# Patient Record
Sex: Female | Born: 1965
Health system: Southern US, Community
[De-identification: ages and names within clinical notes are randomized; demographics above are authoritative.]

## PROBLEM LIST (undated history)

## (undated) DIAGNOSIS — Z8719 Personal history of other diseases of the digestive system: Secondary | ICD-10-CM

## (undated) DIAGNOSIS — Z8489 Family history of other specified conditions: Secondary | ICD-10-CM

## (undated) DIAGNOSIS — Z9889 Other specified postprocedural states: Secondary | ICD-10-CM

## (undated) DIAGNOSIS — E782 Mixed hyperlipidemia: Secondary | ICD-10-CM

## (undated) DIAGNOSIS — J45909 Unspecified asthma, uncomplicated: Secondary | ICD-10-CM

## (undated) DIAGNOSIS — J189 Pneumonia, unspecified organism: Secondary | ICD-10-CM

## (undated) DIAGNOSIS — I10 Essential (primary) hypertension: Secondary | ICD-10-CM

## (undated) DIAGNOSIS — T8859XA Other complications of anesthesia, initial encounter: Secondary | ICD-10-CM

## (undated) DIAGNOSIS — J449 Chronic obstructive pulmonary disease, unspecified: Secondary | ICD-10-CM

## (undated) DIAGNOSIS — I517 Cardiomegaly: Secondary | ICD-10-CM

## (undated) DIAGNOSIS — Z85820 Personal history of malignant melanoma of skin: Secondary | ICD-10-CM

## (undated) DIAGNOSIS — G971 Other reaction to spinal and lumbar puncture: Secondary | ICD-10-CM

## (undated) DIAGNOSIS — R51 Headache: Secondary | ICD-10-CM

## (undated) DIAGNOSIS — G709 Myoneural disorder, unspecified: Secondary | ICD-10-CM

## (undated) DIAGNOSIS — M1990 Unspecified osteoarthritis, unspecified site: Secondary | ICD-10-CM

## (undated) DIAGNOSIS — T4145XA Adverse effect of unspecified anesthetic, initial encounter: Secondary | ICD-10-CM

## (undated) DIAGNOSIS — T883XXA Malignant hyperthermia due to anesthesia, initial encounter: Secondary | ICD-10-CM

## (undated) DIAGNOSIS — K219 Gastro-esophageal reflux disease without esophagitis: Secondary | ICD-10-CM

## (undated) DIAGNOSIS — R112 Nausea with vomiting, unspecified: Secondary | ICD-10-CM

## (undated) DIAGNOSIS — D649 Anemia, unspecified: Secondary | ICD-10-CM

## (undated) DIAGNOSIS — N390 Urinary tract infection, site not specified: Secondary | ICD-10-CM

## (undated) DIAGNOSIS — F419 Anxiety disorder, unspecified: Secondary | ICD-10-CM

## (undated) DIAGNOSIS — F411 Generalized anxiety disorder: Secondary | ICD-10-CM

## (undated) HISTORY — PX: POSTERIOR FUSION LUMBAR SPINE: SUR632

## (undated) HISTORY — PX: BREAST SURGERY: SHX581

## (undated) HISTORY — PX: TONSILLECTOMY: SUR1361

## (undated) HISTORY — PX: OTHER SURGICAL HISTORY: SHX169

## (undated) HISTORY — DX: Mixed hyperlipidemia: E78.2

## (undated) HISTORY — PX: BLADDER SUSPENSION: SHX72

## (undated) HISTORY — PX: TUBAL LIGATION: SHX77

## (undated) HISTORY — DX: Essential (primary) hypertension: I10

## (undated) HISTORY — DX: Generalized anxiety disorder: F41.1

## (undated) HISTORY — DX: Malignant hyperthermia due to anesthesia, initial encounter: T88.3XXA

## (undated) HISTORY — DX: Personal history of malignant melanoma of skin: Z85.820

---

## 2010-04-11 ENCOUNTER — Ambulatory Visit
Admission: RE | Admit: 2010-04-11 | Discharge: 2010-04-11 | Payer: Self-pay | Source: Home / Self Care | Attending: Thoracic Surgery | Admitting: Thoracic Surgery

## 2010-08-22 NOTE — Letter (Signed)
April 11, 2010   Kaitlyn Kuba, MD  [address]  [place], [place]  [address]   Re:  ODESTER, NILSON                 DOB:  01-08-1966   Dear Dr. Marina Goodell:   I saw the patient, this 45 year old patient has a long history of  smoking and since February of 2011, has had multiple episodes of  bronchitis and pneumonia.  She was admitted recently to [facility] with a left lingular pneumonia.  In September 2011, a CT scan  was done at [facility] which showed some nodularity in her thymus gland  and a 3-4 months thymus followup was recommended.  On the CT scan done  at [facility], there is really no mention made of the thymus  gland compared with the other CT scan that appeared to be the same there  maybe some slight nodularity, but it was really unchanged.  She quit  smoking recently and is starting to feel better and was recently been  put on prednisone which has made her some nervous and caused to increase  her appetite.  She saw Dr. Eulis Foster and was told that she had a early  stage chronic obstructive pulmonary disease.   Her medications include:  1.  Norvasc 10 mg a day.  2.  Xanax 0.5 b.i.d.  3.  Omeprazole 40 mg a day.  4.  Coreg 12.5 mg twice a day.  5.  Advair 250-50 one puff twice a day.  6.  Ventolin HFA 2 puffs every 4h..  7.  Prednisone 20 mg a day.  8.  Ferrous sulfate.  9.  Hydrochlorothiazide 12.5 mg a day.   ALLERGIES:  Codeine and amoxicillin.   FAMILY HISTORY:  Noncontributory.   SOCIAL HISTORY:  She is married, has 2 children, quit smoking 35 days  ago.  Does not drink alcohol on a regular basis.   REVIEW OF SYSTEMS:  VITAL SIGNS:  She is 181 pounds.  She is 5 feet, 2  inches.  GENERAL:  She had weight gain.  CARDIAC:  She has shortness of breath with exertion.  No angina or  atrial fibrillation.  PULMONARY:  See history of present illness.  No hemoptysis.  GI:  Reflux.  GU:  Frequent urination.  No kidney disease.  VASCULAR:   No claudication, DVT, or TIAs.  NEUROLOGICAL:  Headaches.  MUSCULOSKELETAL:  Arthritis.  PSYCHIATRIC:  Nervousness.  EYES/ENT:  No change in eyesight or hearing.  HEMATOLOGICAL:  Problems with bleeding, clotting disorders or anemia.   PHYSICAL EXAMINATION:  Vital Signs:  Her blood pressure is 125/78, pulse  78, respirations 18, sats were 98%.  Head, Eyes, Ears, Nose and Throat:  Unremarkable.  She has no supraclavicular or axillary adenopathy.  No  thyromegaly.  Chest:  Clear to auscultation and percussion.  Heart:  Regular sinus rhythm.  No murmurs.  Abdomen:  Soft.  No  hepatosplenomegaly.  Extremities:  Pulses are 2+.  There is no clubbing  or edema.  Neurologic:  She is oriented x3.  Sensory and motor intact.  Cranial nerves intact.   Ines Bloomer, M.D.  Electronically Signed   DPB/MEDQ  D:  04/11/2010  T:  04/12/2010  Job:  [id number]

## 2010-09-27 ENCOUNTER — Other Ambulatory Visit: Payer: Self-pay | Admitting: Thoracic Surgery

## 2010-09-27 DIAGNOSIS — R222 Localized swelling, mass and lump, trunk: Secondary | ICD-10-CM

## 2010-09-27 DIAGNOSIS — J9859 Other diseases of mediastinum, not elsewhere classified: Secondary | ICD-10-CM

## 2010-10-25 ENCOUNTER — Inpatient Hospital Stay: Admission: RE | Admit: 2010-10-25 | Payer: Self-pay | Source: Ambulatory Visit

## 2010-10-25 ENCOUNTER — Ambulatory Visit: Payer: Self-pay | Admitting: Thoracic Surgery

## 2010-12-27 ENCOUNTER — Other Ambulatory Visit: Payer: Self-pay | Admitting: Thoracic Surgery

## 2011-01-15 ENCOUNTER — Encounter: Payer: Self-pay | Admitting: Thoracic Surgery

## 2011-01-16 ENCOUNTER — Ambulatory Visit: Payer: BC Managed Care – PPO | Admitting: Thoracic Surgery

## 2011-01-16 ENCOUNTER — Ambulatory Visit: Admission: RE | Admit: 2011-01-16 | Payer: BC Managed Care – PPO | Source: Ambulatory Visit

## 2011-07-25 ENCOUNTER — Encounter (HOSPITAL_COMMUNITY)
Admission: RE | Admit: 2011-07-25 | Discharge: 2011-07-25 | Disposition: A | Discharge status: Home / Self Care | Payer: BC Managed Care – PPO | Source: Ambulatory Visit | Attending: Neurosurgery | Admitting: Neurosurgery

## 2011-07-25 ENCOUNTER — Encounter (HOSPITAL_COMMUNITY): Payer: Self-pay

## 2011-07-25 ENCOUNTER — Encounter (HOSPITAL_COMMUNITY)
Admission: RE | Admit: 2011-07-25 | Discharge: 2011-07-25 | Disposition: A | Discharge status: Home / Self Care | Payer: BC Managed Care – PPO | Source: Ambulatory Visit | Attending: Anesthesiology | Admitting: Anesthesiology

## 2011-07-25 ENCOUNTER — Encounter (HOSPITAL_COMMUNITY): Payer: Self-pay | Admitting: Vascular Surgery

## 2011-07-25 HISTORY — DX: Headache: R51

## 2011-07-25 HISTORY — DX: Myoneural disorder, unspecified: G70.9

## 2011-07-25 HISTORY — DX: Adverse effect of unspecified anesthetic, initial encounter: T41.45XA

## 2011-07-25 HISTORY — DX: Unspecified osteoarthritis, unspecified site: M19.90

## 2011-07-25 HISTORY — DX: Other complications of anesthesia, initial encounter: T88.59XA

## 2011-07-25 HISTORY — DX: Essential (primary) hypertension: I10

## 2011-07-25 HISTORY — DX: Other specified postprocedural states: R11.2

## 2011-07-25 HISTORY — DX: Personal history of other diseases of the digestive system: Z87.19

## 2011-07-25 HISTORY — DX: Gastro-esophageal reflux disease without esophagitis: K21.9

## 2011-07-25 HISTORY — DX: Nausea with vomiting, unspecified: Z98.890

## 2011-07-25 HISTORY — DX: Urinary tract infection, site not specified: N39.0

## 2011-07-25 HISTORY — DX: Anxiety disorder, unspecified: F41.9

## 2011-07-25 HISTORY — DX: Pneumonia, unspecified organism: J18.9

## 2011-07-25 HISTORY — DX: Cardiomegaly: I51.7

## 2011-07-25 HISTORY — DX: Anemia, unspecified: D64.9

## 2011-07-25 LAB — HCG, SERUM, QUALITATIVE: Preg, Serum: NEGATIVE

## 2011-07-25 LAB — CBC
HCT: 39.1 % (ref 36.0–46.0)
Hemoglobin: 13.1 g/dL (ref 12.0–15.0)
MCH: 28.7 pg (ref 26.0–34.0)
MCHC: 33.5 g/dL (ref 30.0–36.0)
MCV: 85.6 fL (ref 78.0–100.0)
Platelets: 342 10*3/uL (ref 150–400)
RBC: 4.57 MIL/uL (ref 3.87–5.11)
RDW: 17.3 % — ABNORMAL HIGH (ref 11.5–15.5)
WBC: 7 10*3/uL (ref 4.0–10.5)

## 2011-07-25 LAB — BASIC METABOLIC PANEL
BUN: 10 mg/dL (ref 6–23)
CO2: 22 mEq/L (ref 19–32)
Calcium: 9.9 mg/dL (ref 8.4–10.5)
Chloride: 101 mEq/L (ref 96–112)
Creatinine, Ser: 0.56 mg/dL (ref 0.50–1.10)
GFR calc Af Amer: >90 mL/min (ref >90)
GFR calc non Af Amer: >90 mL/min (ref >90)
Glucose, Bld: 89 mg/dL (ref 70–99)
Potassium: 4.5 mEq/L (ref 3.5–5.1)
Sodium: 136 mEq/L (ref 135–145)

## 2011-07-25 LAB — ABO/RH: ABO/RH(D): B NEG

## 2011-07-25 LAB — TYPE AND SCREEN
ABO/RH(D): B NEG
Antibody Screen: NEGATIVE

## 2011-07-25 LAB — SURGICAL PCR SCREEN
MRSA, PCR: NEGATIVE
Staphylococcus aureus: NEGATIVE

## 2011-07-25 NOTE — Pre-Procedure Instructions (Signed)
20 Kaitlyn Diaz  07/25/2011   Your procedure is scheduled on:  Wednesday August 01, 2011  Report to [facility] at 0630 AM.  Call this number if you have problems the morning of surgery: [phone]   Remember:   Do not eat food:After Midnight.  May have clear liquids: up to 4 Hours before arrival. (up to 2:30AM)  Clear liquids include soda, tea, black coffee, apple or grape juice, broth.  Take these medicines the morning of surgery with A SIP OF WATER: Albuterol, xanax, amlodipine, coreg, gabapentin, advair, hydrocodone, prilosec   Do not wear jewelry, make-up or nail polish.  Do not wear lotions, powders, or perfumes. You may wear deodorant.  Do not shave 48 hours prior to surgery.  Do not bring valuables to the hospital.  Contacts, dentures or bridgework may not be worn into surgery.  Leave suitcase in the car. After surgery it may be brought to your room.  For patients admitted to the hospital, checkout time is 11:00 AM the day of discharge.   Patients discharged the day of surgery will not be allowed to drive home.  Name and phone number of your driver: Anahita Cua [phone]  Special Instructions: CHG Shower Use Special Wash: 1/2 bottle night before surgery and 1/2 bottle morning of surgery.   Please read over the following fact sheets that you were given: Pain Booklet, Coughing and Deep Breathing, MRSA Information and Surgical Site Infection Prevention

## 2011-07-25 NOTE — Consult Note (Addendum)
Anesthesia:  Patient is a 46 year old female scheduled for a L4-5 PLIF on 08/01/11 @ 0830 (first case).  History includes HTN, hypertensive cardiomyopathy associated with pregnancy induced HTN/pre-eclampsia 20 years ago, anemia, GERD, headaches, arthritis, HH, anxiety, smoking, PNA (last 2011), neuropathy involving her LLE. She has had prior T&A, breast surgery, and BTL.  Her PCP is Syble Creek, ANP at [facility] in [place], who saw her for a follow-up and preoperative evaluation on 06/28/11.  Anesthesia complications include personal and family history of MALIGNANT HYPERTHERMIA and severe  post-operative N/V.  She has had a maternal uncle die from complications of MH, and both her mother and sister have a MH history.  None have been formally tested, but she said over twenty years ago she had a hard surgery with a failed block that required GA and her temperature went up to 106.  She also says that she woke up during her tonsillectomy at age 89 and during a "sling" procedure (Urologist Dr. Lindley Magnus) in [facility] in 2010.    Physical exam findings show a raspy voice, average build, heart with a RRR, no murmur noted.  Lungs clear. No carotid bruits or significant LE edema noted.  She denies CP, SOB.  She reports that about 1 year after her cardiomyopathy was diagnosed she had a follow-up echo that was "normal".  She denies CHF history, and has tolerated several surgical procedures since.  Her activity level is very limited for the past 6-12 months due to her back and leg pain.   EKG from PCP office on 06/28/11 showed NSR.  Labs acceptable.  CXR on 07/24/16 showed no acute cardiopulmonary process. She was evaluated by Pulmonologist Dr. Bethanie Dicker in December 2011.  PFTs were done and did not sure clearcut evidence of obstructive or restrictive defect.  (FEV1 2.45 with a predicted value of 93%.)  I updated Anesthesiologist Dr. Gypsy Balsam about her Anesthesia and CM history.  Plan to proceed.  I  will attempt to get her last Anesthesia records and will notify OR scheduling of her MH history.  Addendum: 07/26/11 1030  I notified Mary in [place] scheduling and Isidore Moos, CRNA of patient's history of MH.  Our Diplomatic Services operational officer has requested her last Anesthesia records from [facility].

## 2011-07-25 NOTE — Progress Notes (Signed)
Contacted Dr. Orion Crook office, requested copy of last office visit notes and EKG. [phone]. Called Dr. Gerome Apley with [facility], requested last office visit note and CXR, spoke with [facility] in medical records.

## 2011-07-26 NOTE — Anesthesia Preprocedure Evaluation (Addendum)
Anesthesia Evaluation  Patient identified by MRN, date of birth, ID band Patient awake  General Assessment Comment:Personal and family history of MH  Reviewed: Allergy & Precautions, H&P , NPO status , Patient's Chart, lab work & pertinent test results, reviewed documented beta blocker date and time   History of Anesthesia Complications (+) PONV, MALIGNANT HYPERTHERMIA and Family history of anesthesia reaction  Airway Mallampati: II TM Distance: >3 FB Neck ROM: Full    Dental  (+) Upper Dentures and Dental Advisory Given   Pulmonary pneumonia , Current Smoker,    Pulmonary exam normal       Cardiovascular hypertension, Pt. on medications and Pt. on home beta blockers     Neuro/Psych Anxiety    GI/Hepatic Neg liver ROS, hiatal hernia, GERD-  Medicated and Controlled,  Endo/Other  negative endocrine ROS  Renal/GU negative Renal ROS     Musculoskeletal negative musculoskeletal ROS (+)   Abdominal Normal abdominal exam  (+)   Peds  Hematology negative hematology ROS (+)   Anesthesia Other Findings   Reproductive/Obstetrics                         Anesthesia Physical Anesthesia Plan  ASA: III  Anesthesia Plan: General   Post-op Pain Management:    Induction: Intravenous  Airway Management Planned: Oral ETT  Additional Equipment:   Intra-op Plan:   Post-operative Plan: Extubation in OR  Informed Consent: I have reviewed the patients History and Physical, chart, labs and discussed the procedure including the risks, benefits and alternatives for the proposed anesthesia with the patient or authorized representative who has indicated his/her understanding and acceptance.   Dental advisory given  Plan Discussed with: Anesthesiologist and Surgeon  Anesthesia Plan Comments: (Pt has a grandmother and her great uncle with  A H/O MH episode. Nobody in family tested. She had and anesthetic last  year in [place] where she received Succinyl Choline and Sevoflurane without triggering MH. Plan: Avoid Succinyl Choline, IV propofol infusion. Background Sevoflurane, if needed.)       Anesthesia Quick Evaluation

## 2011-07-31 MED ORDER — VANCOMYCIN HCL 500 MG IV SOLR
500.0000 mg | Freq: Once | INTRAVENOUS | Status: AC
  Administered 2011-08-01: 500 mg via INTRAVENOUS
  Filled 2011-07-31: qty 500

## 2011-08-01 ENCOUNTER — Encounter (HOSPITAL_COMMUNITY): Payer: Self-pay | Admitting: Vascular Surgery

## 2011-08-01 ENCOUNTER — Ambulatory Visit (HOSPITAL_COMMUNITY): Payer: BC Managed Care – PPO | Admitting: Vascular Surgery

## 2011-08-01 ENCOUNTER — Inpatient Hospital Stay (HOSPITAL_COMMUNITY)
Admission: RE | Admit: 2011-08-01 | Discharge: 2011-08-05 | DRG: 755 | Disposition: A | Discharge status: Home / Self Care | Payer: BC Managed Care – PPO | Source: Ambulatory Visit | Attending: Neurosurgery | Admitting: Neurosurgery

## 2011-08-01 ENCOUNTER — Ambulatory Visit (HOSPITAL_COMMUNITY): Payer: BC Managed Care – PPO

## 2011-08-01 ENCOUNTER — Encounter (HOSPITAL_COMMUNITY): Payer: Self-pay | Admitting: *Deleted

## 2011-08-01 ENCOUNTER — Encounter (HOSPITAL_COMMUNITY)
Admission: RE | Disposition: A | Discharge status: Home / Self Care | Payer: Self-pay | Source: Ambulatory Visit | Attending: Neurosurgery

## 2011-08-01 DIAGNOSIS — M4316 Spondylolisthesis, lumbar region: Secondary | ICD-10-CM

## 2011-08-01 DIAGNOSIS — Z79899 Other long term (current) drug therapy: Secondary | ICD-10-CM

## 2011-08-01 DIAGNOSIS — F411 Generalized anxiety disorder: Secondary | ICD-10-CM | POA: Diagnosis present

## 2011-08-01 DIAGNOSIS — D649 Anemia, unspecified: Secondary | ICD-10-CM | POA: Diagnosis present

## 2011-08-01 DIAGNOSIS — F17200 Nicotine dependence, unspecified, uncomplicated: Secondary | ICD-10-CM | POA: Diagnosis present

## 2011-08-01 DIAGNOSIS — I10 Essential (primary) hypertension: Secondary | ICD-10-CM | POA: Diagnosis present

## 2011-08-01 DIAGNOSIS — Z01818 Encounter for other preprocedural examination: Secondary | ICD-10-CM

## 2011-08-01 DIAGNOSIS — M129 Arthropathy, unspecified: Secondary | ICD-10-CM | POA: Diagnosis present

## 2011-08-01 DIAGNOSIS — Q762 Congenital spondylolisthesis: Principal | ICD-10-CM

## 2011-08-01 DIAGNOSIS — E876 Hypokalemia: Secondary | ICD-10-CM | POA: Diagnosis not present

## 2011-08-01 DIAGNOSIS — K219 Gastro-esophageal reflux disease without esophagitis: Secondary | ICD-10-CM | POA: Diagnosis present

## 2011-08-01 DIAGNOSIS — K449 Diaphragmatic hernia without obstruction or gangrene: Secondary | ICD-10-CM | POA: Diagnosis present

## 2011-08-01 DIAGNOSIS — Z01812 Encounter for preprocedural laboratory examination: Secondary | ICD-10-CM

## 2011-08-01 SURGERY — POSTERIOR LUMBAR FUSION 1 LEVEL
Anesthesia: General | Site: Back | Laterality: Bilateral | Wound class: Clean

## 2011-08-01 MED ORDER — GLYCOPYRROLATE 0.2 MG/ML IJ SOLN
INTRAMUSCULAR | Status: DC | PRN
  Administered 2011-08-01: .7 mg via INTRAVENOUS

## 2011-08-01 MED ORDER — SODIUM CHLORIDE 0.9 % IR SOLN
Status: DC | PRN
  Administered 2011-08-01: 09:00:00

## 2011-08-01 MED ORDER — CEFAZOLIN SODIUM 1-5 GM-% IV SOLN: 1.0000 g | Freq: Three times a day (TID) | INTRAVENOUS | Status: DC

## 2011-08-01 MED ORDER — PHENOL 1.4 % MT LIQD: 1.0000 | OROMUCOSAL | Status: DC | PRN

## 2011-08-01 MED ORDER — ONDANSETRON HCL 4 MG/2ML IJ SOLN: 4.0000 mg | Freq: Once | INTRAMUSCULAR | Status: DC | PRN

## 2011-08-01 MED ORDER — SODIUM CHLORIDE 0.9 % IJ SOLN
3.0000 mL | Freq: Two times a day (BID) | INTRAMUSCULAR | Status: DC
  Administered 2011-08-01 – 2011-08-05 (×7): 3 mL via INTRAVENOUS

## 2011-08-01 MED ORDER — NICOTINE 14 MG/24HR TD PT24
14.0000 mg | MEDICATED_PATCH | Freq: Every day | TRANSDERMAL | Status: DC
  Administered 2011-08-01 – 2011-08-05 (×5): 14 mg via TRANSDERMAL
  Filled 2011-08-01 (×5): qty 1

## 2011-08-01 MED ORDER — LIDOCAINE HCL (CARDIAC) 20 MG/ML IV SOLN
INTRAVENOUS | Status: DC | PRN
  Administered 2011-08-01: 100 mg via INTRAVENOUS

## 2011-08-01 MED ORDER — SODIUM CHLORIDE 0.9 % IV SOLN
INTRAVENOUS | Status: AC
  Filled 2011-08-01: qty 500

## 2011-08-01 MED ORDER — PROPOFOL 10 MG/ML IV EMUL
INTRAVENOUS | Status: DC | PRN
  Administered 2011-08-01 (×5): via INTRAVENOUS
  Administered 2011-08-01: 120 ug/kg/min via INTRAVENOUS

## 2011-08-01 MED ORDER — 0.9 % SODIUM CHLORIDE (POUR BTL) OPTIME
TOPICAL | Status: DC | PRN
  Administered 2011-08-01: 1000 mL

## 2011-08-01 MED ORDER — ACETAMINOPHEN 650 MG RE SUPP: 650.0000 mg | RECTAL | Status: DC | PRN

## 2011-08-01 MED ORDER — FLUTICASONE-SALMETEROL 250-50 MCG/DOSE IN AEPB
1.0000 | INHALATION_SPRAY | Freq: Every day | RESPIRATORY_TRACT | Status: DC
  Administered 2011-08-01 – 2011-08-04 (×4): 1 via RESPIRATORY_TRACT
  Filled 2011-08-01: qty 14

## 2011-08-01 MED ORDER — MIDAZOLAM HCL 5 MG/5ML IJ SOLN
INTRAMUSCULAR | Status: DC | PRN
  Administered 2011-08-01 (×3): 2 mg via INTRAVENOUS

## 2011-08-01 MED ORDER — ACETAMINOPHEN 325 MG PO TABS: 650.0000 mg | ORAL_TABLET | ORAL | Status: DC | PRN

## 2011-08-01 MED ORDER — GABAPENTIN 300 MG PO CAPS: 600.0000 mg | ORAL_CAPSULE | Freq: Every day | ORAL | Status: DC

## 2011-08-01 MED ORDER — FENTANYL CITRATE 0.05 MG/ML IJ SOLN
INTRAMUSCULAR | Status: DC | PRN
  Administered 2011-08-01 (×2): 50 ug via INTRAVENOUS
  Administered 2011-08-01 (×2): 250 ug via INTRAVENOUS
  Administered 2011-08-01 (×2): 50 ug via INTRAVENOUS

## 2011-08-01 MED ORDER — LACTATED RINGERS IV SOLN
INTRAVENOUS | Status: DC | PRN
  Administered 2011-08-01 (×3): via INTRAVENOUS

## 2011-08-01 MED ORDER — AMLODIPINE BESYLATE 10 MG PO TABS
10.0000 mg | ORAL_TABLET | Freq: Every day | ORAL | Status: DC
  Administered 2011-08-04 – 2011-08-05 (×2): 10 mg via ORAL
  Filled 2011-08-01 (×4): qty 1

## 2011-08-01 MED ORDER — VECURONIUM BROMIDE 10 MG IV SOLR
INTRAVENOUS | Status: DC | PRN
  Administered 2011-08-01: 5 mg via INTRAVENOUS
  Administered 2011-08-01: 1 mg via INTRAVENOUS
  Administered 2011-08-01: 2 mg via INTRAVENOUS

## 2011-08-01 MED ORDER — ROCURONIUM BROMIDE 100 MG/10ML IV SOLN
INTRAVENOUS | Status: DC | PRN
  Administered 2011-08-01: 50 mg via INTRAVENOUS

## 2011-08-01 MED ORDER — HYDROMORPHONE HCL PF 1 MG/ML IJ SOLN
0.2500 mg | INTRAMUSCULAR | Status: DC | PRN
  Administered 2011-08-01 (×2): 0.5 mg via INTRAVENOUS

## 2011-08-01 MED ORDER — ALUM & MAG HYDROXIDE-SIMETH 200-200-20 MG/5ML PO SUSP: 30.0000 mL | Freq: Four times a day (QID) | ORAL | Status: DC | PRN

## 2011-08-01 MED ORDER — NEOSTIGMINE METHYLSULFATE 1 MG/ML IJ SOLN
INTRAMUSCULAR | Status: DC | PRN
  Administered 2011-08-01: 4 mg via INTRAVENOUS

## 2011-08-01 MED ORDER — ALBUTEROL SULFATE (5 MG/ML) 0.5% IN NEBU
INHALATION_SOLUTION | RESPIRATORY_TRACT | Status: AC
  Filled 2011-08-01: qty 0.5

## 2011-08-01 MED ORDER — HYDROMORPHONE HCL PF 1 MG/ML IJ SOLN
0.5000 mg | INTRAMUSCULAR | Status: DC | PRN
  Administered 2011-08-01 – 2011-08-04 (×6): 1 mg via INTRAVENOUS
  Filled 2011-08-01 (×7): qty 1

## 2011-08-01 MED ORDER — SODIUM CHLORIDE 0.9 % IJ SOLN: 3.0000 mL | INTRAMUSCULAR | Status: DC | PRN

## 2011-08-01 MED ORDER — SODIUM CHLORIDE 0.9 % IV SOLN: 250.0000 mL | INTRAVENOUS | Status: DC

## 2011-08-01 MED ORDER — ALBUTEROL SULFATE HFA 108 (90 BASE) MCG/ACT IN AERS
INHALATION_SPRAY | RESPIRATORY_TRACT | Status: DC | PRN
  Administered 2011-08-01: 2 via RESPIRATORY_TRACT

## 2011-08-01 MED ORDER — HYDROCODONE-ACETAMINOPHEN 5-325 MG PO TABS
2.0000 | ORAL_TABLET | ORAL | Status: DC | PRN
  Administered 2011-08-01 – 2011-08-05 (×14): 2 via ORAL
  Filled 2011-08-01 (×14): qty 2

## 2011-08-01 MED ORDER — CYCLOBENZAPRINE HCL 10 MG PO TABS
10.0000 mg | ORAL_TABLET | Freq: Three times a day (TID) | ORAL | Status: DC | PRN
  Administered 2011-08-01 – 2011-08-04 (×4): 10 mg via ORAL
  Filled 2011-08-01 (×7): qty 1

## 2011-08-01 MED ORDER — FERROUS SULFATE 325 (65 FE) MG PO TABS
325.0000 mg | ORAL_TABLET | Freq: Two times a day (BID) | ORAL | Status: DC
  Administered 2011-08-01 – 2011-08-05 (×8): 325 mg via ORAL
  Filled 2011-08-01 (×10): qty 1

## 2011-08-01 MED ORDER — ONDANSETRON HCL 4 MG/2ML IJ SOLN: 4.0000 mg | INTRAMUSCULAR | Status: DC | PRN

## 2011-08-01 MED ORDER — DEXAMETHASONE SODIUM PHOSPHATE 4 MG/ML IJ SOLN
INTRAMUSCULAR | Status: DC | PRN
  Administered 2011-08-01: 4 mg via INTRAVENOUS

## 2011-08-01 MED ORDER — MENTHOL 3 MG MT LOZG
1.0000 | LOZENGE | OROMUCOSAL | Status: DC | PRN
  Filled 2011-08-01: qty 9

## 2011-08-01 MED ORDER — BUPIVACAINE HCL (PF) 0.25 % IJ SOLN
INTRAMUSCULAR | Status: DC | PRN
  Administered 2011-08-01: 10 mL

## 2011-08-01 MED ORDER — LIDOCAINE-EPINEPHRINE 1 %-1:100000 IJ SOLN
INTRAMUSCULAR | Status: DC | PRN
  Administered 2011-08-01: 10 mL

## 2011-08-01 MED ORDER — HYDROCHLOROTHIAZIDE 12.5 MG PO CAPS
12.5000 mg | ORAL_CAPSULE | Freq: Every day | ORAL | Status: DC
  Administered 2011-08-04 – 2011-08-05 (×2): 12.5 mg via ORAL
  Filled 2011-08-01 (×5): qty 1

## 2011-08-01 MED ORDER — PROPOFOL 10 MG/ML IV EMUL
INTRAVENOUS | Status: DC | PRN
  Administered 2011-08-01: 150 mg via INTRAVENOUS

## 2011-08-01 MED ORDER — BACITRACIN 50000 UNITS IM SOLR
INTRAMUSCULAR | Status: AC
  Filled 2011-08-01: qty 1

## 2011-08-01 MED ORDER — MORPHINE SULFATE 2 MG/ML IJ SOLN: 0.0500 mg/kg | INTRAMUSCULAR | Status: DC | PRN

## 2011-08-01 MED ORDER — GABAPENTIN 600 MG PO TABS
600.0000 mg | ORAL_TABLET | Freq: Every day | ORAL | Status: DC
  Administered 2011-08-01 – 2011-08-04 (×4): 600 mg via ORAL
  Filled 2011-08-01 (×5): qty 1

## 2011-08-01 MED ORDER — POTASSIUM CHLORIDE IN NACL 20-0.9 MEQ/L-% IV SOLN
INTRAVENOUS | Status: DC
  Administered 2011-08-01 – 2011-08-02 (×2): via INTRAVENOUS
  Filled 2011-08-01 (×9): qty 1000

## 2011-08-01 MED ORDER — ALPRAZOLAM 0.5 MG PO TABS
1.0000 mg | ORAL_TABLET | Freq: Three times a day (TID) | ORAL | Status: DC
  Administered 2011-08-01 – 2011-08-05 (×12): 1 mg via ORAL
  Filled 2011-08-01: qty 2
  Filled 2011-08-01 (×2): qty 1
  Filled 2011-08-01: qty 2
  Filled 2011-08-01: qty 1
  Filled 2011-08-01: qty 2
  Filled 2011-08-01: qty 1
  Filled 2011-08-01 (×2): qty 2
  Filled 2011-08-01 (×2): qty 1
  Filled 2011-08-01: qty 2
  Filled 2011-08-01: qty 1
  Filled 2011-08-01: qty 2
  Filled 2011-08-01 (×3): qty 1

## 2011-08-01 MED ORDER — HYDROMORPHONE HCL PF 1 MG/ML IJ SOLN
INTRAMUSCULAR | Status: AC
  Filled 2011-08-01: qty 1

## 2011-08-01 MED ORDER — CARVEDILOL 12.5 MG PO TABS
12.5000 mg | ORAL_TABLET | Freq: Two times a day (BID) | ORAL | Status: DC
  Administered 2011-08-03 – 2011-08-05 (×4): 12.5 mg via ORAL
  Filled 2011-08-01 (×10): qty 1

## 2011-08-01 MED ORDER — DOCUSATE SODIUM 100 MG PO CAPS
100.0000 mg | ORAL_CAPSULE | Freq: Two times a day (BID) | ORAL | Status: DC
  Administered 2011-08-01 – 2011-08-05 (×8): 100 mg via ORAL
  Filled 2011-08-01 (×7): qty 1

## 2011-08-01 MED ORDER — ONDANSETRON HCL 4 MG/2ML IJ SOLN
INTRAMUSCULAR | Status: DC | PRN
  Administered 2011-08-01 (×2): 4 mg via INTRAVENOUS

## 2011-08-01 MED ORDER — ALBUTEROL SULFATE HFA 108 (90 BASE) MCG/ACT IN AERS
1.0000 | INHALATION_SPRAY | Freq: Three times a day (TID) | RESPIRATORY_TRACT | Status: DC | PRN
  Filled 2011-08-01: qty 6.7

## 2011-08-01 MED ORDER — SURGIFOAM 100 EX MISC
CUTANEOUS | Status: DC | PRN
  Administered 2011-08-01: 09:00:00 via TOPICAL

## 2011-08-01 MED ORDER — VANCOMYCIN HCL IN DEXTROSE 1-5 GM/200ML-% IV SOLN
1000.0000 mg | Freq: Once | INTRAVENOUS | Status: AC
  Administered 2011-08-01: 1000 mg via INTRAVENOUS
  Filled 2011-08-01: qty 200

## 2011-08-01 MED ORDER — PANTOPRAZOLE SODIUM 40 MG PO TBEC
40.0000 mg | DELAYED_RELEASE_TABLET | Freq: Every day | ORAL | Status: DC
Start: 2011-08-01 — End: 2011-08-05
  Administered 2011-08-01 – 2011-08-04 (×4): 40 mg via ORAL
  Filled 2011-08-01 (×3): qty 1

## 2011-08-01 SURGICAL SUPPLY — 70 items
BAG DECANTER FOR FLEXI CONT (MISCELLANEOUS) ×2 IMPLANT
BENZOIN TINCTURE PRP APPL 2/3 (GAUZE/BANDAGES/DRESSINGS) ×2 IMPLANT
BLADE SURG 11 STRL SS (BLADE) ×2 IMPLANT
BLADE SURG ROTATE 9660 (MISCELLANEOUS) IMPLANT
BRUSH SCRUB EZ PLAIN DRY (MISCELLANEOUS) ×2 IMPLANT
BUR MATCHSTICK NEURO 3.0 LAGG (BURR) ×2 IMPLANT
BUR PRECISION FLUTE 6.0 (BURR) ×2 IMPLANT
CANISTER SUCTION 2500CC (MISCELLANEOUS) ×2 IMPLANT
CAP LOCKING REVERE (Cap) ×8 IMPLANT
CLOTH BEACON ORANGE TIMEOUT ST (SAFETY) ×2 IMPLANT
CONT SPEC 4OZ CLIKSEAL STRL BL (MISCELLANEOUS) ×4 IMPLANT
COVER BACK TABLE 24X17X13 BIG (DRAPES) IMPLANT
COVER TABLE BACK 60X90 (DRAPES) ×2 IMPLANT
Crosslink  48 - 60 mm ×2 IMPLANT
DECANTER SPIKE VIAL GLASS SM (MISCELLANEOUS) ×2 IMPLANT
DERMABOND ADVANCED (GAUZE/BANDAGES/DRESSINGS) ×1
DERMABOND ADVANCED .7 DNX12 (GAUZE/BANDAGES/DRESSINGS) ×1 IMPLANT
DRAPE C-ARM 42X72 X-RAY (DRAPES) ×6 IMPLANT
DRAPE LAPAROTOMY 100X72X124 (DRAPES) ×2 IMPLANT
DRAPE POUCH INSTRU U-SHP 10X18 (DRAPES) ×2 IMPLANT
DRAPE PROXIMA HALF (DRAPES) IMPLANT
DRAPE SURG 17X23 STRL (DRAPES) ×2 IMPLANT
DRSG OPSITE 4X5.5 SM (GAUZE/BANDAGES/DRESSINGS) ×4 IMPLANT
ELECT REM PT RETURN 9FT ADLT (ELECTROSURGICAL) ×2
ELECTRODE REM PT RTRN 9FT ADLT (ELECTROSURGICAL) ×1 IMPLANT
EVACUATOR 3/16  PVC DRAIN (DRAIN) ×1
EVACUATOR 3/16 PVC DRAIN (DRAIN) ×1 IMPLANT
GAUZE SPONGE 4X4 16PLY XRAY LF (GAUZE/BANDAGES/DRESSINGS) ×2 IMPLANT
GLOVE BIO SURGEON STRL SZ8 (GLOVE) ×4 IMPLANT
GLOVE BIOGEL PI IND STRL 7.0 (GLOVE) ×1 IMPLANT
GLOVE BIOGEL PI IND STRL 8 (GLOVE) ×1 IMPLANT
GLOVE BIOGEL PI INDICATOR 7.0 (GLOVE) ×1
GLOVE BIOGEL PI INDICATOR 8 (GLOVE) ×1
GLOVE ECLIPSE 7.5 STRL STRAW (GLOVE) ×2 IMPLANT
GLOVE EXAM NITRILE LRG STRL (GLOVE) IMPLANT
GLOVE EXAM NITRILE MD LF STRL (GLOVE) ×4 IMPLANT
GLOVE EXAM NITRILE XL STR (GLOVE) IMPLANT
GLOVE EXAM NITRILE XS STR PU (GLOVE) IMPLANT
GLOVE INDICATOR 8.5 STRL (GLOVE) ×4 IMPLANT
GLOVE SS BIOGEL STRL SZ 6.5 (GLOVE) ×2 IMPLANT
GLOVE SUPERSENSE BIOGEL SZ 6.5 (GLOVE) ×2
GLOVE SURG SS PI 6.5 STRL IVOR (GLOVE) ×4 IMPLANT
GOWN BRE IMP SLV AUR LG STRL (GOWN DISPOSABLE) ×2 IMPLANT
GOWN BRE IMP SLV AUR XL STRL (GOWN DISPOSABLE) ×6 IMPLANT
GOWN STRL REIN 2XL LVL4 (GOWN DISPOSABLE) IMPLANT
KIT BASIN OR (CUSTOM PROCEDURE TRAY) ×2 IMPLANT
KIT ROOM TURNOVER OR (KITS) ×2 IMPLANT
MILL MEDIUM DISP (BLADE) ×2 IMPLANT
NEEDLE HYPO 25X1 1.5 SAFETY (NEEDLE) ×2 IMPLANT
NS IRRIG 1000ML POUR BTL (IV SOLUTION) ×2 IMPLANT
PACK LAMINECTOMY NEURO (CUSTOM PROCEDURE TRAY) ×2 IMPLANT
PAD ARMBOARD 7.5X6 YLW CONV (MISCELLANEOUS) ×6 IMPLANT
PUTTY BONE DBX 5CC MIX (Putty) ×2 IMPLANT
ROD CURVED 5.5X40MM (Rod) ×4 IMPLANT
SCREW PEDICLE 6.5X40MM (Screw) ×8 IMPLANT
SPACER CALIBER 10X22MM 11-15MM (Spacer) ×4 IMPLANT
SPONGE GAUZE 4X4 12PLY (GAUZE/BANDAGES/DRESSINGS) ×2 IMPLANT
SPONGE LAP 4X18 X RAY DECT (DISPOSABLE) IMPLANT
SPONGE SURGIFOAM ABS GEL 100 (HEMOSTASIS) ×2 IMPLANT
STRIP CLOSURE SKIN 1/2X4 (GAUZE/BANDAGES/DRESSINGS) ×2 IMPLANT
SUT VIC AB 0 CT1 18XCR BRD8 (SUTURE) ×1 IMPLANT
SUT VIC AB 0 CT1 8-18 (SUTURE) ×1
SUT VIC AB 2-0 CT1 18 (SUTURE) ×2 IMPLANT
SUT VIC AB 3-0 SH 8-18 (SUTURE) ×2 IMPLANT
SUT VICRYL 4-0 PS2 18IN ABS (SUTURE) ×2 IMPLANT
SYR 20ML ECCENTRIC (SYRINGE) ×2 IMPLANT
TOWEL OR 17X24 6PK STRL BLUE (TOWEL DISPOSABLE) ×2 IMPLANT
TOWEL OR 17X26 10 PK STRL BLUE (TOWEL DISPOSABLE) ×2 IMPLANT
TRAY FOLEY CATH 14FRSI W/METER (CATHETERS) ×2 IMPLANT
WATER STERILE IRR 1000ML POUR (IV SOLUTION) ×2 IMPLANT

## 2011-08-01 NOTE — Preoperative (Signed)
Beta Blockers   Reason not to administer Beta Blockers:Not Applicable, took coreg this am 

## 2011-08-01 NOTE — Op Note (Signed)
Preoperative diagnosis: Grade 1 spondylolisthesis and lumbar spinal stenosis L4-5 with bilateral L4 radiculopathy and L4-5 instability  Postoperative diagnosis: Same  Procedure: Gill decompressive laminectomy at L4-5 posterior lumbar interbody fusion L4-5 using a caliber expandable peek cages pedicle screw fixation L4-5 using the globus Revere 5.5 pedicle screw system posterior lateral arthrodesis L4-5 using local graft mixed with DBX, and open reduction spinal deformity L4-5 and placement of a large Hemovac drain.  Surgeon: Jillyn Hidden Zymeir Salminen  Assistant: Shirlean Kelly  Anesthesia: Gen.  EBL: Minimal  History of present illness patient is a very pleasant 46 year old female is a progress worsening back and left greater right leg pain rating at L4 and L5 nerve root pattern. She had unrelenting pain that was refractory to all forms of conservative treatment with anti-inflammatories physical therapy epidural steroid injection narcotic pain management. Imaging findings showed transitional anatomy where a call the additional segment L5-S1 with a rheumatoid disc the disc space or above at L4-5 had marked facet arthropathy diastased so the facets and severe lumbar spinal stenosis that level as well as a dynamic spondylolisthesis. Into the patient's failure conservative treatment imaging findings and progression of the operation were signed the patient as well as peri-operative Course, expectations of outcome, alternatives to surgery and she understood and agreed to proceed forward.  Operative procedure: Patient was brought to the or was induced under general anesthesia and positioned prone on the Wilson frame her back was prepped and draped in routine sterile fashion. Preoperative localize the appropriate level so after infiltration 10 cc lidocaine with epi a midline incision was made and Bovie light car was used to take down the subcutaneous tissues and subperiosteal dissections care lamina of L4 and L5  bilaterally. Interoperative X. identify the TPN pedicle of L4 and the L5 correction the L4 spinal and a complex complex was noted be markedly hypermobile so the facets were drilled down noted be markedly diastased. Pass process was removed central decompression was begun complete facetectomies were performed and the L4 and L5 nerve root were identified and skeletonized flush with the pedicles at both levels. The L4 nerve root on the left was markedly stenotic predominantly from the didn't spondylolisthesis but also a large osteophyte coming off the superior tickling facet at L5. Both he and dressed 6 of the the facet complex and interest of the L4 pedicle as needed B. drilled down to gain adequate decompression of the foramen L4. After adequate foraminotomies been performed the attention was taken to pedicle screw placement using a high-speed drill and fluoroscopy pilot holes were drilled cannulated with the awl probed A. Probe again and 6 5 x 40 screws inserted L4 and L5 bilaterally. All screws excellent purchase all pedicles were probed and using external interbody limb axis confirm no mediolateral breech. Then the disc space was coagulated and incised a size 11 distractor was inserted the patient's left side this had good apposition the endplates and filled be appropriate sizing for the graft material and has the hyperlordosis that she had I elected use the 11 mm caliber cages 10 mm wide 20 mm in length with a exaggerated lordosis the these grafts were packed disc spaces cleanout radically from the right using Epstein curettes suture rongeurs and a size 10 rotating cutter on the endplates were prepped and helddisc was removed the then the cage was inserted and expanded 4 turns which would take it to approximately 14 mm. Then the distractor was removed and fluoroscopy confirmed good position of the implant the then the  other side was prepared in a similar fashion local autograft was packed centrally and a cage was  inserted and also expanded a full 4 turns. The fluoroscopy confirmed adequate decompression expansion of the disc space was also reduced the deformity. Then AP fluoroscopy also confirmed good position of screws then aggressive decortication was care out laterally along the TPS and the remainder the local orders packed posterolaterally 40 mm rods in place and top tightness tightened down L5 the L4 screw was compressed against L5. Then 9 all the foraminal reinspected confirm no migration of graft material and confirmed Y. decompression Gelfoam was overlaid top of the dura cross-link was placed and the drain was placed the end of the wounds closed in layers with interrupted Vicryl the skin was closed with interrupted 30 vertical cracks and 3-0 Vicryl. At the end of the case all needle counts and that counts sponge counts were correct per the nurses.

## 2011-08-01 NOTE — Anesthesia Postprocedure Evaluation (Signed)
Anesthesia Post Note  Patient: Kaitlyn Diaz  Procedure(s) Performed: Procedure(s) (LRB): POSTERIOR LUMBAR FUSION 1 LEVEL (Bilateral)  Anesthesia type: general  Patient location: PACU  Post pain: Pain level controlled  Post assessment: Patient's Cardiovascular Status Stable  Last Vitals:  Filed Vitals:   08/01/11 1300  BP:   Pulse:   Temp: 36.5 C  Resp:     Post vital signs: Reviewed and stable  Level of consciousness: sedated  Complications: No apparent anesthesia complications

## 2011-08-01 NOTE — Transfer of Care (Signed)
Immediate Anesthesia Transfer of Care Note  Patient: Kaitlyn Diaz  Procedure(s) Performed: Procedure(s) (LRB): POSTERIOR LUMBAR FUSION 1 LEVEL (Bilateral)  Patient Location: PACU  Anesthesia Type: General  Level of Consciousness: patient cooperative and lethargic  Airway & Oxygen Therapy: Patient Spontanous Breathing and Patient connected to face mask oxygen  Post-op Assessment: Report given to PACU RN and Post -op Vital signs reviewed and stable  Post vital signs: Reviewed and stable  Complications: No apparent anesthesia complications

## 2011-08-01 NOTE — Anesthesia Procedure Notes (Signed)
Procedure Name: Intubation Date/Time: 08/01/2011 8:45 AM Performed by: Margaree Mackintosh Pre-anesthesia Checklist: Patient identified, Timeout performed, Emergency Drugs available, Suction available and Patient being monitored Patient Re-evaluated:Patient Re-evaluated prior to inductionOxygen Delivery Method: Circle system utilized Preoxygenation: Pre-oxygenation with 100% oxygen Intubation Type: IV induction Ventilation: Mask ventilation without difficulty and Oral airway inserted - appropriate to patient size Laryngoscope Size: Mac and 3 Grade View: Grade I Tube type: Oral Tube size: 7.0 mm Number of attempts: 1 Airway Equipment and Method: Stylet and LTA kit utilized Placement Confirmation: ETT inserted through vocal cords under direct vision,  positive ETCO2 and breath sounds checked- equal and bilateral Secured at: 20 cm Tube secured with: Tape Dental Injury: Teeth and Oropharynx as per pre-operative assessment

## 2011-08-01 NOTE — H&P (Signed)
Kaitlyn Diaz is an 46 y.o. female.   Chief Complaint: back and left leg pain HPI: A 46 year old female has had progressive worsening back and on her left leg pain is been refractory anti-inflammatories physical therapy epidural steroid injection facet blocks. 1 spondylolisthesis at L4-5 with diastased of the facets and fluid in the facet joints. A conservative treatment progression of clinical syndrome and imaging findings patient was recommended decompression stabilization procedure at L4-5 of the operation perioperative course and expectations of outcome and alternatives surgery the patient she understands and agrees to proceed forward.  Past Medical History  Diagnosis Date  . Complication of anesthesia     Difficult to put to sleep, Has run fever, "runs in famly"  . PONV (postoperative nausea and vomiting)   . Enlarged heart     hx of  . Pneumonia     hx of pneumonia x 2  . Anemia   . Urinary tract infection     and kidney infection Hx of  . GERD (gastroesophageal reflux disease)   . H/O hiatal hernia     hx of  . Headache     "due to Blood pressure"  . Neuromuscular disorder     "85 % nerve damage in left leg"  . Arthritis   . Anxiety     "panic attacks, claustophobic on xanax"  . Malignant hyperthermia   . Hypertension     Dr. Syble Creek, medical physician [phone]    Past Surgical History  Procedure Date  . Tonsillectomy     and adnoids  . Hand mass     1994, multiple masses removed - "all benign"  . Leg mass     left leg  surgically removed  . Breast surgery     left breast mass removed  . Tubal ligation   . Bladder suspension     No family history on file. Social History:  reports that she has been smoking Cigarettes.  She has a 34 pack-year smoking history. She does not have any smokeless tobacco history on file. She reports that she drinks alcohol. She reports that she does not use illicit drugs.  Allergies:  Allergies  Allergen Reactions  .  Amoxicillin Anaphylaxis, Itching and Swelling  . Codeine Other (See Comments)    nausea  . Oxycontin Other (See Comments)    hallucinations    Medications Prior to Admission  Medication Sig Dispense Refill  . albuterol (PROVENTIL HFA;VENTOLIN HFA) 108 (90 BASE) MCG/ACT inhaler Inhale 1 puff into the lungs 3 (three) times daily as needed. For shortness of breath      . ALPRAZolam (XANAX) 1 MG tablet Take 1 mg by mouth 3 (three) times daily.      Marland Kitchen amLODipine (NORVASC) 10 MG tablet Take 10 mg by mouth daily.        . carvedilol (COREG) 12.5 MG tablet Take 12.5 mg by mouth 2 (two) times daily with a meal.        . ferrous sulfate 325 (65 FE) MG tablet Take 325 mg by mouth 2 (two) times daily.       . Fluticasone-Salmeterol (ADVAIR) 250-50 MCG/DOSE AEPB Inhale 1 puff into the lungs at bedtime.       . gabapentin (NEURONTIN) 300 MG capsule Take 600 mg by mouth at bedtime.      . hydrochlorothiazide (MICROZIDE) 12.5 MG capsule Take 12.5 mg by mouth daily as needed. For swelling in hands/legs      . HYDROcodone-acetaminophen (NORCO) 5-325 MG  per tablet Take 2 tablets by mouth every 4 (four) hours as needed. For pain      . omeprazole (PRILOSEC) 40 MG capsule Take 40 mg by mouth daily.          No results found for this or any previous visit (from the past 48 hour(s)). No results found.  Review of Systems  Constitutional: Negative.   HENT: Negative.   Eyes: Negative.   Respiratory: Positive for shortness of breath.   Cardiovascular: Negative.   Gastrointestinal: Negative.   Genitourinary: Negative.   Musculoskeletal: Positive for myalgias, back pain and joint pain.  Skin: Negative.   Neurological: Positive for sensory change.    Blood pressure 128/89, pulse 73, temperature 98.1 F (36.7 C), temperature source Oral, resp. rate 18, last menstrual period 07/29/2011, SpO2 98.00%. Physical Exam  Constitutional: She is oriented to person, place, and time. She appears well-developed and  well-nourished.  HENT:  Head: Normocephalic.  Eyes: Pupils are equal, round, and reactive to light.  Neck: Neck supple.  GI: Soft.  Neurological: She is alert and oriented to person, place, and time. She has normal strength. GCS eye subscore is 4. GCS verbal subscore is 5. GCS motor subscore is 6.  Reflex Scores:      Patellar reflexes are 0 on the right side and 0 on the left side.      Achilles reflexes are 0 on the right side and 0 on the left side.       Right lower extremity is 5 out of 5 in her iliopsoas quads hamstrings gastrocs anterior tibialis and EHL foot and her left lower extremity she has quadriceps hamstrings gastrocs however she is weak in her EHL at 4 and 5 slightly weak on dorsiflexion 4+ out of 5     Assessment/Plan Present for an L4-5 posterior lumbar in by fusion risks and benefits of explained the patient she understood and agreed to proceed forward.  Kaitlyn Diaz P 08/01/2011, 8:16 AM

## 2011-08-02 ENCOUNTER — Inpatient Hospital Stay (HOSPITAL_COMMUNITY): Payer: BC Managed Care – PPO

## 2011-08-02 ENCOUNTER — Inpatient Hospital Stay (HOSPITAL_COMMUNITY): Admission: RE | Admit: 2011-08-02 | Payer: BC Managed Care – PPO | Source: Ambulatory Visit

## 2011-08-02 MED ORDER — SODIUM CHLORIDE 0.9 % IV BOLUS (SEPSIS)
500.0000 mL | Freq: Once | INTRAVENOUS | Status: AC
  Administered 2011-08-02: 500 mL via INTRAVENOUS

## 2011-08-02 NOTE — Evaluation (Signed)
Physical Therapy Evaluation Patient Details Name: Kaitlyn Diaz MRN: [id number] DOB: Apr 12, 1965 Today's Date: 08/02/2011 Time: 5621-3086 PT Time Calculation (min): 22 min  PT Assessment / Plan / Recommendation Clinical Impression  Pt presents with a medical diagnosis of PLIF along with the following impairments/deficits and therapy diagnosis listed below. Pt will benefit from skilled PT in the acute care setting in order to maximize functional mobility for a safe d/c home    PT Assessment  Patient needs continued PT services    Follow Up Recommendations  No PT follow up;Supervision - Intermittent    Equipment Recommendations  None recommended by PT    Frequency Min 5X/week    Precautions / Restrictions Precautions Precautions: Back Precaution Booklet Issued: Yes (comment) Precaution Comments: pt educated on 3/3 back precautions Required Braces or Orthoses: Spinal Brace Spinal Brace: Lumbar corset;Applied in sitting position Restrictions Weight Bearing Restrictions: No   Pertinent Vitals/Pain BP monitored throughout session. Pt with no signs/symptoms. RN aware of BP throughout treatment.      Mobility  Bed Mobility Bed Mobility: Rolling Left;Left Sidelying to Sit;Sitting - Scoot to Edge of Bed Rolling Left: 4: Min assist Left Sidelying to Sit: 4: Min assist Sitting - Scoot to Edge of Bed: 6: Modified independent (Device/Increase time) Details for Bed Mobility Assistance: VC for sequencing. Min assist through trunk. Cueing for sequencing to maintain back precautions Transfers Transfers: Sit to Stand;Stand to Sit;Stand Pivot Transfers Sit to Stand: 4: Min guard Stand to Sit: 4: Min guard Stand Pivot Transfers: 4: Min assist Details for Transfer Assistance: VC for hand placement and sequencing. Assist for stability during transfer    Exercises     PT Goals Acute Rehab PT Goals PT Goal Formulation: With patient Time For Goal Achievement: 08/09/11 Potential to Achieve  Goals: Good Pt will Roll Supine to Right Side: with modified independence PT Goal: Rolling Supine to Right Side - Progress: Goal set today Pt will Roll Supine to Left Side: with modified independence PT Goal: Rolling Supine to Left Side - Progress: Goal set today Pt will go Supine/Side to Sit: with modified independence PT Goal: Supine/Side to Sit - Progress: Goal set today Pt will go Sit to Supine/Side: with modified independence PT Goal: Sit to Supine/Side - Progress: Goal set today Pt will go Sit to Stand: with modified independence PT Goal: Sit to Stand - Progress: Goal set today Pt will go Stand to Sit: with modified independence PT Goal: Stand to Sit - Progress: Goal set today Pt will Transfer Bed to Chair/Chair to Bed: with modified independence PT Transfer Goal: Bed to Chair/Chair to Bed - Progress: Goal set today Pt will Ambulate: >150 feet;with modified independence;with least restrictive assistive device PT Goal: Ambulate - Progress: Goal set today Pt will Go Up / Down Stairs: 1-2 stairs;with min assist PT Goal: Up/Down Stairs - Progress: Goal set today  Visit Information  Assistance Needed: +1    Subjective Data      Prior Functioning  Home Living Lives With: Spouse;Daughter (19yo, 23 yo) Available Help at Discharge: Available 24 hours/day;Family;Friend(s) Type of Home: House Home Access: Stairs to enter [employer] of Steps: 2 Entrance Stairs-Rails: None Home Layout: One level Bathroom Shower/Tub: Engineer, manufacturing systems: Standard Bathroom Accessibility: Yes How Accessible: Accessible via walker Home Adaptive Equipment: Bedside commode/3-in-1;Shower chair with back;Walker - rolling Prior Function Level of Independence: Independent Able to Take Stairs?: Yes Driving: Yes Communication Communication: No difficulties    Cognition  Overall Cognitive Status: Appears within functional  limits for tasks assessed/performed Arousal/Alertness:  Awake/alert Orientation Level: Oriented X4 / Intact Behavior During Session: [facility] for tasks performed    Extremity/Trunk Assessment Right Lower Extremity Assessment RLE ROM/Strength/Tone: Within functional levels RLE Sensation: WFL - Light Touch Left Lower Extremity Assessment LLE ROM/Strength/Tone: Within functional levels LLE Sensation: WFL - Light Touch   Balance    End of Session PT - End of Session Equipment Utilized During Treatment: Gait belt;Back brace Activity Tolerance: Patient tolerated treatment well Patient left: in chair;with call bell/phone within reach Nurse Communication: Mobility status;Precautions   Milana Kidney 08/02/2011, 1:23 PM  08/02/2011 Milana Kidney DPT PAGER: [phone] OFFICE: [phone]

## 2011-08-02 NOTE — Plan of Care (Signed)
Problem: Consults Goal: Diagnosis - Spinal Surgery Outcome: Progressing Microdiscectomy     

## 2011-08-02 NOTE — Progress Notes (Signed)
Utilization review completed. Xitlally Mooneyham, RN, BSN.  08/02/11  

## 2011-08-02 NOTE — Progress Notes (Signed)
Occupational Therapy Evaluation Patient Details Name: Kaitlyn Diaz MRN: [id number] DOB: 1965-08-31 Today's Date: 08/02/2011 Time: 7829-5621 OT Time Calculation (min): 22 min  OT Assessment / Plan / Recommendation Clinical Impression  Pt s/p PLIF thus affecting PLOF. Will benefit from acute OT to address below problem list in prep for d/c home with family.    OT Assessment  Patient needs continued OT Services    Follow Up Recommendations  Supervision/Assistance - 24 hour    Equipment Recommendations  None recommended by OT    Frequency Min 2X/week    Precautions / Restrictions Precautions Precautions: Back Precaution Booklet Issued: Yes (comment) Precaution Comments: pt educated on 3/3 back precautions Required Braces or Orthoses: Spinal Brace Spinal Brace: Lumbar corset;Applied in sitting position Restrictions Weight Bearing Restrictions: No   Pertinent Vitals/Pain NA    ADL  Lower Body Bathing: Simulated;Minimal assistance Where Assessed - Lower Body Bathing: Sit to stand from bed Lower Body Dressing: Simulated;Minimal assistance Where Assessed - Lower Body Dressing: Sit to stand from bed Toilet Transfer: Simulated;Minimal assistance Toilet Transfer Method: Stand pivot Toilet Transfer Equipment: Other (comment) (chair) Equipment Used: Back brace;Gait belt Ambulation Related to ADLs: Pt declined ambulation due to breakfast arriving and wishing to sit in chair for to eat.    OT Goals Acute Rehab OT Goals OT Goal Formulation: With patient Time For Goal Achievement: 08/09/11 Potential to Achieve Goals: Good ADL Goals Pt Will Perform Grooming: with set-up;Standing at sink ADL Goal: Grooming - Progress: Goal set today Pt Will Perform Lower Body Bathing: with set-up;Sit to stand from chair;Sit to stand from bed;with adaptive equipment ADL Goal: Lower Body Bathing - Progress: Goal set today Pt Will Perform Lower Body Dressing: with set-up;Sit to stand from chair;Sit to  stand from bed;with adaptive equipment ADL Goal: Lower Body Dressing - Progress: Goal set today Pt Will Transfer to Toilet: Ambulation;with supervision;with DME;3-in-1;Maintaining back safety precautions ADL Goal: Toilet Transfer - Progress: Goal set today Pt Will Perform Toileting - Clothing Manipulation: with modified independence;Sitting on 3-in-1 or toilet;Standing ADL Goal: Toileting - Clothing Manipulation - Progress: Goal set today Pt Will Perform Toileting - Hygiene: with modified independence;Standing at 3-in-1/toilet;Sit to stand from 3-in-1/toilet ADL Goal: Toileting - Hygiene - Progress: Goal set today Pt Will Perform Tub/Shower Transfer: Tub transfer;with supervision;Ambulation;Shower seat with back;Maintaining back safety precautions ADL Goal: Web designer - Progress: Goal set today Miscellaneous OT Goals Miscellaneous OT Goal #1: Pt will perform bed mobility with mod I in prep for EOB ADLs OT Goal: Miscellaneous Goal #1 - Progress: Goal set today  Visit Information  Assistance Needed: +1 PT/OT Co-Evaluation/Treatment: Yes    Subjective Data      Prior Functioning  Home Living Lives With: Spouse;Daughter (19yo, 65 yo) Available Help at Discharge: Available 24 hours/day;Family;Friend(s) Type of Home: House Home Access: Stairs to enter [employer] of Steps: 2 Entrance Stairs-Rails: None Home Layout: One level Bathroom Shower/Tub: Engineer, manufacturing systems: Standard Bathroom Accessibility: Yes How Accessible: Accessible via walker Home Adaptive Equipment: Bedside commode/3-in-1;Shower chair with back;Walker - rolling Prior Function Level of Independence: Independent Able to Take Stairs?: Yes Driving: Yes Communication Communication: No difficulties    Cognition  Overall Cognitive Status: Appears within functional limits for tasks assessed/performed Arousal/Alertness: Awake/alert Orientation Level: Oriented X4 / Intact Behavior During  Session: [facility] for tasks performed    Extremity/Trunk Assessment Right Upper Extremity Assessment RUE ROM/Strength/Tone: Within functional levels RUE Sensation: WFL - Light Touch;WFL - Proprioception RUE Coordination: WFL - gross/fine motor Left Upper  Extremity Assessment LUE ROM/Strength/Tone: Within functional levels LUE Sensation: WFL - Light Touch;WFL - Proprioception LUE Coordination: WFL - gross/fine motor Right Lower Extremity Assessment RLE ROM/Strength/Tone: Within functional levels RLE Sensation: WFL - Light Touch Left Lower Extremity Assessment LLE ROM/Strength/Tone: Within functional levels LLE Sensation: WFL - Light Touch   Mobility Bed Mobility Bed Mobility: Rolling Left;Left Sidelying to Sit;Sitting - Scoot to Edge of Bed Rolling Left: 4: Min assist Left Sidelying to Sit: 4: Min assist Sitting - Scoot to Edge of Bed: 6: Modified independent (Device/Increase time) Details for Bed Mobility Assistance: VC for sequencing. Min assist through trunk. Cueing for sequencing to maintain back precautions Transfers Sit to Stand: 4: Min guard Stand to Sit: 4: Min guard Details for Transfer Assistance: VC for hand placement and sequencing. Assist for stability during transfer   Exercise    Balance    End of Session OT - End of Session Equipment Utilized During Treatment: Gait belt;Back brace Activity Tolerance: Patient tolerated treatment well Patient left: in chair;with call bell/phone within reach Nurse Communication: Mobility status  08/02/2011 Cipriano Mile OTR/L Pager [phone] Office [phone]  Cipriano Mile 08/02/2011, 3:43 PM

## 2011-08-02 NOTE — Progress Notes (Signed)
Subjective:  Patient reports That she's feeling okay this morning no leg pain pain is all localized to her incision around her low back had some difficulty getting her pain medication because her blood pressure is been low or no new numbness or tingling. He is not passing gas yet.  Objective: Vital signs in last 24 hours: Temp:  [97 F (36.1 C)-99.3 F (37.4 C)] 98 F (36.7 C) (04/25 0700) Pulse Rate:  [67-91] 80  (04/25 0600) Resp:  [10-25] 13  (04/25 0600) BP: (97-132)/(54-118) 104/61 mmHg (04/25 0600) SpO2:  [89 %-99 %] 99 % (04/25 0600) Weight:  [81 kg (178 lb 9.2 oz)] 81 kg (178 lb 9.2 oz) (04/24 1500)  Intake/Output from previous day: 04/24 0701 - 04/25 0700 In: 4958.8 [P.O.:1680; I.V.:3078.8; IV Piggyback:200] Out: 5160 [Urine:4750; Drains:210; Blood:200] Intake/Output this shift:    Strength is 5 out of right looks very left lower extremity has weakness in dorsiflexion of the left foot at 4/5 that is her baseline wound is clean and dry  Lab Results: No results found for this basename: WBC:2,HGB:2,HCT:2,PLT:2 in the last 72 hours BMET No results found for this basename: NA:2,K:2,CL:2,CO2:2,GLUCOSE:2,BUN:2,CREATININE:2,CALCIUM:2 in the last 72 hours  Studies/Results: Dg Lumbar Spine 2-3 Views  08/01/2011  *RADIOLOGY REPORT*  Clinical Data: L4-5 fusion  LUMBAR SPINE - 2-3 VIEW,C-ARM 61-120 MIN  Comparison: None.  Findings: AP and lateral intraoperative fluoroscopic spot images are submitted.  The patient is status post L4-5 PLIF.  Metallic spacers and bone graft are in place following discectomy. Bilateral pedicle screws are in place with rod fixation.  A cross bar is evident.  IMPRESSION: Status post L4-5 PLIF without radiographic evidence for complication.  Original Report Authenticated By: Jamesetta Orleans. MATTERN, M.D.   Dg C-arm 61-120 Min  08/01/2011  *RADIOLOGY REPORT*  Clinical Data: L4-5 fusion  LUMBAR SPINE - 2-3 VIEW,C-ARM 61-120 MIN  Comparison: None.  Findings: AP  and lateral intraoperative fluoroscopic spot images are submitted.  The patient is status post L4-5 PLIF.  Metallic spacers and bone graft are in place following discectomy. Bilateral pedicle screws are in place with rod fixation.  A cross bar is evident.  IMPRESSION: Status post L4-5 PLIF without radiographic evidence for complication.  Original Report Authenticated By: Jamesetta Orleans. MATTERN, M.D.    Assessment/Plan: Postop day 1 from a pleasant overall doing very well however mildly hypotensive with systolic pressures in the high 90s low 100s will give her a fluid bolus and watch her here in the ICU until her blood pressure stabilizes we'll progressively mobilize with physical therapy.  LOS: 1 day     Marillyn Goren P 08/02/2011, 8:02 AM

## 2011-08-03 LAB — BASIC METABOLIC PANEL
BUN: <3 mg/dL — ABNORMAL LOW (ref 6–23)
CO2: 25 mEq/L (ref 19–32)
Calcium: 8.7 mg/dL (ref 8.4–10.5)
Chloride: 99 mEq/L (ref 96–112)
Creatinine, Ser: 0.4 mg/dL — ABNORMAL LOW (ref 0.50–1.10)
GFR calc Af Amer: >90 mL/min (ref >90)
GFR calc non Af Amer: >90 mL/min (ref >90)
Glucose, Bld: 149 mg/dL — ABNORMAL HIGH (ref 70–99)
Potassium: 2.9 mEq/L — ABNORMAL LOW (ref 3.5–5.1)
Sodium: 136 mEq/L (ref 135–145)

## 2011-08-03 MED ORDER — POLYETHYLENE GLYCOL 3350 17 G PO PACK
17.0000 g | PACK | Freq: Every day | ORAL | Status: DC
  Administered 2011-08-03 – 2011-08-05 (×3): 17 g via ORAL
  Filled 2011-08-03 (×3): qty 1

## 2011-08-03 MED FILL — Heparin Sodium (Porcine) Inj 1000 Unit/ML: INTRAMUSCULAR | Qty: 30 | Status: AC

## 2011-08-03 MED FILL — Sodium Chloride IV Soln 0.9%: INTRAVENOUS | Qty: 1000 | Status: AC

## 2011-08-03 NOTE — Progress Notes (Signed)
Physical Therapy Treatment Patient Details Name: Kaitlyn Diaz MRN: [id number] DOB: 06/24/1965 Today's Date: 08/03/2011 Time: 4098-1191; 4782-9562 PT Time Calculation (min): 13 min  PT Assessment / Plan / Recommendation Comments on Treatment Session  Assisted pt on and off toilet with minguard assist only. Following toileting, pt had visitors arrive and requested to come back to complete ambulation. On 2nd attempt, pts family was in the room and pt did not want to ambulate as she stated that she just completed a long ambulation with the nurse. Educated pt and family on safety at home and ways to maintain back precautions when d/c. Plan to attempt stairs next treatment    Follow Up Recommendations  No PT follow up;Supervision - Intermittent    Equipment Recommendations  None recommended by PT;None recommended by OT    Frequency Min 5X/week   Plan Discharge plan remains appropriate;Frequency remains appropriate    Precautions / Restrictions Precautions Precautions: Back Precaution Booklet Issued: Yes (comment) Precaution Comments: pt educated on 3/3 back precautions Required Braces or Orthoses: Spinal Brace Spinal Brace: Lumbar corset;Applied in sitting position Restrictions Weight Bearing Restrictions: No       Mobility  Transfers Transfers: Sit to Stand;Stand to Sit;Stand Pivot Transfers Sit to Stand: 4: Min guard;With upper extremity assist;From toilet Stand to Sit: 4: Min guard;With upper extremity assist;To toilet Details for Transfer Assistance: VC for proper hand placement and anterior translation to maintain back precautions upon standing Ambulation/Gait Ambulation/Gait Assistance: 4: Min guard Ambulation Distance (Feet): 20 Feet Assistive device: None Ambulation/Gait Assistance Details: VC for proper sequencing and posture throughout treatment. Pt declined longer ambulation after using the bathroom.  Gait Pattern: Step-to pattern;Decreased hip/knee flexion -  left;Decreased stride length;Decreased hip/knee flexion - right;Trunk flexed Gait velocity: decreased gait speed        PT Goals Acute Rehab PT Goals PT Goal Formulation: With patient PT Goal: Sit to Stand - Progress: Progressing toward goal PT Goal: Stand to Sit - Progress: Progressing toward goal PT Transfer Goal: Bed to Chair/Chair to Bed - Progress: Progressing toward goal PT Goal: Ambulate - Progress: Progressing toward goal  Visit Information  Last PT Received On: 08/03/11 Assistance Needed: +1    Subjective Data      Cognition  Overall Cognitive Status: Appears within functional limits for tasks assessed/performed Arousal/Alertness: Awake/alert Orientation Level: Oriented X4 / Intact Behavior During Session: [facility] for tasks performed       End of Session PT - End of Session Equipment Utilized During Treatment: Gait belt;Back brace Activity Tolerance: Patient tolerated treatment well Patient left: in chair;with call bell/phone within reach;with family/visitor present Nurse Communication: Mobility status;Precautions    Milana Kidney 08/03/2011, 5:28 PM  08/03/2011 Milana Kidney DPT PAGER: [phone] OFFICE: [phone]

## 2011-08-03 NOTE — Progress Notes (Signed)
OT Cancellation Note  Treatment cancelled today due to pt's refusal to participate.  Pt declining OT at this time as she had just returned to bed and c/o stomach discomfort.  Will re-attempt as time allows.  08/03/2011 Cipriano Mile OTR/L Pager [phone] Office [phone]

## 2011-08-03 NOTE — Progress Notes (Signed)
CSW received consult for SNF. PT is not recommending follow up. RNCM is aware. CSW is signing off as no further discharge social work needs identified. Please reconsult if a need arises prior to discharge.   Dede Query, MSW, Theresia Majors [phone]

## 2011-08-03 NOTE — Progress Notes (Signed)
Chart reviewed and noted that HHPT and HHOT not recommended by PT/OT evals.  No DME recommended.  Johny Shock RN MPH

## 2011-08-03 NOTE — Progress Notes (Signed)
Subjective: Patient reports Still better she started past we'll gas she's having no leg pain the movement strength in her left foot is improved  Objective: Vital signs in last 24 hours: Temp:  [97.6 F (36.4 C)-98.7 F (37.1 C)] 98.7 F (37.1 C) (04/26 0700) Pulse Rate:  [78-94] 83  (04/26 1000) Resp:  [15-24] 19  (04/26 0900) BP: (101-139)/(57-106) 109/79 mmHg (04/26 1000) SpO2:  [93 %-98 %] 96 % (04/26 1000)  Intake/Output from previous day: 04/25 0701 - 04/26 0700 In: 3755 [P.O.:2280; I.V.:975; IV Piggyback:500] Out: 5745 [Urine:5550; Drains:195] Intake/Output this shift: Total I/O In: 363 [P.O.:360; I.V.:3] Out: 500 [Urine:500]  Strength is now 4+ out of 5 in his gait EHL and dorsiflexion the left foot otherwise out of 5 the wound is clean and dry  Lab Results: No results found for this basename: WBC:2,HGB:2,HCT:2,PLT:2 in the last 72 hours BMET No results found for this basename: NA:2,K:2,CL:2,CO2:2,GLUCOSE:2,BUN:2,CREATININE:2,CALCIUM:2 in the last 72 hours  Studies/Results: Dg Lumbar Spine 2-3 Views  08/01/2011  *RADIOLOGY REPORT*  Clinical Data: L4-5 fusion  LUMBAR SPINE - 2-3 VIEW,C-ARM 61-120 MIN  Comparison: None.  Findings: AP and lateral intraoperative fluoroscopic spot images are submitted.  The patient is status post L4-5 PLIF.  Metallic spacers and bone graft are in place following discectomy. Bilateral pedicle screws are in place with rod fixation.  A cross bar is evident.  IMPRESSION: Status post L4-5 PLIF without radiographic evidence for complication.  Original Report Authenticated By: Jamesetta Orleans. MATTERN, M.D.   Dg Abd Portable 1v  08/02/2011  *RADIOLOGY REPORT*  Clinical Data: Abdominal pain and swelling  PORTABLE ABDOMEN - 1 VIEW  Comparison: 08/01/2011 intraoperative imaging  Findings: Lumbar fusion hardware noted.  No significant change allowing for technique.  Catheter tubing overlies the left mid abdomen.  No dilated gas filled loop of bowel.  Mild  gaseous prominence of small and large bowel and stomach. Presence or absence of air fluid levels or free air cannot be assessed on this single supine view.  IMPRESSION: Mild gaseous prominence of the stomach, colon, and small bowel, which may suggest mild / early ileus but is nonspecific.  Original Report Authenticated By: Harrel Lemon, M.D.   Dg C-arm 61-120 Min  08/01/2011  *RADIOLOGY REPORT*  Clinical Data: L4-5 fusion  LUMBAR SPINE - 2-3 VIEW,C-ARM 61-120 MIN  Comparison: None.  Findings: AP and lateral intraoperative fluoroscopic spot images are submitted.  The patient is status post L4-5 PLIF.  Metallic spacers and bone graft are in place following discectomy. Bilateral pedicle screws are in place with rod fixation.  A cross bar is evident.  IMPRESSION: Status post L4-5 PLIF without radiographic evidence for complication.  Original Report Authenticated By: Jamesetta Orleans. MATTERN, M.D.    Assessment/Plan: Aggressive mobilization physical therapy will at her transfer into the floor will start her about her to now she is passing gas  LOS: 2 days     Cha Gomillion P 08/03/2011, 10:36 AM

## 2011-08-04 MED ORDER — NYSTATIN 100000 UNIT/ML MT SUSP
5.0000 mL | Freq: Four times a day (QID) | OROMUCOSAL | Status: DC
  Administered 2011-08-05 (×2): 500000 [IU] via ORAL
  Filled 2011-08-04 (×5): qty 5

## 2011-08-04 MED ORDER — HYDROMORPHONE HCL 2 MG PO TABS
2.0000 mg | ORAL_TABLET | ORAL | Status: DC | PRN
  Administered 2011-08-04 – 2011-08-05 (×4): 2 mg via ORAL
  Filled 2011-08-04 (×4): qty 1

## 2011-08-04 MED ORDER — HYDROCODONE-ACETAMINOPHEN 10-325 MG PO TABS
1.0000 | ORAL_TABLET | ORAL | Status: DC | PRN
  Administered 2011-08-04: 2 via ORAL
  Administered 2011-08-05: 1 via ORAL
  Administered 2011-08-05 (×2): 2 via ORAL
  Filled 2011-08-04 (×4): qty 2

## 2011-08-04 NOTE — Progress Notes (Signed)
Subjective: Patient reports "I lay in bed and peed on myself.  Nobody came when I called. I've been in terrible pain."  Objective: Vital signs in last 24 hours: Temp:  [98 F (36.7 C)-99.1 F (37.3 C)] 98.5 F (36.9 C) (04/27 1020) Pulse Rate:  [85-98] 97  (04/27 1020) Resp:  [18-20] 18  (04/27 1020) BP: (114-139)/(75-89) 130/89 mmHg (04/27 1020) SpO2:  [92 %-100 %] 94 % (04/27 1020)  Intake/Output from previous day: 04/26 0701 - 04/27 0700 In: 1203 [P.O.:1200; I.V.:3] Out: 540 [Urine:500; Drains:40] Intake/Output this shift:    Physical Exam: Up in chair in brace.  No leg pain. Hemovac still in place.  Lab Results: No results found for this basename: WBC:2,HGB:2,HCT:2,PLT:2 in the last 72 hours BMET  [facility] 08/03/11 1721  NA 136  K 2.9*  CL 99  CO2 25  GLUCOSE 149*  BUN <3*  CREATININE 0.40*  CALCIUM 8.7    Studies/Results: Dg Abd Portable 1v  08/02/2011  *RADIOLOGY REPORT*  Clinical Data: Abdominal pain and swelling  PORTABLE ABDOMEN - 1 VIEW  Comparison: 08/01/2011 intraoperative imaging  Findings: Lumbar fusion hardware noted.  No significant change allowing for technique.  Catheter tubing overlies the left mid abdomen.  No dilated gas filled loop of bowel.  Mild gaseous prominence of small and large bowel and stomach. Presence or absence of air fluid levels or free air cannot be assessed on this single supine view.  IMPRESSION: Mild gaseous prominence of the stomach, colon, and small bowel, which may suggest mild / early ileus but is nonspecific.  Original Report Authenticated By: Harrel Lemon, M.D.    Assessment/Plan: D/C Hemovac.  Will help pt move bowels. Will determine whether dilaudid or increased strength hydrocodone will work better for pain control at home.    LOS: 3 days    Dorian Heckle, MD 08/04/2011, 12:44 PM

## 2011-08-04 NOTE — Progress Notes (Signed)
Physical Therapy Treatment Patient Details Name: Kaitlyn Diaz MRN: [id number] DOB: 10-Apr-1965 Today's Date: 08/04/2011 Time: 1324-4010 PT Time Calculation (min): 14 min  PT Assessment / Plan / Recommendation Comments on Treatment Session  Pt still limited by pain although progressing well functionally. Pt is near baseline. Supervision still required for safety due to antalgic gait. Recommended RW at home when pt feels necessary, pt is safe without AD.    Follow Up Recommendations  No PT follow up;Supervision - Intermittent    Equipment Recommendations  None recommended by PT;None recommended by OT    Frequency Min 5X/week   Plan Discharge plan remains appropriate;Frequency remains appropriate    Precautions / Restrictions Precautions Precautions: Back Precaution Booklet Issued: Yes (comment) Precaution Comments: pt able to verbalize 2/3 back precautions. Reeducated on 3 with cueing throughout treatment Required Braces or Orthoses: Spinal Brace Spinal Brace: Lumbar corset;Applied in sitting position Restrictions Weight Bearing Restrictions: No       Mobility  Transfers Transfers: Sit to Stand;Stand to Sit;Stand Pivot Transfers Sit to Stand: With upper extremity assist;5: Supervision;From chair/3-in-1 Stand to Sit: With upper extremity assist;5: Supervision;To chair/3-in-1 Details for Transfer Assistance: VC for hand placement for a safe stand Ambulation/Gait Ambulation/Gait Assistance: 5: Supervision Ambulation Distance (Feet): 200 Feet Assistive device: None Ambulation/Gait Assistance Details: Supervision for safety throughout ambulation. Pt with antalgic gait although required no AD or assistance. Gait Pattern: Step-to pattern;Decreased hip/knee flexion - left;Decreased stride length;Decreased hip/knee flexion - right;Trunk flexed Gait velocity: decreased gait speed Stairs: Yes Stairs Assistance: 5: Supervision Stairs Assistance Details (indicate cue type and reason): Pt  performed 2 stairs with and without railings. No difficulty. Stair Management Technique: No rails;One rail Right;Forwards;Step to pattern Number of Stairs: 2     Exercises     PT Goals Acute Rehab PT Goals PT Goal Formulation: With patient PT Goal: Sit to Stand - Progress: Progressing toward goal PT Goal: Stand to Sit - Progress: Progressing toward goal PT Transfer Goal: Bed to Chair/Chair to Bed - Progress: Progressing toward goal PT Goal: Ambulate - Progress: Progressing toward goal PT Goal: Up/Down Stairs - Progress: Met  Visit Information  Last PT Received On: 08/04/11    Subjective Data  Subjective: "my hip hurts more than the back"   Cognition  Overall Cognitive Status: Appears within functional limits for tasks assessed/performed Arousal/Alertness: Awake/alert Orientation Level: Oriented X4 / Intact Behavior During Session: [facility] for tasks performed    Balance     End of Session PT - End of Session Equipment Utilized During Treatment: Gait belt;Back brace Activity Tolerance: Patient tolerated treatment well Patient left: in chair;with call bell/phone within reach;with family/visitor present Nurse Communication: Mobility status;Precautions    Milana Kidney 08/04/2011, 4:17 PM  08/04/2011 Milana Kidney DPT PAGER: [phone] OFFICE: [phone]

## 2011-08-04 NOTE — Progress Notes (Signed)
OT Cancellation Note  Treatment cancelled today due to pt. just getting visitors will Re-attempt as time allows.Marland Kitchen  Tudor Chandley, OTR/L Pager [phone] 08/04/2011, 1:00 PM

## 2011-08-05 MED ORDER — NYSTATIN 100000 UNIT/ML MT SUSP: 5.0000 mL | Freq: Four times a day (QID) | OROMUCOSAL | Status: AC

## 2011-08-05 MED ORDER — CYCLOBENZAPRINE HCL 10 MG PO TABS: 10.0000 mg | ORAL_TABLET | Freq: Three times a day (TID) | ORAL | Status: AC | PRN

## 2011-08-05 MED ORDER — HYDROMORPHONE HCL 2 MG PO TABS: 2.0000 mg | ORAL_TABLET | ORAL | Status: AC | PRN

## 2011-08-05 MED ORDER — POTASSIUM CHLORIDE ER 10 MEQ PO TBCR: 20.0000 meq | EXTENDED_RELEASE_TABLET | Freq: Two times a day (BID) | ORAL | Status: DC

## 2011-08-05 NOTE — Discharge Summary (Addendum)
Physician Discharge Summary  Patient ID: Kaitlyn Diaz MRN: [id number] DOB/AGE: 1965-09-08 46 y.o.  Admit date: 08/01/2011 Discharge date: 08/05/2011  Admission Diagnoses: Spondylolisthesis L4-L5 with stenosis and radiculopathy  Discharge Diagnoses: Spondylolisthesis L4-L5 with stenosis and radiculopathy hypokalemia Active Problems:  * No active hospital problems. *    Discharged Condition: good  Hospital Course: Patient was admitted to undergo surgical decompression of a spondylolisthesis with severe stenosis at L4-L5. She tolerated the procedure well. She is very slow moving and first complaining of significant back pain. Gradually this improved. She is discharged home. At time of discharge it was noted that her potassium had gone down to 2.9. She is given a prescription for replacement.  Consults: None  Significant Diagnostic Studies: None  Treatments: surgery: Bilateral decompression L4-L5 with posterior lumbar interbody arthrodesis nonsegmental fixation L4-L5 with posterior lateral arthrodesis L4-L5  Discharge Exam: Blood pressure 112/77, pulse 91, temperature 98.3 F (36.8 C), temperature source Oral, resp. rate 18, height 5\' 2"  (1.575 m), weight 81 kg (178 lb 9.2 oz), last menstrual period 07/29/2011, SpO2 96.00%. Incision is clean and dry motor function in lower extremities is intact  Disposition: Discharge home  Discharge Orders    Future Orders Please Complete By Expires   Diet - low sodium heart healthy      Increase activity slowly      Discharge instructions      Comments:   Sit straight walk straight stand straight mind your posture. Okay to shower. Walk as much as tolerated.   Call MD for:  redness, tenderness, or signs of infection (pain, swelling, redness, odor or green/yellow discharge around incision site)      Call MD for:  severe uncontrolled pain      Call MD for:  temperature >100.4        Medication List  As of 08/05/2011 12:25 PM   TAKE these  medications         albuterol 108 (90 BASE) MCG/ACT inhaler   Commonly known as: PROVENTIL HFA;VENTOLIN HFA   Inhale 1 puff into the lungs 3 (three) times daily as needed. For shortness of breath      ALPRAZolam 1 MG tablet   Commonly known as: XANAX   Take 1 mg by mouth 3 (three) times daily.      amLODipine 10 MG tablet   Commonly known as: NORVASC   Take 10 mg by mouth daily.      carvedilol 12.5 MG tablet   Commonly known as: COREG   Take 12.5 mg by mouth 2 (two) times daily with a meal.      ferrous sulfate 325 (65 FE) MG tablet   Take 325 mg by mouth 2 (two) times daily.      Fluticasone-Salmeterol 250-50 MCG/DOSE Aepb   Commonly known as: ADVAIR   Inhale 1 puff into the lungs at bedtime.      gabapentin 300 MG capsule   Commonly known as: NEURONTIN   Take 600 mg by mouth at bedtime.      hydrochlorothiazide 12.5 MG capsule   Commonly known as: MICROZIDE   Take 12.5 mg by mouth daily as needed. For swelling in hands/legs      HYDROcodone-acetaminophen 5-325 MG per tablet   Commonly known as: NORCO   Take 2 tablets by mouth every 4 (four) hours as needed. For pain      HYDROmorphone 2 MG tablet   Commonly known as: DILAUDID   Take 1 tablet (2 mg total) by mouth  every 3 (three) hours as needed for pain.      omeprazole 40 MG capsule   Commonly known as: PRILOSEC   Take 40 mg by mouth daily.             SignedStefani Dama 08/05/2011, 12:25 PM

## 2011-08-05 NOTE — Plan of Care (Signed)
Problem: Consults Goal: Diagnosis - Spinal Surgery Outcome: Completed/Met Date Met:  08/05/11 PLIF L3-L4

## 2011-08-05 NOTE — Progress Notes (Signed)
Patient appears to have "thrush" in her mouth.  Called Dr. Venetia Maxon who ordered course of nystatin.

## 2011-08-30 ENCOUNTER — Ambulatory Visit (HOSPITAL_COMMUNITY): Payer: BC Managed Care – PPO

## 2011-08-31 ENCOUNTER — Ambulatory Visit (HOSPITAL_COMMUNITY)
Admission: RE | Admit: 2011-08-31 | Discharge: 2011-08-31 | Disposition: A | Discharge status: Home / Self Care | Payer: BC Managed Care – PPO | Source: Ambulatory Visit | Attending: Neurosurgery | Admitting: Neurosurgery

## 2011-08-31 ENCOUNTER — Other Ambulatory Visit: Payer: Self-pay | Admitting: Neurosurgery

## 2011-08-31 DIAGNOSIS — M545 Low back pain, unspecified: Secondary | ICD-10-CM

## 2011-08-31 DIAGNOSIS — M79609 Pain in unspecified limb: Secondary | ICD-10-CM | POA: Insufficient documentation

## 2011-08-31 DIAGNOSIS — M7989 Other specified soft tissue disorders: Secondary | ICD-10-CM

## 2011-08-31 DIAGNOSIS — M79606 Pain in leg, unspecified: Secondary | ICD-10-CM

## 2011-08-31 LAB — DIFFERENTIAL
Basophils Absolute: 0 10*3/uL (ref 0.0–0.1)
Basophils Relative: 0 % (ref 0–1)
Eosinophils Absolute: 0.1 10*3/uL (ref 0.0–0.7)
Eosinophils Relative: 1 % (ref 0–5)
Lymphocytes Relative: 5 % — ABNORMAL LOW (ref 12–46)
Lymphs Abs: 0.5 10*3/uL — ABNORMAL LOW (ref 0.7–4.0)
Monocytes Absolute: 0.6 10*3/uL (ref 0.1–1.0)
Monocytes Relative: 7 % (ref 3–12)
Neutro Abs: 8.1 10*3/uL — ABNORMAL HIGH (ref 1.7–7.7)
Neutrophils Relative %: 88 % — ABNORMAL HIGH (ref 43–77)

## 2011-08-31 LAB — CBC
HCT: 37.3 % (ref 36.0–46.0)
Hemoglobin: 12.6 g/dL (ref 12.0–15.0)
MCH: 29.3 pg (ref 26.0–34.0)
MCHC: 33.8 g/dL (ref 30.0–36.0)
MCV: 86.7 fL (ref 78.0–100.0)
Platelets: 288 10*3/uL (ref 150–400)
RBC: 4.3 MIL/uL (ref 3.87–5.11)
RDW: 16.4 % — ABNORMAL HIGH (ref 11.5–15.5)
WBC: 9.2 10*3/uL (ref 4.0–10.5)

## 2011-08-31 LAB — PROCALCITONIN: Procalcitonin: 0.77 ng/mL

## 2011-08-31 LAB — C-REACTIVE PROTEIN: CRP: 0.61 mg/dL — ABNORMAL HIGH (ref <0.60)

## 2011-08-31 LAB — SEDIMENTATION RATE: Sed Rate: 7 mm/hr (ref 0–22)

## 2011-08-31 NOTE — Progress Notes (Signed)
Bilateral lower extremity venous duplex completed.  Preliminary report is negative for DVT, SVT, or a Baker's cyst. 

## 2011-09-06 ENCOUNTER — Ambulatory Visit
Admission: RE | Admit: 2011-09-06 | Discharge: 2011-09-06 | Disposition: A | Discharge status: Home / Self Care | Payer: BC Managed Care – PPO | Source: Ambulatory Visit | Attending: Neurosurgery | Admitting: Neurosurgery

## 2011-09-06 DIAGNOSIS — M545 Low back pain, unspecified: Secondary | ICD-10-CM

## 2011-12-11 ENCOUNTER — Other Ambulatory Visit: Payer: Self-pay | Admitting: Neurosurgery

## 2011-12-12 ENCOUNTER — Other Ambulatory Visit: Payer: Self-pay | Admitting: Neurosurgery

## 2011-12-18 ENCOUNTER — Encounter (HOSPITAL_COMMUNITY): Payer: Self-pay | Admitting: Respiratory Therapy

## 2011-12-21 ENCOUNTER — Encounter (HOSPITAL_COMMUNITY): Payer: Self-pay

## 2011-12-21 ENCOUNTER — Encounter (HOSPITAL_COMMUNITY)
Admission: RE | Admit: 2011-12-21 | Discharge: 2011-12-21 | Disposition: A | Discharge status: Home / Self Care | Payer: BC Managed Care – PPO | Source: Ambulatory Visit | Attending: Neurosurgery | Admitting: Neurosurgery

## 2011-12-21 HISTORY — DX: Other reaction to spinal and lumbar puncture: G97.1

## 2011-12-21 HISTORY — DX: Family history of other specified conditions: Z84.89

## 2011-12-21 HISTORY — DX: Unspecified asthma, uncomplicated: J45.909

## 2011-12-21 HISTORY — DX: Chronic obstructive pulmonary disease, unspecified: J44.9

## 2011-12-21 LAB — SURGICAL PCR SCREEN
MRSA, PCR: NEGATIVE
Staphylococcus aureus: POSITIVE — AB

## 2011-12-21 LAB — BASIC METABOLIC PANEL
BUN: 5 mg/dL — ABNORMAL LOW (ref 6–23)
CO2: 27 mEq/L (ref 19–32)
Calcium: 9.4 mg/dL (ref 8.4–10.5)
Chloride: 101 mEq/L (ref 96–112)
Creatinine, Ser: 0.51 mg/dL (ref 0.50–1.10)
GFR calc Af Amer: >90 mL/min (ref >90)
GFR calc non Af Amer: >90 mL/min (ref >90)
Glucose, Bld: 95 mg/dL (ref 70–99)
Potassium: 4.3 mEq/L (ref 3.5–5.1)
Sodium: 139 mEq/L (ref 135–145)

## 2011-12-21 LAB — HCG, SERUM, QUALITATIVE: Preg, Serum: NEGATIVE

## 2011-12-21 LAB — CBC
HCT: 38.7 % (ref 36.0–46.0)
Hemoglobin: 12.9 g/dL (ref 12.0–15.0)
MCH: 29.9 pg (ref 26.0–34.0)
MCHC: 33.3 g/dL (ref 30.0–36.0)
MCV: 89.6 fL (ref 78.0–100.0)
Platelets: 269 10*3/uL (ref 150–400)
RBC: 4.32 MIL/uL (ref 3.87–5.11)
RDW: 13.2 % (ref 11.5–15.5)
WBC: 6.9 10*3/uL (ref 4.0–10.5)

## 2011-12-21 NOTE — Pre-Procedure Instructions (Signed)
20 Kaitlyn Diaz  12/21/2011   Your procedure is scheduled on:  Friday September 20  Report to [facility] Stay [facility] at 5:30 AM.  Call this number if you have problems the morning of surgery: [phone]   Remember:   Do not eat or drink:After Midnight.    Take these medicines the morning of surgery with A SIP OF WATER: Xanax (alpraxolam), Coreg (carvedilol), Neurontin (gabapentin), Prilosec (omeprazole). May take Percocet and Flexeril. May use Albuterol inhaler, bring to surgery.    Do not wear jewelry, make-up or nail polish.  Do not wear lotions, powders, or perfumes. You may wear deodorant.  Do not shave 48 hours prior to surgery. Men may shave face and neck.  Do not bring valuables to the hospital.  Contacts, dentures or bridgework may not be worn into surgery.  Leave suitcase in the car. After surgery it may be brought to your room.  For patients admitted to the hospital, checkout time is 11:00 AM the day of discharge.   Patients discharged the day of surgery will not be allowed to drive home.  Name and phone number of your driver: NA  Special Instructions: CHG Shower Use Special Wash: 1/2 bottle night before surgery and 1/2 bottle morning of surgery.   Please read over the following fact sheets that you were given: Pain Booklet, Coughing and Deep Breathing and Surgical Site Infection Prevention

## 2011-12-21 NOTE — Progress Notes (Addendum)
Spoke with Erie Noe in Dr. Lonie Peak office, need orders signed.   Chart left for review by anesthesia re: hx of anesthesia complications in pt and family. Pt recently had surgery on 08/01/11, see Anesthesia note from Dr. Michelle Piper; no complications noted. Anesthesia records from prior bladder surgery in 10/2009 on chart.

## 2011-12-25 NOTE — Anesthesia Preprocedure Evaluation (Addendum)
Anesthesia Evaluation  Patient identified by MRN, date of birth, ID band Patient awake  General Assessment Comment:Reports personal and family history of MH  Reviewed: Allergy & Precautions, H&P , NPO status , Patient's Chart, lab work & pertinent test results, reviewed documented beta blocker date and time   History of Anesthesia Complications (+) PONV, MALIGNANT HYPERTHERMIA, POST - OP SPINAL HEADACHE and Family history of anesthesia reaction  Airway Mallampati: II  Neck ROM: full    Dental  (+) Edentulous Upper and Dental Advisory Given   Pulmonary asthma , pneumonia -, COPD COPD inhaler,          Cardiovascular hypertension, Pt. on medications and Pt. on home beta blockers     Neuro/Psych  Headaches, Anxiety  Neuromuscular disease    GI/Hepatic hiatal hernia, GERD-  Medicated,  Endo/Other    Renal/GU      Musculoskeletal  (+) Arthritis -,   Abdominal   Peds  Hematology   Anesthesia Other Findings   Reproductive/Obstetrics                         Anesthesia Physical Anesthesia Plan  ASA: II  Anesthesia Plan: General   Post-op Pain Management:    Induction: Intravenous  Airway Management Planned: Oral ETT  Additional Equipment:   Intra-op Plan:   Post-operative Plan: Extubation in OR  Informed Consent: I have reviewed the patients History and Physical, chart, labs and discussed the procedure including the risks, benefits and alternatives for the proposed anesthesia with the patient or authorized representative who has indicated his/her understanding and acceptance.     Plan Discussed with: CRNA and Surgeon  Anesthesia Plan Comments: (See Anesthesia Note re: Malignant Hyperthermia history.  Shonna Chock, PA-C Will plan to administer a trigger free anesthetic. Achille Rich MD)       Anesthesia Quick Evaluation

## 2011-12-25 NOTE — Consult Note (Signed)
Anesthesia Chart Review:  Patient is a 46 year old female posted for 2 level ACDF on 12/28/11 by Dr. Wynetta Emery.  Currently, it is scheduled as a first case.  I was not asked to speak with her at her PAT visit on 12/21/11, but I did meet with her about her anesthesia history of MALIGNANT HYPERTHERMIA during her PAT visit in April 2013 prior to her L4-5 PLIF.  MH trigger-free general anesthesia was utilized (see Anesthesia Record in Epic).  History includes HTN, hypertensive cardiomyopathy associated with pregnancy induced HTN/pre-eclampsia 20 years ago (reportedly had a "normal" follow-up echo 1 year later), anemia, GERD, headaches, arthritis, hiatal hernia, anxiety, smoking, asthma, PNA (last 2011), neuropathy involving her LLE. She has had prior T&A, breast surgery, and BTL. Her PCP is Syble Creek, ANP at [facility] in [place].   Anesthesia complications include personal and family history of MALIGNANT HYPERTHERMIA and severe post-operative N/V. She has had a maternal uncle die from complications of MH, and both her mother and sister have a MH history. None have been formally tested, but she said over twenty years ago she had a hard surgery with a failed block that required GA and her temperature went up to 106. She also says that she woke up during her tonsillectomy at age 20 and during a "sling" procedure (Urologist Dr. Lindley Magnus) in [facility] in 2010.   EKG from PCP office on 06/28/11 showed NSR.  CXR on 07/25/11 showed no acute cardiopulmonary process. She was evaluated by Pulmonologist Dr. Bethanie Dicker in December 2011. PFTs were done and did not show clearcut evidence of obstructive or restrictive defect. (FEV1 2.45 with a predicted value of 93%.)   (Copies of the above mentioned EKG, Pulmonary notes, and prior 2011 anesthesia record from [facility] can be viewed under the Media tab for Correspondence Encounter date 08/01/11.)  Labs from 12/21/11 noted.  I will notify OR  scheduling about MH history.  Shonna Chock, PA-C

## 2011-12-27 MED ORDER — DEXAMETHASONE SODIUM PHOSPHATE 10 MG/ML IJ SOLN
10.0000 mg | INTRAMUSCULAR | Status: AC
  Administered 2011-12-28: 10 mg via INTRAVENOUS
  Filled 2011-12-27: qty 1

## 2011-12-27 MED ORDER — VANCOMYCIN HCL IN DEXTROSE 1-5 GM/200ML-% IV SOLN
1000.0000 mg | INTRAVENOUS | Status: AC
  Administered 2011-12-28: 1000 mg via INTRAVENOUS
  Filled 2011-12-27: qty 200

## 2011-12-28 ENCOUNTER — Ambulatory Visit (HOSPITAL_COMMUNITY)
Admission: RE | Admit: 2011-12-28 | Discharge: 2011-12-28 | Disposition: A | Discharge status: Home / Self Care | Payer: BC Managed Care – PPO | Source: Ambulatory Visit | Attending: Neurosurgery | Admitting: Neurosurgery

## 2011-12-28 ENCOUNTER — Encounter (HOSPITAL_COMMUNITY): Payer: Self-pay | Admitting: *Deleted

## 2011-12-28 ENCOUNTER — Ambulatory Visit (HOSPITAL_COMMUNITY): Payer: BC Managed Care – PPO | Admitting: Vascular Surgery

## 2011-12-28 ENCOUNTER — Encounter (HOSPITAL_COMMUNITY): Payer: Self-pay | Admitting: Vascular Surgery

## 2011-12-28 ENCOUNTER — Ambulatory Visit (HOSPITAL_COMMUNITY): Payer: BC Managed Care – PPO

## 2011-12-28 ENCOUNTER — Encounter (HOSPITAL_COMMUNITY)
Admission: RE | Disposition: A | Discharge status: Home / Self Care | Payer: Self-pay | Source: Ambulatory Visit | Attending: Neurosurgery

## 2011-12-28 DIAGNOSIS — J4489 Other specified chronic obstructive pulmonary disease: Secondary | ICD-10-CM | POA: Insufficient documentation

## 2011-12-28 DIAGNOSIS — Z23 Encounter for immunization: Secondary | ICD-10-CM | POA: Insufficient documentation

## 2011-12-28 DIAGNOSIS — Z01812 Encounter for preprocedural laboratory examination: Secondary | ICD-10-CM | POA: Insufficient documentation

## 2011-12-28 DIAGNOSIS — J449 Chronic obstructive pulmonary disease, unspecified: Secondary | ICD-10-CM | POA: Insufficient documentation

## 2011-12-28 DIAGNOSIS — I10 Essential (primary) hypertension: Secondary | ICD-10-CM | POA: Insufficient documentation

## 2011-12-28 DIAGNOSIS — M47812 Spondylosis without myelopathy or radiculopathy, cervical region: Secondary | ICD-10-CM | POA: Insufficient documentation

## 2011-12-28 HISTORY — PX: ANTERIOR CERVICAL DECOMP/DISCECTOMY FUSION: SHX1161

## 2011-12-28 SURGERY — ANTERIOR CERVICAL DECOMPRESSION/DISCECTOMY FUSION 2 LEVELS
Anesthesia: General | Site: Neck | Wound class: Clean

## 2011-12-28 MED ORDER — MIDAZOLAM HCL 5 MG/5ML IJ SOLN
INTRAMUSCULAR | Status: DC | PRN
  Administered 2011-12-28: 2 mg via INTRAVENOUS

## 2011-12-28 MED ORDER — CARVEDILOL 25 MG PO TABS: 25.0000 mg | ORAL_TABLET | Freq: Every day | ORAL | Status: DC

## 2011-12-28 MED ORDER — FENTANYL CITRATE 0.05 MG/ML IJ SOLN
INTRAMUSCULAR | Status: DC | PRN
  Administered 2011-12-28: 50 ug via INTRAVENOUS
  Administered 2011-12-28 (×3): 100 ug via INTRAVENOUS
  Administered 2011-12-28 (×3): 50 ug via INTRAVENOUS

## 2011-12-28 MED ORDER — BACITRACIN 50000 UNITS IM SOLR
INTRAMUSCULAR | Status: AC
  Filled 2011-12-28: qty 1

## 2011-12-28 MED ORDER — PROPOFOL 10 MG/ML IV BOLUS
INTRAVENOUS | Status: DC | PRN
  Administered 2011-12-28: 160 mg via INTRAVENOUS

## 2011-12-28 MED ORDER — PANTOPRAZOLE SODIUM 40 MG PO TBEC
40.0000 mg | DELAYED_RELEASE_TABLET | Freq: Every day | ORAL | Status: DC
  Filled 2011-12-28: qty 1

## 2011-12-28 MED ORDER — ONDANSETRON HCL 4 MG/2ML IJ SOLN
INTRAMUSCULAR | Status: DC | PRN
  Administered 2011-12-28 (×2): 4 mg via INTRAVENOUS

## 2011-12-28 MED ORDER — BACITRACIN 50000 UNITS IM SOLR
INTRAMUSCULAR | Status: DC | PRN
  Administered 2011-12-28 (×2)

## 2011-12-28 MED ORDER — MENTHOL 3 MG MT LOZG: 1.0000 | LOZENGE | OROMUCOSAL | Status: DC | PRN

## 2011-12-28 MED ORDER — FLUTICASONE-SALMETEROL 250-50 MCG/DOSE IN AEPB
1.0000 | INHALATION_SPRAY | Freq: Two times a day (BID) | RESPIRATORY_TRACT | Status: DC
  Administered 2011-12-28: 1 via RESPIRATORY_TRACT
  Filled 2011-12-28: qty 14

## 2011-12-28 MED ORDER — SODIUM CHLORIDE 0.9 % IJ SOLN: 3.0000 mL | INTRAMUSCULAR | Status: DC | PRN

## 2011-12-28 MED ORDER — SCOPOLAMINE 1 MG/3DAYS TD PT72
MEDICATED_PATCH | TRANSDERMAL | Status: DC | PRN
  Administered 2011-12-28: 1 via TRANSDERMAL

## 2011-12-28 MED ORDER — SODIUM CHLORIDE 0.9 % IJ SOLN: 3.0000 mL | Freq: Two times a day (BID) | INTRAMUSCULAR | Status: DC

## 2011-12-28 MED ORDER — SCOPOLAMINE 1 MG/3DAYS TD PT72
MEDICATED_PATCH | TRANSDERMAL | Status: AC
  Filled 2011-12-28: qty 1

## 2011-12-28 MED ORDER — VECURONIUM BROMIDE 10 MG IV SOLR
INTRAVENOUS | Status: DC | PRN
  Administered 2011-12-28: 2 mg via INTRAVENOUS
  Administered 2011-12-28: 1 mg via INTRAVENOUS
  Administered 2011-12-28: 2 mg via INTRAVENOUS

## 2011-12-28 MED ORDER — GABAPENTIN 300 MG PO CAPS: 300.0000 mg | ORAL_CAPSULE | Freq: Two times a day (BID) | ORAL | Status: DC

## 2011-12-28 MED ORDER — OXYCODONE HCL 5 MG PO TABS
10.0000 mg | ORAL_TABLET | ORAL | Status: DC | PRN
  Administered 2011-12-28: 10 mg via ORAL
  Filled 2011-12-28: qty 2

## 2011-12-28 MED ORDER — ACETAMINOPHEN 325 MG PO TABS: 650.0000 mg | ORAL_TABLET | ORAL | Status: DC | PRN

## 2011-12-28 MED ORDER — GABAPENTIN 300 MG PO CAPS: 300.0000 mg | ORAL_CAPSULE | Freq: Every day | ORAL | Status: DC

## 2011-12-28 MED ORDER — HYDROMORPHONE HCL PF 1 MG/ML IJ SOLN: 0.5000 mg | INTRAMUSCULAR | Status: DC | PRN

## 2011-12-28 MED ORDER — GABAPENTIN 300 MG PO CAPS
600.0000 mg | ORAL_CAPSULE | Freq: Every day | ORAL | Status: DC
  Filled 2011-12-28: qty 2

## 2011-12-28 MED ORDER — CYCLOBENZAPRINE HCL 10 MG PO TABS: 10.0000 mg | ORAL_TABLET | Freq: Three times a day (TID) | ORAL | Status: DC | PRN

## 2011-12-28 MED ORDER — HYDROMORPHONE HCL PF 1 MG/ML IJ SOLN
0.2500 mg | INTRAMUSCULAR | Status: DC | PRN
  Administered 2011-12-28: 0.5 mg via INTRAVENOUS

## 2011-12-28 MED ORDER — OXYCODONE-ACETAMINOPHEN 5-325 MG PO TABS
2.0000 | ORAL_TABLET | ORAL | Status: DC | PRN
  Administered 2011-12-28: 2 via ORAL
  Filled 2011-12-28: qty 2

## 2011-12-28 MED ORDER — THROMBIN 5000 UNITS EX SOLR
OROMUCOSAL | Status: DC | PRN
  Administered 2011-12-28: 09:00:00 via TOPICAL

## 2011-12-28 MED ORDER — NEOSTIGMINE METHYLSULFATE 1 MG/ML IJ SOLN
INTRAMUSCULAR | Status: DC | PRN
  Administered 2011-12-28: 3 mg via INTRAVENOUS

## 2011-12-28 MED ORDER — THROMBIN 5000 UNITS EX SOLR
CUTANEOUS | Status: DC | PRN
  Administered 2011-12-28: 5000 [IU] via TOPICAL

## 2011-12-28 MED ORDER — OXYCODONE-ACETAMINOPHEN 10-325 MG PO TABS: 2.0000 | ORAL_TABLET | ORAL | Status: DC | PRN

## 2011-12-28 MED ORDER — ONDANSETRON HCL 4 MG/2ML IJ SOLN: 4.0000 mg | Freq: Four times a day (QID) | INTRAMUSCULAR | Status: DC | PRN

## 2011-12-28 MED ORDER — FUROSEMIDE 40 MG PO TABS
40.0000 mg | ORAL_TABLET | Freq: Every day | ORAL | Status: DC
  Administered 2011-12-28: 40 mg via ORAL
  Filled 2011-12-28: qty 1

## 2011-12-28 MED ORDER — LIDOCAINE HCL (CARDIAC) 20 MG/ML IV SOLN
INTRAVENOUS | Status: DC | PRN
  Administered 2011-12-28: 80 mg via INTRAVENOUS

## 2011-12-28 MED ORDER — ALUM & MAG HYDROXIDE-SIMETH 200-200-20 MG/5ML PO SUSP: 30.0000 mL | Freq: Four times a day (QID) | ORAL | Status: DC | PRN

## 2011-12-28 MED ORDER — ACETAMINOPHEN 650 MG RE SUPP: 650.0000 mg | RECTAL | Status: DC | PRN

## 2011-12-28 MED ORDER — VANCOMYCIN HCL IN DEXTROSE 1-5 GM/200ML-% IV SOLN
1000.0000 mg | Freq: Once | INTRAVENOUS | Status: DC
  Filled 2011-12-28: qty 200

## 2011-12-28 MED ORDER — HYDROMORPHONE HCL PF 1 MG/ML IJ SOLN
INTRAMUSCULAR | Status: AC
  Filled 2011-12-28: qty 1

## 2011-12-28 MED ORDER — ONDANSETRON HCL 4 MG/2ML IJ SOLN: 4.0000 mg | INTRAMUSCULAR | Status: DC | PRN

## 2011-12-28 MED ORDER — ALPRAZOLAM 0.5 MG PO TABS
1.0000 mg | ORAL_TABLET | Freq: Three times a day (TID) | ORAL | Status: DC
  Administered 2011-12-28: 1 mg via ORAL
  Filled 2011-12-28: qty 2

## 2011-12-28 MED ORDER — PROPOFOL INFUSION 10 MG/ML OPTIME
INTRAVENOUS | Status: DC | PRN
  Administered 2011-12-28: 100 ug/kg/min via INTRAVENOUS

## 2011-12-28 MED ORDER — CYCLOBENZAPRINE HCL 10 MG PO TABS
10.0000 mg | ORAL_TABLET | Freq: Three times a day (TID) | ORAL | Status: DC | PRN
  Administered 2011-12-28: 10 mg via ORAL
  Filled 2011-12-28: qty 1

## 2011-12-28 MED ORDER — SODIUM CHLORIDE 0.9 % IV SOLN: 250.0000 mL | INTRAVENOUS | Status: DC

## 2011-12-28 MED ORDER — SODIUM CHLORIDE 0.9 % IV SOLN
INTRAVENOUS | Status: AC
  Filled 2011-12-28: qty 500

## 2011-12-28 MED ORDER — CEFAZOLIN SODIUM 1-5 GM-% IV SOLN: 1.0000 g | Freq: Three times a day (TID) | INTRAVENOUS | Status: DC

## 2011-12-28 MED ORDER — PHENOL 1.4 % MT LIQD: 1.0000 | OROMUCOSAL | Status: DC | PRN

## 2011-12-28 MED ORDER — INFLUENZA VIRUS VACC SPLIT PF IM SUSP
0.5000 mL | Freq: Once | INTRAMUSCULAR | Status: AC
  Administered 2011-12-28: 0.5 mL via INTRAMUSCULAR
  Filled 2011-12-28: qty 0.5

## 2011-12-28 MED ORDER — LISINOPRIL 20 MG PO TABS
20.0000 mg | ORAL_TABLET | Freq: Every day | ORAL | Status: DC
  Filled 2011-12-28: qty 1

## 2011-12-28 MED ORDER — ALBUTEROL SULFATE HFA 108 (90 BASE) MCG/ACT IN AERS
1.0000 | INHALATION_SPRAY | RESPIRATORY_TRACT | Status: DC | PRN
  Filled 2011-12-28: qty 6.7

## 2011-12-28 MED ORDER — GLYCOPYRROLATE 0.2 MG/ML IJ SOLN
INTRAMUSCULAR | Status: DC | PRN
  Administered 2011-12-28: 0.4 mg via INTRAVENOUS

## 2011-12-28 MED ORDER — ROCURONIUM BROMIDE 100 MG/10ML IV SOLN
INTRAVENOUS | Status: DC | PRN
  Administered 2011-12-28: 50 mg via INTRAVENOUS

## 2011-12-28 MED ORDER — LACTATED RINGERS IV SOLN
INTRAVENOUS | Status: DC | PRN
  Administered 2011-12-28 (×2): via INTRAVENOUS

## 2011-12-28 SURGICAL SUPPLY — 59 items
BAG DECANTER FOR FLEXI CONT (MISCELLANEOUS) ×2 IMPLANT
BENZOIN TINCTURE PRP APPL 2/3 (GAUZE/BANDAGES/DRESSINGS) ×2 IMPLANT
BRUSH SCRUB EZ PLAIN DRY (MISCELLANEOUS) ×2 IMPLANT
BUR MATCHSTICK NEURO 3.0 LAGG (BURR) ×2 IMPLANT
CANISTER SUCTION 2500CC (MISCELLANEOUS) ×2 IMPLANT
CLOTH BEACON ORANGE TIMEOUT ST (SAFETY) ×2 IMPLANT
CONT SPEC 4OZ CLIKSEAL STRL BL (MISCELLANEOUS) ×2 IMPLANT
DERMABOND ADVANCED (GAUZE/BANDAGES/DRESSINGS) ×1
DERMABOND ADVANCED .7 DNX12 (GAUZE/BANDAGES/DRESSINGS) ×1 IMPLANT
DRAPE C-ARM 42X72 X-RAY (DRAPES) ×4 IMPLANT
DRAPE LAPAROTOMY 100X72 PEDS (DRAPES) ×2 IMPLANT
DRAPE MICROSCOPE ZEISS OPMI (DRAPES) IMPLANT
DRAPE POUCH INSTRU U-SHP 10X18 (DRAPES) ×2 IMPLANT
DRILL BIT (BIT) ×2 IMPLANT
DRSG OPSITE 4X5.5 SM (GAUZE/BANDAGES/DRESSINGS) ×2 IMPLANT
ELECT COATED BLADE 2.86 ST (ELECTRODE) ×2 IMPLANT
ELECT REM PT RETURN 9FT ADLT (ELECTROSURGICAL) ×2
ELECTRODE REM PT RTRN 9FT ADLT (ELECTROSURGICAL) ×1 IMPLANT
GAUZE SPONGE 4X4 16PLY XRAY LF (GAUZE/BANDAGES/DRESSINGS) IMPLANT
GLOVE BIO SURGEON STRL SZ 6.5 (GLOVE) ×4 IMPLANT
GLOVE BIO SURGEON STRL SZ8 (GLOVE) ×2 IMPLANT
GLOVE BIOGEL PI IND STRL 8.5 (GLOVE) ×1 IMPLANT
GLOVE BIOGEL PI INDICATOR 8.5 (GLOVE) ×1
GLOVE ECLIPSE 8.5 STRL (GLOVE) ×2 IMPLANT
GLOVE EXAM NITRILE LRG STRL (GLOVE) IMPLANT
GLOVE EXAM NITRILE MD LF STRL (GLOVE) ×2 IMPLANT
GLOVE EXAM NITRILE XL STR (GLOVE) IMPLANT
GLOVE EXAM NITRILE XS STR PU (GLOVE) IMPLANT
GLOVE INDICATOR 8.5 STRL (GLOVE) ×2 IMPLANT
GLOVE SURG SS PI 6.5 STRL IVOR (GLOVE) ×2 IMPLANT
GOWN BRE IMP SLV AUR LG STRL (GOWN DISPOSABLE) IMPLANT
GOWN BRE IMP SLV AUR XL STRL (GOWN DISPOSABLE) ×2 IMPLANT
GOWN STRL REIN 2XL LVL4 (GOWN DISPOSABLE) IMPLANT
HEAD HALTER (SOFTGOODS) ×2 IMPLANT
HEMOSTAT POWDER KIT SURGIFOAM (HEMOSTASIS) ×2 IMPLANT
KIT BASIN OR (CUSTOM PROCEDURE TRAY) ×2 IMPLANT
KIT ROOM TURNOVER OR (KITS) ×2 IMPLANT
NEEDLE SPNL 20GX3.5 QUINCKE YW (NEEDLE) ×2 IMPLANT
NS IRRIG 1000ML POUR BTL (IV SOLUTION) ×2 IMPLANT
PACK LAMINECTOMY NEURO (CUSTOM PROCEDURE TRAY) ×2 IMPLANT
PAD ARMBOARD 7.5X6 YLW CONV (MISCELLANEOUS) ×6 IMPLANT
PLATE 2 40XLCK NS SPNE CVD (Plate) ×1 IMPLANT
PLATE 2 ATLANTIS TRANS (Plate) ×1 IMPLANT
PUTTY BONE DBX 2.5 MIS (Bone Implant) ×2 IMPLANT
RUBBERBAND STERILE (MISCELLANEOUS) ×4 IMPLANT
SCREW ST FIX 4 ATL 3120213 (Screw) ×12 IMPLANT
SPACER COLONIAL LGE 8MM 7DEG (Spacer) ×4 IMPLANT
SPONGE GAUZE 4X4 12PLY (GAUZE/BANDAGES/DRESSINGS) ×4 IMPLANT
SPONGE INTESTINAL PEANUT (DISPOSABLE) ×2 IMPLANT
SPONGE SURGIFOAM ABS GEL SZ50 (HEMOSTASIS) ×2 IMPLANT
STRIP CLOSURE SKIN 1/2X4 (GAUZE/BANDAGES/DRESSINGS) ×2 IMPLANT
SUT VIC AB 3-0 SH 8-18 (SUTURE) ×2 IMPLANT
SUT VICRYL 4-0 PS2 18IN ABS (SUTURE) ×2 IMPLANT
SYR 20ML ECCENTRIC (SYRINGE) ×2 IMPLANT
TAPE CLOTH 4X10 WHT NS (GAUZE/BANDAGES/DRESSINGS) IMPLANT
TOWEL OR 17X24 6PK STRL BLUE (TOWEL DISPOSABLE) ×2 IMPLANT
TOWEL OR 17X26 10 PK STRL BLUE (TOWEL DISPOSABLE) ×2 IMPLANT
TRAP SPECIMEN MUCOUS 40CC (MISCELLANEOUS) ×2 IMPLANT
WATER STERILE IRR 1000ML POUR (IV SOLUTION) ×2 IMPLANT

## 2011-12-28 NOTE — Anesthesia Procedure Notes (Signed)
Procedure Name: Intubation Date/Time: 12/28/2011 7:37 AM Performed by: Garen Lah Pre-anesthesia Checklist: Patient identified, Timeout performed, Emergency Drugs available, Suction available and Patient being monitored Patient Re-evaluated:Patient Re-evaluated prior to inductionOxygen Delivery Method: Circle system utilized Preoxygenation: Pre-oxygenation with 100% oxygen Intubation Type: IV induction Ventilation: Mask ventilation without difficulty Laryngoscope Size: Mac and 3 Grade View: Grade II Tube type: Oral Tube size: 7.5 mm Number of attempts: 1 Airway Equipment and Method: Stylet Placement Confirmation: ETT inserted through vocal cords under direct vision,  breath sounds checked- equal and bilateral and positive ETCO2 Secured at: 20 cm Tube secured with: Tape Dental Injury: Teeth and Oropharynx as per pre-operative assessment

## 2011-12-28 NOTE — Addendum Note (Signed)
Addendum  created 12/28/11 1014 by Raiford Simmonds, MD   Modules edited:Anesthesia Attestations

## 2011-12-28 NOTE — Anesthesia Postprocedure Evaluation (Signed)
Anesthesia Post Note  Patient: Kaitlyn Diaz  Procedure(s) Performed: Procedure(s) (LRB): ANTERIOR CERVICAL DECOMPRESSION/DISCECTOMY FUSION 2 LEVELS (N/A)  Anesthesia type: General  Patient location: PACU  Post pain: Pain level controlled and Adequate analgesia  Post assessment: Post-op Vital signs reviewed, Patient's Cardiovascular Status Stable, Respiratory Function Stable, Patent Airway and Pain level controlled  Last Vitals:  Filed Vitals:   12/28/11 0954  BP:   Pulse:   Temp: 36.7 C  Resp:     Post vital signs: Reviewed and stable  Level of consciousness: awake, alert  and oriented  Complications: No apparent anesthesia complications

## 2011-12-28 NOTE — H&P (Signed)
Kaitlyn Diaz is an 46 y.o. female.   Chief Complaint: Neck and left arm pain HPI: Patient is a very pleasant 46 year old female who presents with neck and prominent left arm pain with numbness tingling in her left arm and weakness in her left arm. This pain began progressively after her lumbar spine surgery and workup has included an EMG nerve conduction test as well as MRI scan of her neck which showed significant spinal cord compression cervical spondylosis with stenosis and C6-C7 nerve root compression predominantly in the left. Due to her failure conservative treatment with exercises treatments anti-inflammatories and pain management, progression of clinical syndrome and imaging findings she has been recommended an anterior cervical discectomy and fusion at C5-6 C6-7. I extensively reviewed the risks and benefits of the operation as well as therapy course and expectations of outcome alternatives surgery and she understands and agrees to proceed forward.  Past Medical History  Diagnosis Date  . Complication of anesthesia     Difficult to put to sleep, Has run fever, "runs in famly"  . PONV (postoperative nausea and vomiting)   . Enlarged heart     hx of  . Pneumonia     hx of pneumonia x 2  . Anemia   . Urinary tract infection     and kidney infection Hx of  . GERD (gastroesophageal reflux disease)   . H/O hiatal hernia     hx of  . Headache     "due to Blood pressure"  . Neuromuscular disorder     "85 % nerve damage in left leg"  . Arthritis   . Anxiety     "panic attacks, claustophobic on xanax"  . Malignant hyperthermia   . Hypertension     Dr. Syble Creek, medical physician [phone]  . Family history of anesthesia complication     malignant hyperthermia on mother's side of family  . Spinal headache   . Asthma   . COPD (chronic obstructive pulmonary disease)     Past Surgical History  Procedure Date  . Tonsillectomy     and adnoids  . Hand mass     1994, multiple  masses removed - "all benign"  . Leg mass     left leg  surgically removed  . Breast surgery     left breast mass removed  . Tubal ligation   . Bladder suspension   . Posterior fusion lumbar spine     L4/5    History reviewed. No pertinent family history. Social History:  reports that she has been smoking Cigarettes.  She has a 34 pack-year smoking history. She does not have any smokeless tobacco history on file. She reports that she drinks alcohol. She reports that she does not use illicit drugs.  Allergies:  Allergies  Allergen Reactions  . Amoxicillin Anaphylaxis, Itching and Swelling  . Codeine Other (See Comments)    nausea  . Oxycodone Hcl Er Other (See Comments)    hallucinations    Medications Prior to Admission  Medication Sig Dispense Refill  . albuterol (PROVENTIL HFA;VENTOLIN HFA) 108 (90 BASE) MCG/ACT inhaler Inhale 1 puff into the lungs 3 (three) times daily as needed. For shortness of breath      . ALPRAZolam (XANAX) 1 MG tablet Take 1 mg by mouth 3 (three) times daily.      . carvedilol (COREG) 12.5 MG tablet Take 25 mg by mouth daily.       . cyclobenzaprine (FLEXERIL) 10 MG tablet Take 10  mg by mouth 3 (three) times daily as needed. For muscle pain      . Fluticasone-Salmeterol (ADVAIR) 250-50 MCG/DOSE AEPB Inhale 1 puff into the lungs at bedtime.       . furosemide (LASIX) 20 MG tablet Take 40 mg by mouth daily.      Marland Kitchen gabapentin (NEURONTIN) 300 MG capsule Take 300-600 mg by mouth 2 (two) times daily. Take 1 cap in the morning and 2 caps at bedtime      . lisinopril (PRINIVIL,ZESTRIL) 20 MG tablet Take 20 mg by mouth daily.      Marland Kitchen omeprazole (PRILOSEC) 40 MG capsule Take 40 mg by mouth daily.       Marland Kitchen oxyCODONE-acetaminophen (PERCOCET) 10-325 MG per tablet Take 2 tablets by mouth every 4 (four) hours as needed. For pain        No results found for this or any previous visit (from the past 48 hour(s)). No results found.  Review of Systems  Constitutional:  Negative.   HENT: Negative.   Eyes: Negative.   Respiratory: Negative.   Cardiovascular: Negative.   Gastrointestinal: Negative.   Genitourinary: Negative.   Musculoskeletal: Negative.   Skin: Negative.   Neurological: Negative.   Endo/Heme/Allergies: Negative.   Psychiatric/Behavioral: Negative.     Blood pressure 142/84, pulse 67, temperature 98 F (36.7 C), temperature source Oral, resp. rate 18, last menstrual period 07/28/2011, SpO2 99.00%. Physical Exam  Constitutional: She is oriented to person, place, and time. She appears well-developed and well-nourished.  HENT:  Head: Normocephalic.  Eyes: Pupils are equal, round, and reactive to light.  Neck: Normal range of motion.  Respiratory: Effort normal and breath sounds normal.  GI: Soft. Bowel sounds are normal.  Neurological: She is alert and oriented to person, place, and time. GCS eye subscore is 4. GCS verbal subscore is 5. GCS motor subscore is 6.  Reflex Scores:      Tricep reflexes are 1+ on the right side and 1+ on the left side.      Bicep reflexes are 1+ on the right side and 1+ on the left side.      Brachioradialis reflexes are 1+ on the right side and 1+ on the left side.      Patellar reflexes are 2+ on the right side and 2+ on the left side.      Achilles reflexes are 2+ on the right side and 2+ on the left side.      Strength is 5 out of 5 on the right upper extremity in her deltoids, biceps, triceps, wrist flexion, wrist extension, and intrinsics in the left upper gym he she has some pain limited weakness to duty she has some weakness and rated about 4+ out of 5 in the left tricep and left bicep wrist and hand intrinsics are also 4+ out of 5     Assessment/Plan 45 year old female presents for an ACDF at C5-6 and C6-7  [name] P 12/28/2011, 6:59 AM

## 2011-12-28 NOTE — Addendum Note (Signed)
Addendum  created 12/28/11 1013 by Raiford Simmonds, MD   Modules edited:Anesthesia Attestations

## 2011-12-28 NOTE — Transfer of Care (Signed)
Immediate Anesthesia Transfer of Care Note  Patient: Kaitlyn Diaz  Procedure(s) Performed: Procedure(s) (LRB) with comments: ANTERIOR CERVICAL DECOMPRESSION/DISCECTOMY FUSION 2 LEVELS (N/A) - HISTORY OF MALIGNANT HYPERTHERMIA Anterior Cervical Discectomy Fusion of Cervical five-six, Cervical six-seven  Patient Location: PACU  Anesthesia Type: General  Level of Consciousness: awake, alert  and oriented  Airway & Oxygen Therapy: Patient Spontanous Breathing and Patient connected to face mask oxygen  Post-op Assessment: Report given to PACU RN and Post -op Vital signs reviewed and stable  Post vital signs: Reviewed  Complications: No apparent anesthesia complications

## 2011-12-28 NOTE — Op Note (Signed)
Preoperative diagnosis: Cervical spondylosis with stenosis and radiculopathy C5-6 C6-7  Postoperative diagnosis: Same  Procedure: Anterior cervical discectomy and fusion at C5-6 and C6-7 using globus peek cages 8 mm packed with local autograft mixed with DBX and Atlantis translational plate 40 mm with 6-13 mm variable angle screws  Surgeon: Jillyn Hidden Devaunte Gasparini  Assistant: Barnett Abu  Anesthesia: Gen.  EBL: Minimal  History of present illness: Patient is a very pleasant 46 year old female presents with progressive worsening neck and prominent left shoulder and arm pain rate and C6-C7 distribution. Urgent revealed weakness in her triceps and intrinsics induced a conservative treatment progression of clinical syndrome and imaging findings patient recommended anterior cervical discectomies and fusion at C5-6 and C6-7. I extensively the risks benefits of the operation as well as the perioperative course and expectations of outcome and alternatives surgery and she understands and agreed to proceed forward.  Operative procedure: Patient brought into the or was induced under general anesthesia positioned supine the neck in slight extension in 5 pounds of halter traction the right 7 and was prepped and draped in routine sterile fashion. Preoperative localizing appropriate level so a curvilinear incision was made just up midline to a number of the sternomastoid and the superficial layer of the platysmas dissected out and divided longitudinally then the avascular tissue sternomastoid muscle was developed down to the prevertebral fascia and this was dissected away with Kitners. Interoperative X. identify the appropriate level so a large anterior osteophyte coming off the C5-6 disc space was bitten away and both annulotomy is were made with a 15 scalpel lungs close with the laterally and self-retaining retractors placed. Both the space is then were incised pituitary rongeurs she were used to remove the anterior margin of  the annulus BA curette to scrape the endplates and both the space were subsequently drilled down capturing the bone shavings in a mucous trap. Under microscopic illumination first working at C5-6 the disc space was further drilled down aggressive abutting both endplates were denied identify the PLL and large posterior aspect of the C5-C6 vertebral body. There is also large free fragment this was expressed after getting up underneath the PLL. Then aggressive and viable template is carried out laterally to the level of C6 pedicle both C6 nerve roots, social pedicle. After the discectomy there is no further stenosis on either C6 foramen were nerve root and the central canal was widely decompressed. This is packed with Gelfoam to second C6-7 and a similar fashion C6 and was drilled down capturing the bone shavings in a mucous trap large posterior spur coming off the C6 vertebral body displacing the medial central and rightward and spinal cord was bitten the decompress the central canal both C7 pedicles were identified both C7 nerve roots, social pedicle. At the discectomy there is no further stenosis centrally or foraminally. Platysmas and copiously irrigated end plates were scraped with a BA curette to 8 mm cage packed with local autograft mixed with active use and inserted and a 40/social plate was placed all screws excellent purchase locking mechanisms were engaged wounds and copiously irrigated and closed in layers with after Vicryl and the skin was closed running 4 subcuticular benzo and Steri-Strips were applied patient recovered in stable condition. At the end of case on it counts and sponge counts were correct.

## 2011-12-28 NOTE — Preoperative (Signed)
Beta Blockers   Reason not to administer Beta Blockers:Not Applicable. Coreg @ 0500 12/28/11

## 2011-12-28 NOTE — Progress Notes (Signed)
Pt. Tolerated procedure well, Pt. Alert and oriented,follows simple instructions, denies pain. Incision area without swelling, redness or S/S of infection. Voiding adequate clear yellow urine. Moving all extremities well and vitals stable and documented. Anterior Cervical surgery notes instructions and scripts given to patient and family member for home safety and precautions.Pt and family stated understanding of instructions given.

## 2011-12-28 NOTE — Discharge Summary (Signed)
  Physician Discharge Summary  Patient ID: Kaitlyn Diaz MRN: [id number] DOB/AGE: 1965-04-12 46 y.o.  Admit date: 12/28/2011 Discharge date: 12/28/2011  Admission Diagnoses: Cervical spondylosis with radiculopathy C5-6 C6-7  Discharge Diagnoses: Same Active Problems:  * No active hospital problems. *    Discharged Condition: good  Hospital Course: Patient was admitted to hospital underwent an ACDF at [facility] and C6-7 postoperatively patient did very well recovered in the floor on the floor patient was convalescing well ambulating and voiding spontaneously tolerating regular diet was he'll be discharged home scheduled followup 1-2 weeks.  Consults: Significant Diagnostic Studies: Treatments: ACDF C5-6 C6-7 Discharge Exam: Blood pressure 142/84, pulse 67, temperature 98 F (36.7 C), temperature source Oral, resp. rate 18, last menstrual period 07/28/2011, SpO2 99.00%. Strength out of 5 in the right upper 70 x baseline weakness 4+ out of 5 wound flat  Disposition: Home     Medication List     As of 12/28/2011 10:08 AM    TAKE these medications         albuterol 108 (90 BASE) MCG/ACT inhaler   Commonly known as: PROVENTIL HFA;VENTOLIN HFA   Inhale 1 puff into the lungs 3 (three) times daily as needed. For shortness of breath      ALPRAZolam 1 MG tablet   Commonly known as: XANAX   Take 1 mg by mouth 3 (three) times daily.      carvedilol 12.5 MG tablet   Commonly known as: COREG   Take 25 mg by mouth daily.      cyclobenzaprine 10 MG tablet   Commonly known as: FLEXERIL   Take 10 mg by mouth 3 (three) times daily as needed. For muscle pain      cyclobenzaprine 10 MG tablet   Commonly known as: FLEXERIL   Take 1 tablet (10 mg total) by mouth 3 (three) times daily as needed for muscle spasms.      Fluticasone-Salmeterol 250-50 MCG/DOSE Aepb   Commonly known as: ADVAIR   Inhale 1 puff into the lungs at bedtime.      furosemide 20 MG tablet   Commonly known as: LASIX   Take 40 mg by mouth daily.      gabapentin 300 MG capsule   Commonly known as: NEURONTIN   Take 300-600 mg by mouth 2 (two) times daily. Take 1 cap in the morning and 2 caps at bedtime      lisinopril 20 MG tablet   Commonly known as: PRINIVIL,ZESTRIL   Take 20 mg by mouth daily.      omeprazole 40 MG capsule   Commonly known as: PRILOSEC   Take 40 mg by mouth daily.      oxyCODONE-acetaminophen 10-325 MG per tablet   Commonly known as: PERCOCET   Take 2 tablets by mouth every 4 (four) hours as needed. For pain      oxyCODONE-acetaminophen 10-325 MG per tablet   Commonly known as: PERCOCET   Take 2 tablets by mouth every 4 (four) hours as needed.         Signed: Aijalon Demuro P 12/28/2011, 10:08 AM

## 2012-01-01 ENCOUNTER — Encounter (HOSPITAL_COMMUNITY): Payer: Self-pay | Admitting: Neurosurgery

## 2012-11-05 IMAGING — CT CT L SPINE W/O CM
4 of 10 series · 11 of 33 positions shown, 13 images · non-contrast
Comparison: Lumbar spine radiographs 08/16/2011 and 08/30/2011 at
[REDACTED].

CLINICAL DATA: Neck pain.  Bilateral leg pain.  Bilateral feet
swelling.  Right hip and ankle pain.

CT LUMBAR SPINE WITHOUT CONTRAST
TECHNIQUE: Multidetector CT imaging of the lumbar spine was
performed without intravenous contrast administration. Multiplanar
CT image reconstructions were also generated.

[Series 4: l spine bone · axial · 0.27mm/px · z∈[-224,-141]mm · 2 of 100 slices shown]
[im 34/100  bone]
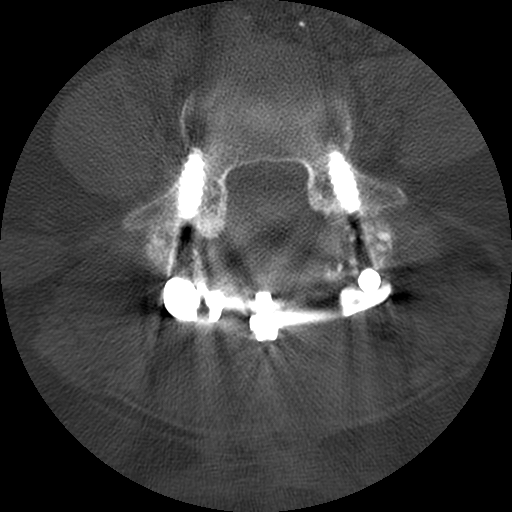
[im 67/100  bone]
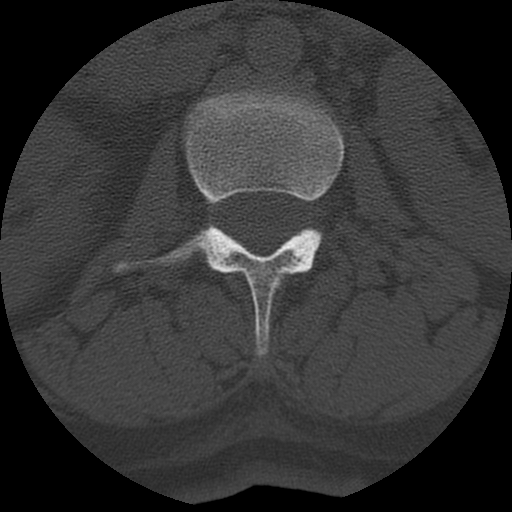

[Series 5: l spine detail · axial · 0.27mm/px · z∈[-246,-121]mm · 3 of 100 slices shown, 4 images]
[im 25/100  soft-tissue]
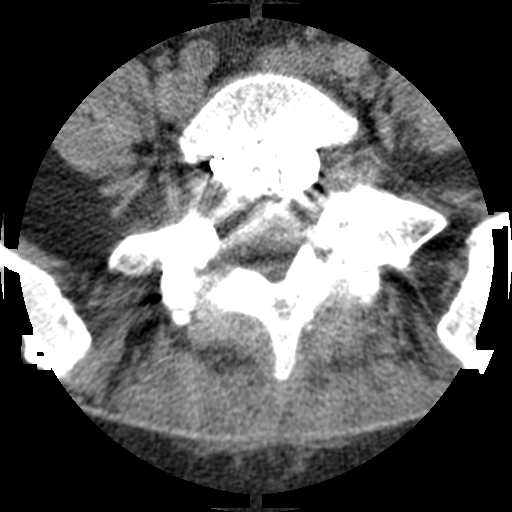
[im 25/100  bone]
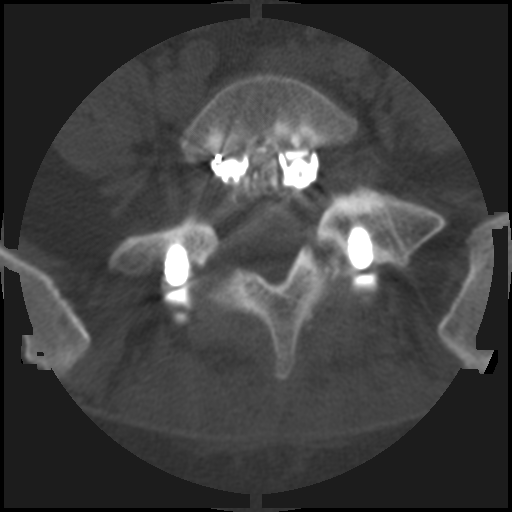
[im 50/100  bone]
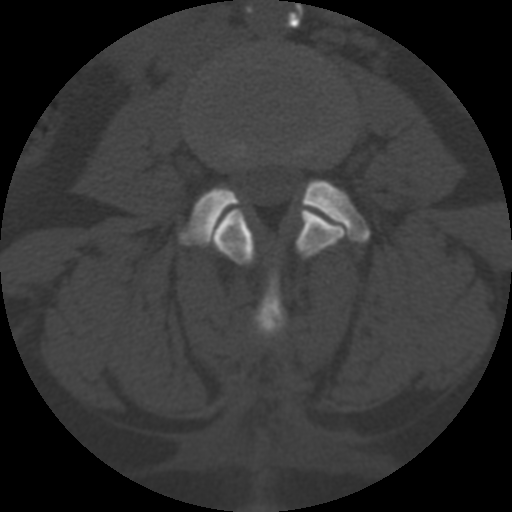
[im 75/100  bone]
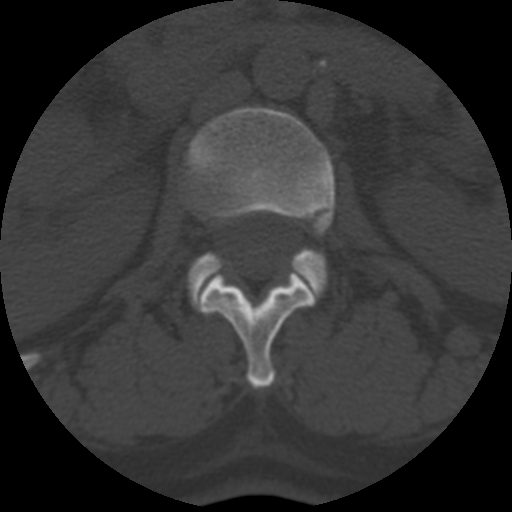

[Series 201: coronal/lower · coronal · 0.31mm/px · 1 of 45 slices shown]
[im 23/45  bone]
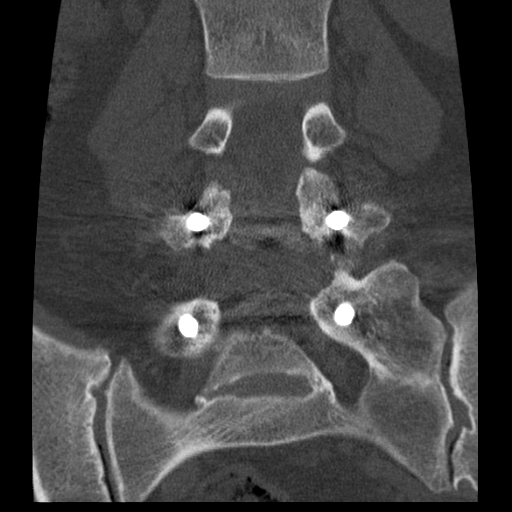

[Series 202: sagittal · sagittal · 0.50mm/px · 5 of 51 slices shown, 6 images]
[im 17/51  bone]
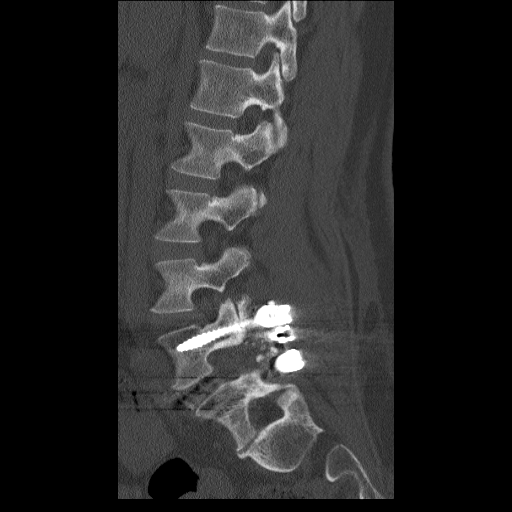
[im 21/51  bone]
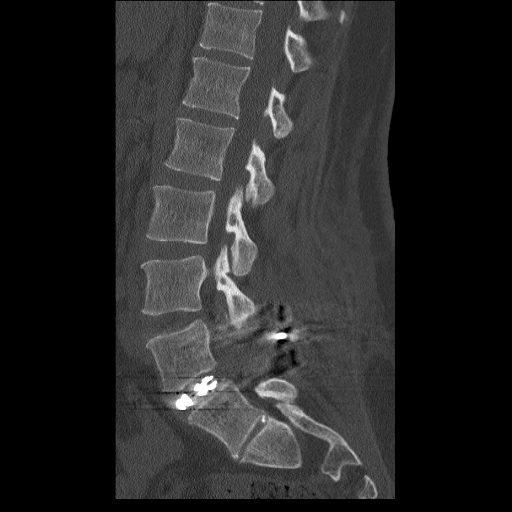
[im 26/51  soft-tissue]
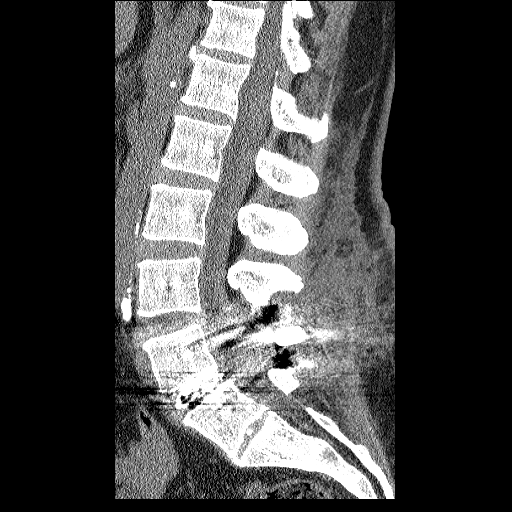
[im 26/51  bone]
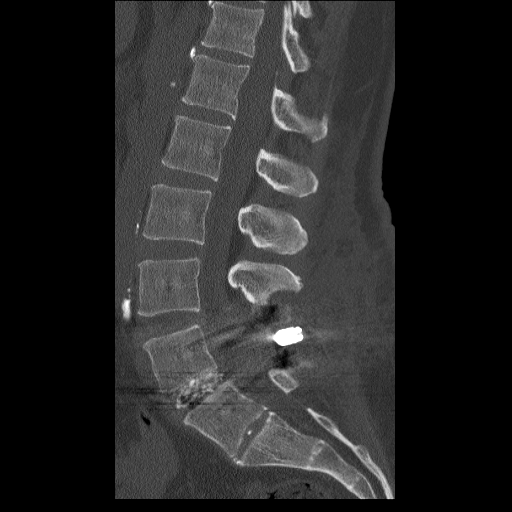
[im 30/51  bone]
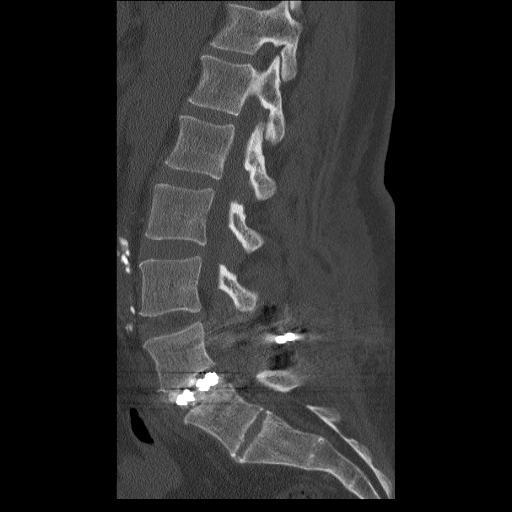
[im 34/51  bone]
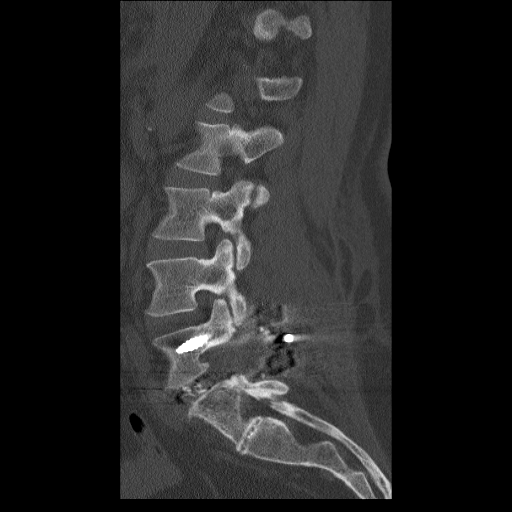

[11 of 33 positions shown; findings below may reference images not displayed]

FINDINGS: The patient is status post L5-S1 PLIF.  The lumbar spine
is imaged from T12-S3.  The disc levels at L3-4 above are normal.

L4-5:  A mild broad-based disc bulge is present.  The foramina are
patent bilaterally.  The patient is status post partial
laminectomy.

L5-S1:  The patient is status post discectomy.  Fusion cages are in
place.  Posterior pedicle screw and rod fixation is present
bilaterally.  The hardware is intact.  S1 screws extend anteriorly
beyond the cortex bilaterally, more promptly on the left.  There is
no associated complication.  The screws appear separate from the
lumbosacral plexus.

There does appear be some incorporation of graft material into both
the inferior endplate of L5 and the superior endplate of S1.  There
is no gas to suggest motion.  Bilateral foraminotomies have been
performed.  A wide laminectomy was performed.  Soft tissue is
present within the spinal canal.
IMPRESSION: 1.  Status post decompressive laminectomy and discectomy at L5-S1.
2.  There is some incorporation of graft material into both the
inferior endplate of L5 and superior endplate of S1, suggesting
developing fusion.
3.  Status post bilateral foraminotomies at L5-S1 without evidence
for residual osseous stenosis.
4.  Disc bulging at L4-5 without significant stenosis.
5.  The S1 pedicle screws extend beyond the cortex anteriorly on
both sides without evidence for nerve root or vascular compromise.

## 2013-01-27 DIAGNOSIS — M5126 Other intervertebral disc displacement, lumbar region: Secondary | ICD-10-CM

## 2013-01-27 HISTORY — DX: Other intervertebral disc displacement, lumbar region: M51.26

## 2013-05-06 DIAGNOSIS — G250 Essential tremor: Secondary | ICD-10-CM | POA: Insufficient documentation

## 2013-05-06 DIAGNOSIS — F329 Major depressive disorder, single episode, unspecified: Secondary | ICD-10-CM | POA: Insufficient documentation

## 2013-05-06 DIAGNOSIS — F32A Depression, unspecified: Secondary | ICD-10-CM

## 2013-05-06 HISTORY — DX: Depression, unspecified: F32.A

## 2013-05-06 HISTORY — DX: Major depressive disorder, single episode, unspecified: F32.9

## 2013-05-06 HISTORY — DX: Essential tremor: G25.0

## 2013-09-24 DIAGNOSIS — R634 Abnormal weight loss: Secondary | ICD-10-CM | POA: Diagnosis not present

## 2013-10-22 DIAGNOSIS — F411 Generalized anxiety disorder: Secondary | ICD-10-CM | POA: Diagnosis not present

## 2013-10-22 DIAGNOSIS — R634 Abnormal weight loss: Secondary | ICD-10-CM | POA: Diagnosis not present

## 2013-10-22 DIAGNOSIS — I10 Essential (primary) hypertension: Secondary | ICD-10-CM | POA: Diagnosis not present

## 2013-12-10 DIAGNOSIS — N393 Stress incontinence (female) (male): Secondary | ICD-10-CM

## 2013-12-10 DIAGNOSIS — R928 Other abnormal and inconclusive findings on diagnostic imaging of breast: Secondary | ICD-10-CM | POA: Insufficient documentation

## 2013-12-10 DIAGNOSIS — M47817 Spondylosis without myelopathy or radiculopathy, lumbosacral region: Secondary | ICD-10-CM

## 2013-12-10 DIAGNOSIS — M51379 Other intervertebral disc degeneration, lumbosacral region without mention of lumbar back pain or lower extremity pain: Secondary | ICD-10-CM

## 2013-12-10 DIAGNOSIS — I10 Essential (primary) hypertension: Secondary | ICD-10-CM

## 2013-12-10 DIAGNOSIS — F410 Panic disorder [episodic paroxysmal anxiety] without agoraphobia: Secondary | ICD-10-CM

## 2013-12-10 DIAGNOSIS — K219 Gastro-esophageal reflux disease without esophagitis: Secondary | ICD-10-CM | POA: Insufficient documentation

## 2013-12-10 DIAGNOSIS — M5412 Radiculopathy, cervical region: Secondary | ICD-10-CM

## 2013-12-10 DIAGNOSIS — G2581 Restless legs syndrome: Secondary | ICD-10-CM

## 2013-12-10 DIAGNOSIS — L0591 Pilonidal cyst without abscess: Secondary | ICD-10-CM | POA: Insufficient documentation

## 2013-12-10 DIAGNOSIS — K921 Melena: Secondary | ICD-10-CM

## 2013-12-10 DIAGNOSIS — M5030 Other cervical disc degeneration, unspecified cervical region: Secondary | ICD-10-CM | POA: Insufficient documentation

## 2013-12-10 DIAGNOSIS — L732 Hidradenitis suppurativa: Secondary | ICD-10-CM | POA: Insufficient documentation

## 2013-12-10 DIAGNOSIS — M545 Low back pain, unspecified: Secondary | ICD-10-CM | POA: Insufficient documentation

## 2013-12-10 DIAGNOSIS — N959 Unspecified menopausal and perimenopausal disorder: Secondary | ICD-10-CM

## 2013-12-10 DIAGNOSIS — M5137 Other intervertebral disc degeneration, lumbosacral region: Secondary | ICD-10-CM | POA: Insufficient documentation

## 2013-12-10 DIAGNOSIS — IMO0002 Reserved for concepts with insufficient information to code with codable children: Secondary | ICD-10-CM | POA: Insufficient documentation

## 2013-12-10 DIAGNOSIS — F17200 Nicotine dependence, unspecified, uncomplicated: Secondary | ICD-10-CM | POA: Insufficient documentation

## 2013-12-10 HISTORY — DX: Pilonidal cyst without abscess: L05.91

## 2013-12-10 HISTORY — DX: Essential (primary) hypertension: I10

## 2013-12-10 HISTORY — DX: Radiculopathy, cervical region: M54.12

## 2013-12-10 HISTORY — DX: Melena: K92.1

## 2013-12-10 HISTORY — DX: Panic disorder (episodic paroxysmal anxiety): F41.0

## 2013-12-10 HISTORY — DX: Stress incontinence (female) (male): N39.3

## 2013-12-10 HISTORY — DX: Gastro-esophageal reflux disease without esophagitis: K21.9

## 2013-12-10 HISTORY — DX: Restless legs syndrome: G25.81

## 2013-12-10 HISTORY — DX: Unspecified menopausal and perimenopausal disorder: N95.9

## 2013-12-10 HISTORY — DX: Low back pain, unspecified: M54.50

## 2013-12-10 HISTORY — DX: Other cervical disc degeneration, unspecified cervical region: M50.30

## 2013-12-10 HISTORY — DX: Spondylosis without myelopathy or radiculopathy, lumbosacral region: M47.817

## 2013-12-10 HISTORY — DX: Other intervertebral disc degeneration, lumbosacral region without mention of lumbar back pain or lower extremity pain: M51.379

## 2013-12-10 HISTORY — DX: Hidradenitis suppurativa: L73.2

## 2013-12-10 HISTORY — DX: Other abnormal and inconclusive findings on diagnostic imaging of breast: R92.8

## 2014-01-11 DIAGNOSIS — M79644 Pain in right finger(s): Secondary | ICD-10-CM | POA: Diagnosis not present

## 2014-01-11 DIAGNOSIS — M1811 Unilateral primary osteoarthritis of first carpometacarpal joint, right hand: Secondary | ICD-10-CM | POA: Diagnosis not present

## 2014-01-28 DIAGNOSIS — I10 Essential (primary) hypertension: Secondary | ICD-10-CM | POA: Diagnosis not present

## 2014-01-28 DIAGNOSIS — D485 Neoplasm of uncertain behavior of skin: Secondary | ICD-10-CM | POA: Diagnosis not present

## 2014-01-28 DIAGNOSIS — F413 Other mixed anxiety disorders: Secondary | ICD-10-CM | POA: Diagnosis not present

## 2014-01-28 DIAGNOSIS — Z23 Encounter for immunization: Secondary | ICD-10-CM | POA: Diagnosis not present

## 2014-01-28 DIAGNOSIS — E782 Mixed hyperlipidemia: Secondary | ICD-10-CM | POA: Diagnosis not present

## 2014-02-16 DIAGNOSIS — B079 Viral wart, unspecified: Secondary | ICD-10-CM | POA: Diagnosis not present

## 2014-02-16 DIAGNOSIS — L570 Actinic keratosis: Secondary | ICD-10-CM | POA: Diagnosis not present

## 2014-02-16 DIAGNOSIS — D485 Neoplasm of uncertain behavior of skin: Secondary | ICD-10-CM | POA: Diagnosis not present

## 2014-02-16 DIAGNOSIS — D0461 Carcinoma in situ of skin of right upper limb, including shoulder: Secondary | ICD-10-CM | POA: Diagnosis not present

## 2014-02-16 DIAGNOSIS — L720 Epidermal cyst: Secondary | ICD-10-CM | POA: Diagnosis not present

## 2014-02-26 DIAGNOSIS — C44622 Squamous cell carcinoma of skin of right upper limb, including shoulder: Secondary | ICD-10-CM | POA: Diagnosis not present

## 2014-06-11 DIAGNOSIS — L814 Other melanin hyperpigmentation: Secondary | ICD-10-CM | POA: Diagnosis not present

## 2014-06-11 DIAGNOSIS — L570 Actinic keratosis: Secondary | ICD-10-CM | POA: Diagnosis not present

## 2014-06-11 DIAGNOSIS — Z85828 Personal history of other malignant neoplasm of skin: Secondary | ICD-10-CM | POA: Diagnosis not present

## 2014-06-11 DIAGNOSIS — Z08 Encounter for follow-up examination after completed treatment for malignant neoplasm: Secondary | ICD-10-CM | POA: Diagnosis not present

## 2014-07-09 DIAGNOSIS — M5412 Radiculopathy, cervical region: Secondary | ICD-10-CM | POA: Diagnosis not present

## 2014-07-09 DIAGNOSIS — M1990 Unspecified osteoarthritis, unspecified site: Secondary | ICD-10-CM | POA: Diagnosis not present

## 2014-07-09 DIAGNOSIS — M5030 Other cervical disc degeneration, unspecified cervical region: Secondary | ICD-10-CM | POA: Diagnosis not present

## 2014-07-09 DIAGNOSIS — M47817 Spondylosis without myelopathy or radiculopathy, lumbosacral region: Secondary | ICD-10-CM | POA: Diagnosis not present

## 2014-07-09 DIAGNOSIS — M19049 Primary osteoarthritis, unspecified hand: Secondary | ICD-10-CM | POA: Insufficient documentation

## 2014-07-09 HISTORY — DX: Primary osteoarthritis, unspecified hand: M19.049

## 2014-08-16 DIAGNOSIS — R5383 Other fatigue: Secondary | ICD-10-CM | POA: Diagnosis not present

## 2014-08-16 DIAGNOSIS — I10 Essential (primary) hypertension: Secondary | ICD-10-CM | POA: Diagnosis not present

## 2014-08-16 DIAGNOSIS — F413 Other mixed anxiety disorders: Secondary | ICD-10-CM | POA: Diagnosis not present

## 2014-11-22 DIAGNOSIS — Z800 Family history of malignant neoplasm of digestive organs: Secondary | ICD-10-CM | POA: Diagnosis not present

## 2014-11-29 DIAGNOSIS — F413 Other mixed anxiety disorders: Secondary | ICD-10-CM | POA: Diagnosis not present

## 2014-11-29 DIAGNOSIS — I10 Essential (primary) hypertension: Secondary | ICD-10-CM | POA: Diagnosis not present

## 2014-11-29 DIAGNOSIS — R5383 Other fatigue: Secondary | ICD-10-CM | POA: Diagnosis not present

## 2014-11-29 DIAGNOSIS — M545 Low back pain: Secondary | ICD-10-CM | POA: Diagnosis not present

## 2014-11-29 DIAGNOSIS — E782 Mixed hyperlipidemia: Secondary | ICD-10-CM | POA: Diagnosis not present

## 2014-12-01 DIAGNOSIS — M545 Low back pain: Secondary | ICD-10-CM | POA: Diagnosis not present

## 2014-12-01 DIAGNOSIS — Z981 Arthrodesis status: Secondary | ICD-10-CM | POA: Diagnosis not present

## 2014-12-01 DIAGNOSIS — M533 Sacrococcygeal disorders, not elsewhere classified: Secondary | ICD-10-CM | POA: Diagnosis not present

## 2014-12-15 DIAGNOSIS — Z800 Family history of malignant neoplasm of digestive organs: Secondary | ICD-10-CM | POA: Diagnosis not present

## 2014-12-15 DIAGNOSIS — K640 First degree hemorrhoids: Secondary | ICD-10-CM | POA: Diagnosis not present

## 2014-12-15 DIAGNOSIS — Z1211 Encounter for screening for malignant neoplasm of colon: Secondary | ICD-10-CM | POA: Diagnosis not present

## 2015-02-01 DIAGNOSIS — M5030 Other cervical disc degeneration, unspecified cervical region: Secondary | ICD-10-CM | POA: Diagnosis not present

## 2015-02-01 DIAGNOSIS — M5137 Other intervertebral disc degeneration, lumbosacral region: Secondary | ICD-10-CM | POA: Diagnosis not present

## 2015-02-01 DIAGNOSIS — Z6826 Body mass index (BMI) 26.0-26.9, adult: Secondary | ICD-10-CM | POA: Diagnosis not present

## 2015-02-01 DIAGNOSIS — M5412 Radiculopathy, cervical region: Secondary | ICD-10-CM | POA: Diagnosis not present

## 2015-02-01 DIAGNOSIS — M47817 Spondylosis without myelopathy or radiculopathy, lumbosacral region: Secondary | ICD-10-CM | POA: Diagnosis not present

## 2015-05-30 DIAGNOSIS — I10 Essential (primary) hypertension: Secondary | ICD-10-CM | POA: Diagnosis not present

## 2015-05-30 DIAGNOSIS — R5383 Other fatigue: Secondary | ICD-10-CM | POA: Diagnosis not present

## 2015-05-30 DIAGNOSIS — E782 Mixed hyperlipidemia: Secondary | ICD-10-CM | POA: Diagnosis not present

## 2015-06-09 DIAGNOSIS — D225 Melanocytic nevi of trunk: Secondary | ICD-10-CM | POA: Diagnosis not present

## 2015-06-09 DIAGNOSIS — L720 Epidermal cyst: Secondary | ICD-10-CM | POA: Diagnosis not present

## 2015-06-24 DIAGNOSIS — I10 Essential (primary) hypertension: Secondary | ICD-10-CM | POA: Diagnosis not present

## 2015-06-24 DIAGNOSIS — M5030 Other cervical disc degeneration, unspecified cervical region: Secondary | ICD-10-CM | POA: Diagnosis not present

## 2015-06-24 DIAGNOSIS — M47817 Spondylosis without myelopathy or radiculopathy, lumbosacral region: Secondary | ICD-10-CM | POA: Diagnosis not present

## 2015-06-24 DIAGNOSIS — Z6826 Body mass index (BMI) 26.0-26.9, adult: Secondary | ICD-10-CM | POA: Diagnosis not present

## 2015-06-24 DIAGNOSIS — M5412 Radiculopathy, cervical region: Secondary | ICD-10-CM | POA: Diagnosis not present

## 2015-06-24 DIAGNOSIS — M5137 Other intervertebral disc degeneration, lumbosacral region: Secondary | ICD-10-CM | POA: Diagnosis not present

## 2015-09-28 DIAGNOSIS — I10 Essential (primary) hypertension: Secondary | ICD-10-CM | POA: Diagnosis not present

## 2015-09-28 DIAGNOSIS — E782 Mixed hyperlipidemia: Secondary | ICD-10-CM | POA: Diagnosis not present

## 2015-09-28 DIAGNOSIS — F413 Other mixed anxiety disorders: Secondary | ICD-10-CM | POA: Diagnosis not present

## 2015-10-14 DIAGNOSIS — L578 Other skin changes due to chronic exposure to nonionizing radiation: Secondary | ICD-10-CM | POA: Diagnosis not present

## 2015-10-14 DIAGNOSIS — L720 Epidermal cyst: Secondary | ICD-10-CM | POA: Diagnosis not present

## 2015-10-14 DIAGNOSIS — L821 Other seborrheic keratosis: Secondary | ICD-10-CM | POA: Diagnosis not present

## 2015-10-14 DIAGNOSIS — L570 Actinic keratosis: Secondary | ICD-10-CM | POA: Diagnosis not present

## 2015-11-02 DIAGNOSIS — L720 Epidermal cyst: Secondary | ICD-10-CM | POA: Diagnosis not present

## 2015-12-06 DIAGNOSIS — M1990 Unspecified osteoarthritis, unspecified site: Secondary | ICD-10-CM | POA: Diagnosis not present

## 2015-12-06 DIAGNOSIS — M2550 Pain in unspecified joint: Secondary | ICD-10-CM | POA: Diagnosis not present

## 2015-12-06 DIAGNOSIS — Z6826 Body mass index (BMI) 26.0-26.9, adult: Secondary | ICD-10-CM | POA: Diagnosis not present

## 2015-12-06 DIAGNOSIS — M5412 Radiculopathy, cervical region: Secondary | ICD-10-CM | POA: Diagnosis not present

## 2015-12-06 DIAGNOSIS — M5137 Other intervertebral disc degeneration, lumbosacral region: Secondary | ICD-10-CM | POA: Diagnosis not present

## 2015-12-06 DIAGNOSIS — M5030 Other cervical disc degeneration, unspecified cervical region: Secondary | ICD-10-CM | POA: Diagnosis not present

## 2015-12-06 DIAGNOSIS — M47817 Spondylosis without myelopathy or radiculopathy, lumbosacral region: Secondary | ICD-10-CM | POA: Diagnosis not present

## 2015-12-06 HISTORY — DX: Pain in unspecified joint: M25.50

## 2016-04-13 DIAGNOSIS — R5383 Other fatigue: Secondary | ICD-10-CM | POA: Diagnosis not present

## 2016-04-13 DIAGNOSIS — I10 Essential (primary) hypertension: Secondary | ICD-10-CM | POA: Diagnosis not present

## 2016-04-18 DIAGNOSIS — M5137 Other intervertebral disc degeneration, lumbosacral region: Secondary | ICD-10-CM | POA: Diagnosis not present

## 2016-04-18 DIAGNOSIS — Z6820 Body mass index (BMI) 20.0-20.9, adult: Secondary | ICD-10-CM | POA: Diagnosis not present

## 2016-04-18 DIAGNOSIS — M47817 Spondylosis without myelopathy or radiculopathy, lumbosacral region: Secondary | ICD-10-CM | POA: Diagnosis not present

## 2016-04-18 DIAGNOSIS — M5030 Other cervical disc degeneration, unspecified cervical region: Secondary | ICD-10-CM | POA: Diagnosis not present

## 2016-04-18 DIAGNOSIS — M5412 Radiculopathy, cervical region: Secondary | ICD-10-CM | POA: Diagnosis not present

## 2016-05-10 DIAGNOSIS — E782 Mixed hyperlipidemia: Secondary | ICD-10-CM | POA: Diagnosis not present

## 2016-05-10 DIAGNOSIS — I10 Essential (primary) hypertension: Secondary | ICD-10-CM | POA: Diagnosis not present

## 2016-05-10 DIAGNOSIS — F411 Generalized anxiety disorder: Secondary | ICD-10-CM | POA: Diagnosis not present

## 2016-05-14 DIAGNOSIS — M5137 Other intervertebral disc degeneration, lumbosacral region: Secondary | ICD-10-CM | POA: Diagnosis not present

## 2016-05-14 DIAGNOSIS — G894 Chronic pain syndrome: Secondary | ICD-10-CM | POA: Diagnosis not present

## 2016-05-14 DIAGNOSIS — M47817 Spondylosis without myelopathy or radiculopathy, lumbosacral region: Secondary | ICD-10-CM | POA: Diagnosis not present

## 2016-05-14 DIAGNOSIS — M5030 Other cervical disc degeneration, unspecified cervical region: Secondary | ICD-10-CM | POA: Diagnosis not present

## 2016-05-14 DIAGNOSIS — M5412 Radiculopathy, cervical region: Secondary | ICD-10-CM | POA: Diagnosis not present

## 2016-05-14 DIAGNOSIS — Z6822 Body mass index (BMI) 22.0-22.9, adult: Secondary | ICD-10-CM | POA: Diagnosis not present

## 2016-09-05 DIAGNOSIS — G894 Chronic pain syndrome: Secondary | ICD-10-CM | POA: Diagnosis not present

## 2016-09-05 DIAGNOSIS — M5412 Radiculopathy, cervical region: Secondary | ICD-10-CM | POA: Diagnosis not present

## 2016-09-05 DIAGNOSIS — Z6822 Body mass index (BMI) 22.0-22.9, adult: Secondary | ICD-10-CM | POA: Diagnosis not present

## 2016-09-05 DIAGNOSIS — L570 Actinic keratosis: Secondary | ICD-10-CM | POA: Diagnosis not present

## 2016-09-05 DIAGNOSIS — M47817 Spondylosis without myelopathy or radiculopathy, lumbosacral region: Secondary | ICD-10-CM | POA: Diagnosis not present

## 2016-09-05 DIAGNOSIS — L728 Other follicular cysts of the skin and subcutaneous tissue: Secondary | ICD-10-CM | POA: Diagnosis not present

## 2016-09-05 DIAGNOSIS — M5030 Other cervical disc degeneration, unspecified cervical region: Secondary | ICD-10-CM | POA: Diagnosis not present

## 2016-12-26 DIAGNOSIS — I10 Essential (primary) hypertension: Secondary | ICD-10-CM | POA: Diagnosis not present

## 2016-12-26 DIAGNOSIS — F322 Major depressive disorder, single episode, severe without psychotic features: Secondary | ICD-10-CM | POA: Diagnosis not present

## 2016-12-26 DIAGNOSIS — G894 Chronic pain syndrome: Secondary | ICD-10-CM | POA: Diagnosis not present

## 2016-12-26 DIAGNOSIS — Z79891 Long term (current) use of opiate analgesic: Secondary | ICD-10-CM | POA: Diagnosis not present

## 2016-12-26 DIAGNOSIS — F411 Generalized anxiety disorder: Secondary | ICD-10-CM | POA: Diagnosis not present

## 2016-12-26 DIAGNOSIS — E782 Mixed hyperlipidemia: Secondary | ICD-10-CM | POA: Diagnosis not present

## 2016-12-26 DIAGNOSIS — Z23 Encounter for immunization: Secondary | ICD-10-CM | POA: Diagnosis not present

## 2017-01-04 DIAGNOSIS — M5030 Other cervical disc degeneration, unspecified cervical region: Secondary | ICD-10-CM | POA: Diagnosis not present

## 2017-01-04 DIAGNOSIS — M5137 Other intervertebral disc degeneration, lumbosacral region: Secondary | ICD-10-CM | POA: Diagnosis not present

## 2017-01-04 DIAGNOSIS — M5412 Radiculopathy, cervical region: Secondary | ICD-10-CM | POA: Diagnosis not present

## 2017-01-04 DIAGNOSIS — M47817 Spondylosis without myelopathy or radiculopathy, lumbosacral region: Secondary | ICD-10-CM | POA: Diagnosis not present

## 2017-01-30 DIAGNOSIS — I10 Essential (primary) hypertension: Secondary | ICD-10-CM | POA: Diagnosis not present

## 2017-01-30 DIAGNOSIS — F411 Generalized anxiety disorder: Secondary | ICD-10-CM | POA: Diagnosis not present

## 2017-04-17 DIAGNOSIS — L723 Sebaceous cyst: Secondary | ICD-10-CM | POA: Diagnosis not present

## 2017-04-17 DIAGNOSIS — I10 Essential (primary) hypertension: Secondary | ICD-10-CM | POA: Diagnosis not present

## 2017-05-02 DIAGNOSIS — D0461 Carcinoma in situ of skin of right upper limb, including shoulder: Secondary | ICD-10-CM | POA: Diagnosis not present

## 2017-05-02 DIAGNOSIS — L720 Epidermal cyst: Secondary | ICD-10-CM | POA: Diagnosis not present

## 2017-05-16 DIAGNOSIS — M19042 Primary osteoarthritis, left hand: Secondary | ICD-10-CM | POA: Diagnosis not present

## 2017-05-16 DIAGNOSIS — M79642 Pain in left hand: Secondary | ICD-10-CM | POA: Diagnosis not present

## 2017-05-16 DIAGNOSIS — M79641 Pain in right hand: Secondary | ICD-10-CM | POA: Diagnosis not present

## 2017-05-16 DIAGNOSIS — R590 Localized enlarged lymph nodes: Secondary | ICD-10-CM | POA: Diagnosis not present

## 2017-05-16 DIAGNOSIS — R768 Other specified abnormal immunological findings in serum: Secondary | ICD-10-CM | POA: Diagnosis not present

## 2017-05-16 DIAGNOSIS — M19041 Primary osteoarthritis, right hand: Secondary | ICD-10-CM | POA: Diagnosis not present

## 2017-05-16 DIAGNOSIS — M5030 Other cervical disc degeneration, unspecified cervical region: Secondary | ICD-10-CM | POA: Diagnosis not present

## 2017-05-16 DIAGNOSIS — M5137 Other intervertebral disc degeneration, lumbosacral region: Secondary | ICD-10-CM | POA: Diagnosis not present

## 2017-07-24 DIAGNOSIS — I10 Essential (primary) hypertension: Secondary | ICD-10-CM | POA: Diagnosis not present

## 2017-07-24 DIAGNOSIS — R5383 Other fatigue: Secondary | ICD-10-CM | POA: Diagnosis not present

## 2017-07-24 DIAGNOSIS — E782 Mixed hyperlipidemia: Secondary | ICD-10-CM | POA: Diagnosis not present

## 2017-08-30 DIAGNOSIS — L720 Epidermal cyst: Secondary | ICD-10-CM | POA: Diagnosis not present

## 2017-08-30 DIAGNOSIS — L578 Other skin changes due to chronic exposure to nonionizing radiation: Secondary | ICD-10-CM | POA: Diagnosis not present

## 2017-08-30 DIAGNOSIS — L570 Actinic keratosis: Secondary | ICD-10-CM | POA: Diagnosis not present

## 2017-08-30 DIAGNOSIS — L821 Other seborrheic keratosis: Secondary | ICD-10-CM | POA: Diagnosis not present

## 2017-10-24 DIAGNOSIS — R5383 Other fatigue: Secondary | ICD-10-CM | POA: Diagnosis not present

## 2017-10-24 DIAGNOSIS — E782 Mixed hyperlipidemia: Secondary | ICD-10-CM | POA: Diagnosis not present

## 2017-10-24 DIAGNOSIS — I10 Essential (primary) hypertension: Secondary | ICD-10-CM | POA: Diagnosis not present

## 2017-10-24 DIAGNOSIS — F411 Generalized anxiety disorder: Secondary | ICD-10-CM | POA: Diagnosis not present

## 2017-12-03 DIAGNOSIS — M4804 Spinal stenosis, thoracic region: Secondary | ICD-10-CM | POA: Diagnosis not present

## 2017-12-03 DIAGNOSIS — M5137 Other intervertebral disc degeneration, lumbosacral region: Secondary | ICD-10-CM | POA: Diagnosis not present

## 2017-12-03 DIAGNOSIS — M5030 Other cervical disc degeneration, unspecified cervical region: Secondary | ICD-10-CM | POA: Diagnosis not present

## 2017-12-03 DIAGNOSIS — M4312 Spondylolisthesis, cervical region: Secondary | ICD-10-CM | POA: Diagnosis not present

## 2017-12-03 DIAGNOSIS — M4722 Other spondylosis with radiculopathy, cervical region: Secondary | ICD-10-CM | POA: Diagnosis not present

## 2017-12-03 DIAGNOSIS — M5412 Radiculopathy, cervical region: Secondary | ICD-10-CM | POA: Diagnosis not present

## 2017-12-03 DIAGNOSIS — M4724 Other spondylosis with radiculopathy, thoracic region: Secondary | ICD-10-CM | POA: Diagnosis not present

## 2017-12-03 DIAGNOSIS — M4184 Other forms of scoliosis, thoracic region: Secondary | ICD-10-CM | POA: Diagnosis not present

## 2017-12-03 DIAGNOSIS — M4322 Fusion of spine, cervical region: Secondary | ICD-10-CM | POA: Diagnosis not present

## 2017-12-03 DIAGNOSIS — M47812 Spondylosis without myelopathy or radiculopathy, cervical region: Secondary | ICD-10-CM

## 2017-12-03 DIAGNOSIS — M47817 Spondylosis without myelopathy or radiculopathy, lumbosacral region: Secondary | ICD-10-CM | POA: Diagnosis not present

## 2017-12-03 DIAGNOSIS — M40204 Unspecified kyphosis, thoracic region: Secondary | ICD-10-CM | POA: Diagnosis not present

## 2017-12-03 DIAGNOSIS — M546 Pain in thoracic spine: Secondary | ICD-10-CM | POA: Diagnosis not present

## 2017-12-03 DIAGNOSIS — Z981 Arthrodesis status: Secondary | ICD-10-CM | POA: Diagnosis not present

## 2017-12-03 HISTORY — DX: Spondylosis without myelopathy or radiculopathy, cervical region: M47.812

## 2018-01-23 DIAGNOSIS — Z23 Encounter for immunization: Secondary | ICD-10-CM | POA: Diagnosis not present

## 2018-01-23 DIAGNOSIS — E782 Mixed hyperlipidemia: Secondary | ICD-10-CM | POA: Diagnosis not present

## 2018-01-23 DIAGNOSIS — I10 Essential (primary) hypertension: Secondary | ICD-10-CM | POA: Diagnosis not present

## 2018-01-23 DIAGNOSIS — F411 Generalized anxiety disorder: Secondary | ICD-10-CM | POA: Diagnosis not present

## 2018-01-23 DIAGNOSIS — J060 Acute laryngopharyngitis: Secondary | ICD-10-CM | POA: Diagnosis not present

## 2018-05-28 DIAGNOSIS — E782 Mixed hyperlipidemia: Secondary | ICD-10-CM | POA: Diagnosis not present

## 2018-05-28 DIAGNOSIS — I10 Essential (primary) hypertension: Secondary | ICD-10-CM | POA: Diagnosis not present

## 2018-08-12 DIAGNOSIS — C44622 Squamous cell carcinoma of skin of right upper limb, including shoulder: Secondary | ICD-10-CM | POA: Diagnosis not present

## 2018-08-12 DIAGNOSIS — L720 Epidermal cyst: Secondary | ICD-10-CM | POA: Diagnosis not present

## 2018-08-12 DIAGNOSIS — C44729 Squamous cell carcinoma of skin of left lower limb, including hip: Secondary | ICD-10-CM | POA: Diagnosis not present

## 2018-08-12 DIAGNOSIS — L02213 Cutaneous abscess of chest wall: Secondary | ICD-10-CM | POA: Diagnosis not present

## 2018-08-12 DIAGNOSIS — L578 Other skin changes due to chronic exposure to nonionizing radiation: Secondary | ICD-10-CM | POA: Diagnosis not present

## 2018-08-12 DIAGNOSIS — L570 Actinic keratosis: Secondary | ICD-10-CM | POA: Diagnosis not present

## 2018-09-22 DIAGNOSIS — F411 Generalized anxiety disorder: Secondary | ICD-10-CM | POA: Diagnosis not present

## 2018-09-22 DIAGNOSIS — M150 Primary generalized (osteo)arthritis: Secondary | ICD-10-CM | POA: Diagnosis not present

## 2018-09-22 DIAGNOSIS — E782 Mixed hyperlipidemia: Secondary | ICD-10-CM | POA: Diagnosis not present

## 2018-09-22 DIAGNOSIS — I10 Essential (primary) hypertension: Secondary | ICD-10-CM | POA: Diagnosis not present

## 2018-10-21 DIAGNOSIS — B351 Tinea unguium: Secondary | ICD-10-CM | POA: Diagnosis not present

## 2018-10-21 DIAGNOSIS — B353 Tinea pedis: Secondary | ICD-10-CM | POA: Diagnosis not present

## 2018-11-24 DIAGNOSIS — B351 Tinea unguium: Secondary | ICD-10-CM | POA: Diagnosis not present

## 2019-04-08 DIAGNOSIS — J060 Acute laryngopharyngitis: Secondary | ICD-10-CM | POA: Diagnosis not present

## 2019-04-08 DIAGNOSIS — Z20828 Contact with and (suspected) exposure to other viral communicable diseases: Secondary | ICD-10-CM | POA: Diagnosis not present

## 2019-05-11 ENCOUNTER — Other Ambulatory Visit: Payer: Self-pay | Admitting: Physician Assistant

## 2019-05-29 DIAGNOSIS — C44329 Squamous cell carcinoma of skin of other parts of face: Secondary | ICD-10-CM | POA: Diagnosis not present

## 2019-05-29 DIAGNOSIS — L570 Actinic keratosis: Secondary | ICD-10-CM | POA: Diagnosis not present

## 2019-05-29 DIAGNOSIS — B351 Tinea unguium: Secondary | ICD-10-CM | POA: Diagnosis not present

## 2019-06-01 ENCOUNTER — Other Ambulatory Visit: Payer: Self-pay | Admitting: Physician Assistant

## 2019-06-10 DIAGNOSIS — R928 Other abnormal and inconclusive findings on diagnostic imaging of breast: Secondary | ICD-10-CM | POA: Diagnosis not present

## 2019-06-10 DIAGNOSIS — N6452 Nipple discharge: Secondary | ICD-10-CM | POA: Diagnosis not present

## 2019-06-10 DIAGNOSIS — R922 Inconclusive mammogram: Secondary | ICD-10-CM | POA: Diagnosis not present

## 2019-06-16 ENCOUNTER — Other Ambulatory Visit: Payer: Self-pay | Admitting: Physician Assistant

## 2019-06-29 ENCOUNTER — Other Ambulatory Visit: Payer: Self-pay | Admitting: Physician Assistant

## 2019-07-06 ENCOUNTER — Other Ambulatory Visit: Payer: Self-pay | Admitting: Physician Assistant

## 2019-07-15 ENCOUNTER — Other Ambulatory Visit: Payer: Self-pay | Admitting: Physician Assistant

## 2019-07-15 ENCOUNTER — Other Ambulatory Visit: Payer: Self-pay | Admitting: Family Medicine

## 2019-08-05 ENCOUNTER — Other Ambulatory Visit: Payer: Self-pay | Admitting: Physician Assistant

## 2019-08-31 ENCOUNTER — Other Ambulatory Visit: Payer: Self-pay | Admitting: Physician Assistant

## 2019-09-29 ENCOUNTER — Other Ambulatory Visit: Payer: Self-pay | Admitting: Physician Assistant

## 2019-10-27 ENCOUNTER — Other Ambulatory Visit: Payer: Self-pay | Admitting: Physician Assistant

## 2019-11-01 ENCOUNTER — Encounter: Payer: Self-pay | Admitting: Physician Assistant

## 2019-11-01 DIAGNOSIS — I10 Essential (primary) hypertension: Secondary | ICD-10-CM | POA: Insufficient documentation

## 2019-11-01 DIAGNOSIS — F411 Generalized anxiety disorder: Secondary | ICD-10-CM | POA: Insufficient documentation

## 2019-11-01 DIAGNOSIS — E782 Mixed hyperlipidemia: Secondary | ICD-10-CM | POA: Insufficient documentation

## 2019-11-02 ENCOUNTER — Ambulatory Visit: Payer: Self-pay | Admitting: Physician Assistant

## 2019-11-11 ENCOUNTER — Ambulatory Visit (INDEPENDENT_AMBULATORY_CARE_PROVIDER_SITE_OTHER): Payer: Medicare Other | Admitting: Physician Assistant

## 2019-11-11 ENCOUNTER — Encounter: Payer: Self-pay | Admitting: Physician Assistant

## 2019-11-11 ENCOUNTER — Other Ambulatory Visit: Payer: Self-pay

## 2019-11-11 VITALS — BP 122/72 | HR 72 | Temp 97.9°F | Ht 61.0 in | Wt 147.0 lb

## 2019-11-11 DIAGNOSIS — F411 Generalized anxiety disorder: Secondary | ICD-10-CM

## 2019-11-11 DIAGNOSIS — E782 Mixed hyperlipidemia: Secondary | ICD-10-CM | POA: Diagnosis not present

## 2019-11-11 DIAGNOSIS — R5383 Other fatigue: Secondary | ICD-10-CM

## 2019-11-11 DIAGNOSIS — M5030 Other cervical disc degeneration, unspecified cervical region: Secondary | ICD-10-CM

## 2019-11-11 DIAGNOSIS — I10 Essential (primary) hypertension: Secondary | ICD-10-CM | POA: Diagnosis not present

## 2019-11-11 MED ORDER — ETODOLAC 400 MG PO TABS: 400.0000 mg | ORAL_TABLET | Freq: Two times a day (BID) | ORAL | 1 refills | Status: DC

## 2019-11-11 MED ORDER — BUPROPION HCL ER (SR) 100 MG PO TB12: 100.0000 mg | ORAL_TABLET | Freq: Two times a day (BID) | ORAL | 2 refills | Status: DC

## 2019-11-11 NOTE — Assessment & Plan Note (Signed)
Continue current meds as directed Will try wellbutrin as directed

## 2019-11-11 NOTE — Assessment & Plan Note (Signed)
To restart lodine as directed

## 2019-11-11 NOTE — Progress Notes (Signed)
Established Patient Office Visit  Subjective:  Patient ID: Kaitlyn Diaz, female    DOB: 1965/05/21  Age: 54 y.o. MRN: [id number]  CC:  Chief Complaint  Patient presents with  . Hypertension    Chronic follow up    HPI [name] presents for hypertension     Pt presents for follow up of hypertension.  She is tolerating the medication well without side effects.  Compliance with treatment has been good; she takes her medication as directed, maintains her diet and exercise regimen, and follows up as directed.  She is taking lisinopril 20mg  qd    Follow up of generalized anxiety disorder.  pt is doing well on her xanax, cymbalta and buspar - is having some breakthrough anxiety symptoms and would like to try wellbutrin that she has heard about from friends    Pt presents with hyperlipidemia.  Compliance with treatment has been good; she maintains her low cholesterol diet, follows up as directed, and maintains her exercise regimen.  She denies experiencing any hypercholesterolemia related symptoms.  She is currently taking fenofibrate    Dx with primary generalized (osteo)arthritis; this has been diagnosed only as arthritis with no further determination of type at this time.  This was diagnosed more than 5 years ago.  The discomfort is mildly uncomfortable.  Primary joints affected include hands and fingers.  Pt has been out of lodine and requests refill of med  Past Medical History:  Diagnosis Date  . Anemia   . Anxiety    "panic attacks, claustophobic on xanax"  . Arthritis   . Asthma   . Complication of anesthesia    Difficult to put to sleep, Has run fever, "runs in famly"  . COPD (chronic obstructive pulmonary disease) ([place])   . Enlarged heart    hx of  . Essential (primary) hypertension   . Family history of anesthesia complication    malignant hyperthermia on mother's side of family  . Generalized anxiety disorder   . GERD (gastroesophageal reflux disease)   . H/O  hiatal hernia    hx of  . Headache(784.0)    "due to Blood pressure"  . History of melanoma   . Hypertension    Dr. Lucita Lora, medical physician [phone]  . Malignant hyperthermia   . Mixed hyperlipidemia   . Neuromuscular disorder ([place])    "85 % nerve damage in left leg"  . Pneumonia    hx of pneumonia x 2  . PONV (postoperative nausea and vomiting)   . Spinal headache   . Urinary tract infection    and kidney infection Hx of    Past Surgical History:  Procedure Laterality Date  . ANTERIOR CERVICAL DECOMP/DISCECTOMY FUSION  12/28/2011   Procedure: ANTERIOR CERVICAL DECOMPRESSION/DISCECTOMY FUSION 2 LEVELS;  Surgeon: Elaina Hoops, MD;  Location: [place] [facility];  Service: Neurosurgery;  Laterality: N/A;  HISTORY OF MALIGNANT HYPERTHERMIA Anterior Cervical Discectomy Fusion of Cervical five-six, Cervical six-seven  . BLADDER SUSPENSION    . BREAST SURGERY     left breast mass removed  . hand mass     1994, multiple masses removed - "all benign"  . leg mass     left leg  surgically removed  . POSTERIOR FUSION LUMBAR SPINE     L4/5  . TONSILLECTOMY     and adnoids  . TUBAL LIGATION      Family History  Problem Relation Age of Onset  . Colon cancer Mother   .  Stroke Maternal Grandmother   . Stomach cancer Maternal Grandfather     Social History   Socioeconomic History  . Marital status: Married    Spouse name: Not on file  . Number of children: 2  . Years of education: Not on file  . Highest education level: Not on file  Occupational History  . Occupation: Disabled  Tobacco Use  . Smoking status: Current Every Day Smoker    Packs/day: 1.00    Years: 34.00    Pack years: 34.00    Types: Cigarettes  . Smokeless tobacco: Never Used  Vaping Use  . Vaping Use: Never used  Substance and Sexual Activity  . Alcohol use: Yes    Comment: Drinks occasionally, and consumes wine.  . Drug use: No  . Sexual activity: Never  Other Topics Concern  . Not on file   Social History Narrative  . Not on file   Social Determinants of Health   Financial Resource Strain:   . Difficulty of Paying Living Expenses:   Food Insecurity:   . Worried About Charity fundraiser in the Last Year:   . Arboriculturist in the Last Year:   Transportation Needs:   . Film/video editor (Medical):   Marland Kitchen Lack of Transportation (Non-Medical):   Physical Activity:   . Days of Exercise per Week:   . Minutes of Exercise per Session:   Stress:   . Feeling of Stress :   Social Connections:   . Frequency of Communication with Friends and Family:   . Frequency of Social Gatherings with Friends and Family:   . Attends Religious Services:   . Active Member of Clubs or Organizations:   . Attends Archivist Meetings:   Marland Kitchen Marital Status:   Intimate Partner Violence:   . Fear of Current or Ex-Partner:   . Emotionally Abused:   Marland Kitchen Physically Abused:   . Sexually Abused:      Current Outpatient Medications:  .  albuterol (PROVENTIL HFA;VENTOLIN HFA) 108 (90 BASE) MCG/ACT inhaler, Inhale 1 puff into the lungs 3 (three) times daily as needed. For shortness of breath, Disp: , Rfl:  .  ALPRAZolam (XANAX) 1 MG tablet, TAKE 1 TABLET BY MOUTH 3 TIMES DAILY AS NEEDED FOR ANXIETY, Disp: 90 tablet, Rfl: 0 .  amLODipine (NORVASC) 10 MG tablet, TAKE 1 TABLET BY MOUTH EVERY DAY, Disp: 90 tablet, Rfl: 1 .  busPIRone (BUSPAR) 15 MG tablet, TAKE 1 TABLET BY MOUTH 2 TIMES A DAY, Disp: 180 tablet, Rfl: 0 .  DULoxetine (CYMBALTA) 60 MG capsule, TAKE 1 CAPSULE BY MOUTH TWICE A DAY, Disp: 180 capsule, Rfl: 0 .  fenofibrate micronized (LOFIBRA) 134 MG capsule, TAKE 1 CAPSULE BY MOUTH DAILY, Disp: 90 capsule, Rfl: 0 .  Fluticasone-Salmeterol (ADVAIR) 250-50 MCG/DOSE AEPB, Inhale 1 puff into the lungs every 12 (twelve) hours., Disp: , Rfl:  .  gabapentin (NEURONTIN) 300 MG capsule, TAKE 3 CAPSULES BY MOUTH 2 TIMES DAILY, Disp: 180 capsule, Rfl: 2 .  lisinopril (ZESTRIL) 20 MG tablet,  TAKE 1 TABLET BY MOUTH EVERY DAY, Disp: 90 tablet, Rfl: 1 .  omeprazole (PRILOSEC) 40 MG capsule, TAKE 1 CAPSULE BY MOUTH DAILY BEFORE MEALS, Disp: 90 capsule, Rfl: 1 .  terbinafine (LAMISIL) 250 MG tablet, Take 250 mg by mouth daily., Disp: , Rfl:  .  buPROPion (WELLBUTRIN SR) 100 MG 12 hr tablet, Take 1 tablet (100 mg total) by mouth 2 (two) times daily., Disp: 60 tablet, Rfl:  2 .  etodolac (LODINE) 400 MG tablet, Take 1 tablet (400 mg total) by mouth 2 (two) times daily., Disp: 180 tablet, Rfl: 1   Allergies  Allergen Reactions  . Amoxicillin Anaphylaxis, Itching and Swelling  . Penicillins Itching and Swelling    Throat swelling Throat swelling   . Codeine Other (See Comments)    nausea  . Oxycodone Hcl Other (See Comments)    hallucinations    ROS CONSTITUTIONAL: Negative for chills, fatigue, fever, unintentional weight gain and unintentional weight loss.  E/N/T: Negative for ear pain, nasal congestion and sore throat.  CARDIOVASCULAR: Negative for chest pain, dizziness, palpitations and pedal edema.  RESPIRATORY: Negative for recent cough and dyspnea.  GASTROINTESTINAL: Negative for abdominal pain, acid reflux symptoms, constipation, diarrhea, nausea and vomiting.  MSK: see HPI INTEGUMENTARY: Negative for rash.  NEUROLOGICAL: Negative for dizziness and headaches.  PSYCHIATRIC: see HPI       Objective:    PHYSICAL EXAM:   VS: BP 122/72 (BP Location: Left Arm, Patient Position: Sitting)   Pulse 72   Temp 97.9 F (36.6 C) (Temporal)   Ht 5\' 1"  (1.549 m)   Wt 147 lb (66.7 kg)   SpO2 98%   BMI 27.78 kg/m   GEN: Well nourished, well developed, in no acute distress   Cardiac: RRR; no murmurs, rubs, or gallops,no edema - no significant varicosities Respiratory:  normal respiratory rate and pattern with no distress - normal breath sounds with no rales, rhonchi, wheezes or rubs GI: normal bowel sounds, no masses or tenderness MS: no deformity or atrophy  Skin: warm  and dry, no rash  Neuro:  Alert and Oriented x 3, Strength and sensation are intact - CN II-Xii grossly intact Psych: euthymic mood, appropriate affect and demeanor  BP 122/72 (BP Location: Left Arm, Patient Position: Sitting)   Pulse 72   Temp 97.9 F (36.6 C) (Temporal)   Ht 5\' 1"  (1.549 m)   Wt 147 lb (66.7 kg)   SpO2 98%   BMI 27.78 kg/m  Wt Readings from Last 3 Encounters:  11/11/19 147 lb (66.7 kg)  12/21/11 178 lb 9.2 oz (81 kg)  08/01/11 178 lb 9.2 oz (81 kg)     Health Maintenance Due  Topic Date Due  . Hepatitis C Screening  Never done  . HIV Screening  Never done  . TETANUS/TDAP  Never done  . PAP SMEAR-Modifier  Never done  . MAMMOGRAM  02/13/2014  . INFLUENZA VACCINE  11/08/2019    There are no preventive care reminders to display for this patient.  No results found for: TSH Lab Results  Component Value Date   WBC 6.9 12/21/2011   HGB 12.9 12/21/2011   HCT 38.7 12/21/2011   MCV 89.6 12/21/2011   PLT 269 12/21/2011   Lab Results  Component Value Date   NA 139 12/21/2011   K 4.3 12/21/2011   CO2 27 12/21/2011   GLUCOSE 95 12/21/2011   BUN 5 (L) 12/21/2011   CREATININE 0.51 12/21/2011   CALCIUM 9.4 12/21/2011   No results found for: CHOL No results found for: HDL No results found for: LDLCALC No results found for: TRIG No results found for: CHOLHDL No results found for: HGBA1C    Assessment & Plan:   Problem List Items Addressed This Visit      Cardiovascular and Mediastinum   Benign essential hypertension - Primary    Well controlled.  No changes to medicines.  Continue to work on eating  a healthy diet and exercise.  Labs drawn today.        Relevant Orders   CBC with Differential/Platelet   Comprehensive metabolic panel   TSH     Musculoskeletal and Integument   DDD (degenerative disc disease), cervical    To restart lodine as directed      Relevant Medications   etodolac (LODINE) 400 MG tablet     Other   Mixed  hyperlipidemia    Well controlled.  No changes to medicines.  Continue to work on eating a healthy diet and exercise.  Labs drawn today.        Relevant Orders   Lipid panel   Generalized anxiety disorder    Continue current meds as directed Will try wellbutrin as directed      Relevant Medications   buPROPion (WELLBUTRIN SR) 100 MG 12 hr tablet    Other Visit Diagnoses    Other fatigue       Relevant Orders   CBC with Differential/Platelet   Comprehensive metabolic panel   TSH      Meds ordered this encounter  Medications  . buPROPion (WELLBUTRIN SR) 100 MG 12 hr tablet    Sig: Take 1 tablet (100 mg total) by mouth 2 (two) times daily.    Dispense:  60 tablet    Refill:  2    Order Specific Question:   Supervising Provider    AnswerRochel Brome [id number]  . etodolac (LODINE) 400 MG tablet    Sig: Take 1 tablet (400 mg total) by mouth 2 (two) times daily.    Dispense:  180 tablet    Refill:  1    Order Specific Question:   Supervising Provider    Answer:   Shelton Silvas    Follow-up: Return in about 3 months (around 02/11/2020) for follow up visit.    SARA R Jamecia Lerman, PA-C

## 2019-11-11 NOTE — Assessment & Plan Note (Signed)
Well controlled.  ?No changes to medicines.  ?Continue to work on eating a healthy diet and exercise.  ?Labs drawn today.  ?

## 2019-11-12 ENCOUNTER — Other Ambulatory Visit: Payer: Self-pay | Admitting: Physician Assistant

## 2019-11-12 LAB — COMPREHENSIVE METABOLIC PANEL
ALT: 9 IU/L (ref 0–32)
AST: 17 IU/L (ref 0–40)
Albumin/Globulin Ratio: 2.3 — ABNORMAL HIGH (ref 1.2–2.2)
Albumin: 5.4 g/dL — ABNORMAL HIGH (ref 3.8–4.9)
Alkaline Phosphatase: 76 IU/L (ref 48–121)
BUN/Creatinine Ratio: 15 (ref 9–23)
BUN: 15 mg/dL (ref 6–24)
Bilirubin Total: <0.2 mg/dL (ref 0.0–1.2)
CO2: 20 mmol/L (ref 20–29)
Calcium: 10.1 mg/dL (ref 8.7–10.2)
Chloride: 103 mmol/L (ref 96–106)
Creatinine, Ser: 0.98 mg/dL (ref 0.57–1.00)
GFR calc Af Amer: 75 mL/min/{1.73_m2} (ref >59)
GFR calc non Af Amer: 65 mL/min/{1.73_m2} (ref >59)
Globulin, Total: 2.3 g/dL (ref 1.5–4.5)
Glucose: 98 mg/dL (ref 65–99)
Potassium: 5.5 mmol/L — ABNORMAL HIGH (ref 3.5–5.2)
Sodium: 138 mmol/L (ref 134–144)
Total Protein: 7.7 g/dL (ref 6.0–8.5)

## 2019-11-12 LAB — CBC WITH DIFFERENTIAL/PLATELET
Basophils Absolute: 0 10*3/uL (ref 0.0–0.2)
Basos: 1 %
EOS (ABSOLUTE): 0.1 10*3/uL (ref 0.0–0.4)
Eos: 1 %
Hematocrit: 38.9 % (ref 34.0–46.6)
Hemoglobin: 13 g/dL (ref 11.1–15.9)
Immature Grans (Abs): 0 10*3/uL (ref 0.0–0.1)
Immature Granulocytes: 1 %
Lymphocytes Absolute: 1 10*3/uL (ref 0.7–3.1)
Lymphs: 22 %
MCH: 31.5 pg (ref 26.6–33.0)
MCHC: 33.4 g/dL (ref 31.5–35.7)
MCV: 94 fL (ref 79–97)
Monocytes Absolute: 0.4 10*3/uL (ref 0.1–0.9)
Monocytes: 8 %
Neutrophils Absolute: 3.2 10*3/uL (ref 1.4–7.0)
Neutrophils: 67 %
Platelets: 341 10*3/uL (ref 150–450)
RBC: 4.13 x10E6/uL (ref 3.77–5.28)
RDW: 13.7 % (ref 11.7–15.4)
WBC: 4.8 10*3/uL (ref 3.4–10.8)

## 2019-11-12 LAB — LIPID PANEL
Chol/HDL Ratio: 4.3 ratio (ref 0.0–4.4)
Cholesterol, Total: 224 mg/dL — ABNORMAL HIGH (ref 100–199)
HDL: 52 mg/dL (ref >39)
LDL Chol Calc (NIH): 139 mg/dL — ABNORMAL HIGH (ref 0–99)
Triglycerides: 187 mg/dL — ABNORMAL HIGH (ref 0–149)
VLDL Cholesterol Cal: 33 mg/dL (ref 5–40)

## 2019-11-12 LAB — CARDIOVASCULAR RISK ASSESSMENT

## 2019-11-12 LAB — TSH: TSH: 2.97 u[IU]/mL (ref 0.450–4.500)

## 2019-11-12 MED ORDER — ROSUVASTATIN CALCIUM 5 MG PO TABS: 5.0000 mg | ORAL_TABLET | Freq: Every day | ORAL | 2 refills | Status: DC

## 2019-11-25 ENCOUNTER — Other Ambulatory Visit: Payer: Self-pay | Admitting: Physician Assistant

## 2019-12-15 ENCOUNTER — Other Ambulatory Visit: Payer: Self-pay | Admitting: Physician Assistant

## 2019-12-15 MED ORDER — BUPROPION HCL ER (SR) 150 MG PO TB12: 150.0000 mg | ORAL_TABLET | Freq: Two times a day (BID) | ORAL | 1 refills | Status: DC

## 2019-12-21 ENCOUNTER — Other Ambulatory Visit: Payer: Self-pay | Admitting: Physician Assistant

## 2020-01-18 ENCOUNTER — Other Ambulatory Visit: Payer: Self-pay | Admitting: Physician Assistant

## 2020-02-09 ENCOUNTER — Other Ambulatory Visit: Payer: Self-pay | Admitting: Physician Assistant

## 2020-02-09 DIAGNOSIS — F411 Generalized anxiety disorder: Secondary | ICD-10-CM

## 2020-02-11 ENCOUNTER — Other Ambulatory Visit: Payer: Self-pay

## 2020-02-11 MED ORDER — ALPRAZOLAM 1 MG PO TABS: 1.0000 mg | ORAL_TABLET | Freq: Three times a day (TID) | ORAL | 2 refills | Status: DC | PRN

## 2020-02-12 ENCOUNTER — Ambulatory Visit: Payer: Medicare Other | Admitting: Physician Assistant

## 2020-02-16 DIAGNOSIS — L570 Actinic keratosis: Secondary | ICD-10-CM | POA: Diagnosis not present

## 2020-03-01 ENCOUNTER — Other Ambulatory Visit: Payer: Self-pay | Admitting: Physician Assistant

## 2020-03-11 ENCOUNTER — Other Ambulatory Visit: Payer: Self-pay | Admitting: Physician Assistant

## 2020-03-15 ENCOUNTER — Other Ambulatory Visit: Payer: Self-pay | Admitting: Physician Assistant

## 2020-03-17 ENCOUNTER — Ambulatory Visit: Payer: Medicare Other | Admitting: Physician Assistant

## 2020-03-23 ENCOUNTER — Ambulatory Visit: Payer: Medicare Other | Admitting: Physician Assistant

## 2020-03-29 ENCOUNTER — Other Ambulatory Visit: Payer: Self-pay | Admitting: Physician Assistant

## 2020-04-19 ENCOUNTER — Other Ambulatory Visit: Payer: Self-pay

## 2020-04-19 ENCOUNTER — Encounter: Payer: Self-pay | Admitting: Physician Assistant

## 2020-04-19 ENCOUNTER — Ambulatory Visit (INDEPENDENT_AMBULATORY_CARE_PROVIDER_SITE_OTHER): Payer: Commercial Managed Care - PPO | Admitting: Physician Assistant

## 2020-04-19 VITALS — BP 132/80 | HR 75 | Temp 97.9°F | Resp 16 | Ht 61.0 in | Wt 161.2 lb

## 2020-04-19 DIAGNOSIS — F411 Generalized anxiety disorder: Secondary | ICD-10-CM

## 2020-04-19 DIAGNOSIS — I10 Essential (primary) hypertension: Secondary | ICD-10-CM

## 2020-04-19 DIAGNOSIS — Z23 Encounter for immunization: Secondary | ICD-10-CM | POA: Diagnosis not present

## 2020-04-19 DIAGNOSIS — E782 Mixed hyperlipidemia: Secondary | ICD-10-CM

## 2020-04-19 DIAGNOSIS — M5030 Other cervical disc degeneration, unspecified cervical region: Secondary | ICD-10-CM | POA: Diagnosis not present

## 2020-04-19 MED ORDER — DULOXETINE HCL 60 MG PO CPEP: 60.0000 mg | ORAL_CAPSULE | Freq: Two times a day (BID) | ORAL | 1 refills | Status: DC

## 2020-04-19 NOTE — Progress Notes (Signed)
Established Patient Office Visit  Subjective:  Patient ID: Kaitlyn Diaz, female    DOB: 1966/02/07  Age: 55 y.o. MRN: [id number]  CC:  Chief Complaint  Patient presents with  . Hypertension  . Hyperlipidemia    HPI Kaitlyn Diaz presents for hypertension     Pt presents for follow up of hypertension.  She is tolerating the medication well without side effects.  Compliance with treatment has been good; she takes her medication as directed, maintains her diet and exercise regimen, and follows up as directed.  She is taking lisinopril 20mg  qd    Follow up of generalized anxiety disorder.  pt is doing well on her xanax, cymbalta , buspar and wellbutrin - currently doing well on medication  Pt presents with hyperlipidemia.  Compliance with treatment has been good; she maintains her low cholesterol diet, follows up as directed, and maintains her exercise regimen.  She denies experiencing any hypercholesterolemia related symptoms.  She is currently taking fenofibrate and crestor 5mg  qd    Dx with primary generalized (osteo)arthritis; this has been diagnosed only as arthritis with no further determination of type at this time.  This was diagnosed more than 5 years ago.  The discomfort is mildly uncomfortable.  Primary joints affected include hands and fingers.  Pt takes lodine  Past Medical History:  Diagnosis Date  . Anemia   . Anxiety    "panic attacks, claustophobic on xanax"  . Arthritis   . Asthma   . Complication of anesthesia    Difficult to put to sleep, Has run fever, "runs in famly"  . COPD (chronic obstructive pulmonary disease) ([place])   . Enlarged heart    hx of  . Essential (primary) hypertension   . Family history of anesthesia complication    malignant hyperthermia on mother's side of family  . Generalized anxiety disorder   . GERD (gastroesophageal reflux disease)   . H/O hiatal hernia    hx of  . Headache(784.0)    "due to Blood pressure"  . History of  melanoma   . Hypertension    Dr. Lucita Lora, medical physician [phone]  . Malignant hyperthermia   . Mixed hyperlipidemia   . Neuromuscular disorder ([place])    "85 % nerve damage in left leg"  . Pneumonia    hx of pneumonia x 2  . PONV (postoperative nausea and vomiting)   . Spinal headache   . Urinary tract infection    and kidney infection Hx of    Past Surgical History:  Procedure Laterality Date  . ANTERIOR CERVICAL DECOMP/DISCECTOMY FUSION  12/28/2011   Procedure: ANTERIOR CERVICAL DECOMPRESSION/DISCECTOMY FUSION 2 LEVELS;  Surgeon: Elaina Hoops, MD;  Location: [facility];  Service: Neurosurgery;  Laterality: N/A;  HISTORY OF MALIGNANT HYPERTHERMIA Anterior Cervical Discectomy Fusion of Cervical five-six, Cervical six-seven  . BLADDER SUSPENSION    . BREAST SURGERY     left breast mass removed  . hand mass     1994, multiple masses removed - "all benign"  . leg mass     left leg  surgically removed  . POSTERIOR FUSION LUMBAR SPINE     L4/5  . TONSILLECTOMY     and adnoids  . TUBAL LIGATION      Family History  Problem Relation Age of Onset  . Colon cancer Mother   . Stroke Maternal Grandmother   . Stomach cancer Maternal Grandfather     Social History   Socioeconomic History  .  Marital status: Married    Spouse name: Not on file  . Number of children: 2  . Years of education: Not on file  . Highest education level: Not on file  Occupational History  . Occupation: Disabled  Tobacco Use  . Smoking status: Current Every Day Smoker    Packs/day: 1.00    Years: 34.00    Pack years: 34.00    Types: Cigarettes  . Smokeless tobacco: Never Used  Vaping Use  . Vaping Use: Never used  Substance and Sexual Activity  . Alcohol use: Yes    Comment: Drinks occasionally, and consumes wine.  . Drug use: No  . Sexual activity: Never  Other Topics Concern  . Not on file  Social History Narrative  . Not on file   Social Determinants of Health   Financial  Resource Strain: Not on file  Food Insecurity: Not on file  Transportation Needs: Not on file  Physical Activity: Not on file  Stress: Not on file  Social Connections: Not on file  Intimate Partner Violence: Not on file     Current Outpatient Medications:  .  albuterol (PROVENTIL HFA;VENTOLIN HFA) 108 (90 BASE) MCG/ACT inhaler, Inhale 1 puff into the lungs 3 (three) times daily as needed. For shortness of breath, Disp: , Rfl:  .  ALPRAZolam (XANAX) 1 MG tablet, Take 1 tablet (1 mg total) by mouth 3 (three) times daily as needed for anxiety., Disp: 90 tablet, Rfl: 2 .  amLODipine (NORVASC) 10 MG tablet, TAKE 1 TABLET BY MOUTH EVERY DAY, Disp: 90 tablet, Rfl: 1 .  benzonatate (TESSALON) 100 MG capsule, Take 100 mg by mouth 3 (three) times daily as needed., Disp: , Rfl:  .  buPROPion (WELLBUTRIN SR) 150 MG 12 hr tablet, Take 1 tablet (150 mg total) by mouth 2 (two) times daily., Disp: 60 tablet, Rfl: 1 .  busPIRone (BUSPAR) 15 MG tablet, TAKE 1 TABLET BY MOUTH 2 TIMES A DAY, Disp: 180 tablet, Rfl: 0 .  etodolac (LODINE) 400 MG tablet, Take 1 tablet (400 mg total) by mouth 2 (two) times daily., Disp: 180 tablet, Rfl: 1 .  fenofibrate micronized (LOFIBRA) 134 MG capsule, TAKE 1 CAPSULE BY MOUTH DAILY, Disp: 90 capsule, Rfl: 0 .  Fluticasone-Salmeterol (ADVAIR) 250-50 MCG/DOSE AEPB, Inhale 1 puff into the lungs every 12 (twelve) hours., Disp: , Rfl:  .  gabapentin (NEURONTIN) 300 MG capsule, TAKE 3 CAPSULES BY MOUTH 2 TIMES DAILY, Disp: 180 capsule, Rfl: 2 .  lisinopril (ZESTRIL) 20 MG tablet, TAKE 1 TABLET BY MOUTH EVERY DAY, Disp: 90 tablet, Rfl: 1 .  omeprazole (PRILOSEC) 40 MG capsule, TAKE 1 CAPSULE BY MOUTH DAILY BEFORE MEALS, Disp: 90 capsule, Rfl: 1 .  rosuvastatin (CRESTOR) 5 MG tablet, TAKE 1 TABLET BY MOUTH EVERY DAY FOR CHOLESTEROL, Disp: 30 tablet, Rfl: 2 .  terbinafine (LAMISIL) 250 MG tablet, Take 250 mg by mouth daily., Disp: , Rfl:  .  DULoxetine (CYMBALTA) 60 MG capsule, Take 1  capsule (60 mg total) by mouth 2 (two) times daily., Disp: 180 capsule, Rfl: 1   Allergies  Allergen Reactions  . Amoxicillin Anaphylaxis, Itching and Swelling  . Penicillins Itching and Swelling    Throat swelling Throat swelling   . Codeine Other (See Comments)    nausea  . Oxycodone Hcl Other (See Comments)    hallucinations    ROS CONSTITUTIONAL: Negative for chills, fatigue, fever, unintentional weight gain and unintentional weight loss.  E/N/T: Negative for ear pain, nasal congestion  and sore throat.  CARDIOVASCULAR: Negative for chest pain, dizziness, palpitations and pedal edema.  RESPIRATORY: Negative for recent cough and dyspnea.  GASTROINTESTINAL: Negative for abdominal pain, acid reflux symptoms, constipation, diarrhea, nausea and vomiting.  MSK: Negative for arthralgias and myalgias.  INTEGUMENTARY: Negative for rash.  NEUROLOGICAL: Negative for dizziness and headaches.  PSYCHIATRIC: Negative for sleep disturbance and to question depression screen.  Negative for depression, negative for anhedonia.            Objective:    PHYSICAL EXAM:   VS: BP 132/80 (BP Location: Left Arm, Patient Position: Sitting, Cuff Size: Normal)   Pulse 75   Temp 97.9 F (36.6 C) (Temporal)   Resp 16   Ht 5\' 1"  (1.549 m)   Wt 161 lb 3.2 oz (73.1 kg)   SpO2 97%   BMI 30.46 kg/m   PHYSICAL EXAM:   VS: BP 132/80 (BP Location: Left Arm, Patient Position: Sitting, Cuff Size: Normal)   Pulse 75   Temp 97.9 F (36.6 C) (Temporal)   Resp 16   Ht 5\' 1"  (1.549 m)   Wt 161 lb 3.2 oz (73.1 kg)   SpO2 97%   BMI 30.46 kg/m   GEN: Well nourished, well developed, in no acute distress   Cardiac: RRR; no murmurs, rubs, or gallops,no edema - n Respiratory:  normal respiratory rate and pattern with no distress - normal breath sounds with no rales, rhonchi, wheezes or rubs GI: normal bowel sounds, no masses or tenderness MS: no deformity or atrophy  Skin: warm and dry, no rash   Neuro:  Alert and Oriented x 3, Strength and sensation are intact - CN II-Xii grossly intact Psych: euthymic mood, appropriate affect and demeanor   BP 132/80 (BP Location: Left Arm, Patient Position: Sitting, Cuff Size: Normal)   Pulse 75   Temp 97.9 F (36.6 C) (Temporal)   Resp 16   Ht 5\' 1"  (1.549 m)   Wt 161 lb 3.2 oz (73.1 kg)   SpO2 97%   BMI 30.46 kg/m  Wt Readings from Last 3 Encounters:  04/19/20 161 lb 3.2 oz (73.1 kg)  11/11/19 147 lb (66.7 kg)  12/21/11 178 lb 9.2 oz (81 kg)     Health Maintenance Due  Topic Date Due  . TETANUS/TDAP  Never done  . PAP SMEAR-Modifier  Never done  . MAMMOGRAM  02/13/2014  . COLONOSCOPY (Pts 45-77yrs Insurance coverage will need to be confirmed)  12/16/2019    There are no preventive care reminders to display for this patient.  Lab Results  Component Value Date   TSH 2.970 11/11/2019   Lab Results  Component Value Date   WBC 4.8 11/11/2019   HGB 13.0 11/11/2019   HCT 38.9 11/11/2019   MCV 94 11/11/2019   PLT 341 11/11/2019   Lab Results  Component Value Date   NA 138 11/11/2019   K 5.5 (H) 11/11/2019   CO2 20 11/11/2019   GLUCOSE 98 11/11/2019   BUN 15 11/11/2019   CREATININE 0.98 11/11/2019   BILITOT <0.2 11/11/2019   ALKPHOS 76 11/11/2019   AST 17 11/11/2019   ALT 9 11/11/2019   PROT 7.7 11/11/2019   ALBUMIN 5.4 (H) 11/11/2019   CALCIUM 10.1 11/11/2019   Lab Results  Component Value Date   CHOL 224 (H) 11/11/2019   Lab Results  Component Value Date   HDL 52 11/11/2019   Lab Results  Component Value Date   LDLCALC 139 (H) 11/11/2019  Lab Results  Component Value Date   TRIG 187 (H) 11/11/2019   Lab Results  Component Value Date   CHOLHDL 4.3 11/11/2019   No results found for: HGBA1C    Assessment & Plan:   Problem List Items Addressed This Visit      Cardiovascular and Mediastinum   Benign essential hypertension   Relevant Orders   CBC with Differential/Platelet   Comprehensive  metabolic panel   Lipid panel   TSH     Musculoskeletal and Integument   DDD (degenerative disc disease), cervical     Other   Mixed hyperlipidemia - Primary   Relevant Orders   Lipid panel   Generalized anxiety disorder   Relevant Medications   DULoxetine (CYMBALTA) 60 MG capsule    Other Visit Diagnoses    Need for prophylactic vaccination and inoculation against influenza       Encounter for immunization       Relevant Orders   Flu Vaccine MDCK QUAD PF (Completed)      Meds ordered this encounter  Medications  . DULoxetine (CYMBALTA) 60 MG capsule    Sig: Take 1 capsule (60 mg total) by mouth 2 (two) times daily.    Dispense:  180 capsule    Refill:  1    Order Specific Question:   Supervising Provider    Answer:   Shelton Silvas    Follow-up: Return in about 6 months (around 10/17/2020) for chronic follow up.    SARA R Jessi Pitstick, PA-C

## 2020-04-20 LAB — LIPID PANEL
Chol/HDL Ratio: 3.3 ratio (ref 0.0–4.4)
Cholesterol, Total: 126 mg/dL (ref 100–199)
HDL: 38 mg/dL — ABNORMAL LOW (ref >39)
LDL Chol Calc (NIH): 70 mg/dL (ref 0–99)
Triglycerides: 94 mg/dL (ref 0–149)
VLDL Cholesterol Cal: 18 mg/dL (ref 5–40)

## 2020-04-20 LAB — COMPREHENSIVE METABOLIC PANEL
ALT: 6 IU/L (ref 0–32)
AST: 14 IU/L (ref 0–40)
Albumin/Globulin Ratio: 2 (ref 1.2–2.2)
Albumin: 4.5 g/dL (ref 3.8–4.9)
Alkaline Phosphatase: 78 IU/L (ref 44–121)
BUN/Creatinine Ratio: 19 (ref 9–23)
BUN: 13 mg/dL (ref 6–24)
Bilirubin Total: 0.2 mg/dL (ref 0.0–1.2)
CO2: 22 mmol/L (ref 20–29)
Calcium: 9.8 mg/dL (ref 8.7–10.2)
Chloride: 103 mmol/L (ref 96–106)
Creatinine, Ser: 0.69 mg/dL (ref 0.57–1.00)
GFR calc Af Amer: 113 mL/min/{1.73_m2} (ref >59)
GFR calc non Af Amer: 98 mL/min/{1.73_m2} (ref >59)
Globulin, Total: 2.3 g/dL (ref 1.5–4.5)
Glucose: 99 mg/dL (ref 65–99)
Potassium: 4.3 mmol/L (ref 3.5–5.2)
Sodium: 140 mmol/L (ref 134–144)
Total Protein: 6.8 g/dL (ref 6.0–8.5)

## 2020-04-20 LAB — CBC WITH DIFFERENTIAL/PLATELET
Basophils Absolute: 0 10*3/uL (ref 0.0–0.2)
Basos: 1 %
EOS (ABSOLUTE): 0.1 10*3/uL (ref 0.0–0.4)
Eos: 1 %
Hematocrit: 34.5 % (ref 34.0–46.6)
Hemoglobin: 11.2 g/dL (ref 11.1–15.9)
Immature Grans (Abs): 0.1 10*3/uL (ref 0.0–0.1)
Immature Granulocytes: 1 %
Lymphocytes Absolute: 1.2 10*3/uL (ref 0.7–3.1)
Lymphs: 25 %
MCH: 29.9 pg (ref 26.6–33.0)
MCHC: 32.5 g/dL (ref 31.5–35.7)
MCV: 92 fL (ref 79–97)
Monocytes Absolute: 0.4 10*3/uL (ref 0.1–0.9)
Monocytes: 9 %
Neutrophils Absolute: 3 10*3/uL (ref 1.4–7.0)
Neutrophils: 63 %
Platelets: 443 10*3/uL (ref 150–450)
RBC: 3.75 x10E6/uL — ABNORMAL LOW (ref 3.77–5.28)
RDW: 13.2 % (ref 11.7–15.4)
WBC: 4.7 10*3/uL (ref 3.4–10.8)

## 2020-04-20 LAB — CARDIOVASCULAR RISK ASSESSMENT

## 2020-04-20 LAB — TSH: TSH: 2.91 u[IU]/mL (ref 0.450–4.500)

## 2020-05-06 ENCOUNTER — Other Ambulatory Visit: Payer: Self-pay | Admitting: Family Medicine

## 2020-05-16 DIAGNOSIS — L570 Actinic keratosis: Secondary | ICD-10-CM | POA: Diagnosis not present

## 2020-05-16 DIAGNOSIS — L720 Epidermal cyst: Secondary | ICD-10-CM | POA: Diagnosis not present

## 2020-05-16 DIAGNOSIS — L821 Other seborrheic keratosis: Secondary | ICD-10-CM | POA: Diagnosis not present

## 2020-05-16 DIAGNOSIS — L578 Other skin changes due to chronic exposure to nonionizing radiation: Secondary | ICD-10-CM | POA: Diagnosis not present

## 2020-06-01 ENCOUNTER — Other Ambulatory Visit: Payer: Self-pay | Admitting: Physician Assistant

## 2020-06-01 ENCOUNTER — Other Ambulatory Visit: Payer: Self-pay | Admitting: Legal Medicine

## 2020-06-01 NOTE — Telephone Encounter (Signed)
This is your patients, thanks lp

## 2020-06-13 ENCOUNTER — Other Ambulatory Visit: Payer: Self-pay | Admitting: Family Medicine

## 2020-06-13 ENCOUNTER — Other Ambulatory Visit: Payer: Self-pay | Admitting: Physician Assistant

## 2020-06-23 ENCOUNTER — Ambulatory Visit (INDEPENDENT_AMBULATORY_CARE_PROVIDER_SITE_OTHER): Payer: Commercial Managed Care - PPO | Admitting: Physician Assistant

## 2020-06-23 ENCOUNTER — Other Ambulatory Visit: Payer: Self-pay

## 2020-06-23 ENCOUNTER — Encounter: Payer: Self-pay | Admitting: Physician Assistant

## 2020-06-23 VITALS — BP 122/80 | HR 92 | Temp 97.9°F | Resp 18 | Ht 61.0 in | Wt 161.4 lb

## 2020-06-23 DIAGNOSIS — L309 Dermatitis, unspecified: Secondary | ICD-10-CM | POA: Diagnosis not present

## 2020-06-23 MED ORDER — ALPRAZOLAM 1 MG PO TABS: 1.0000 mg | ORAL_TABLET | Freq: Three times a day (TID) | ORAL | 0 refills | Status: DC | PRN

## 2020-06-23 MED ORDER — CLOTRIMAZOLE-BETAMETHASONE 1-0.05 % EX CREA: 1.0000 "application " | TOPICAL_CREAM | Freq: Two times a day (BID) | CUTANEOUS | 1 refills | Status: DC

## 2020-06-23 MED ORDER — TRIAMCINOLONE ACETONIDE 40 MG/ML IJ SUSP
60.0000 mg | Freq: Once | INTRAMUSCULAR | Status: AC
  Administered 2020-06-23: 60 mg via INTRAMUSCULAR

## 2020-06-23 NOTE — Progress Notes (Signed)
Acute Office Visit  Subjective:    Patient ID: Kaitlyn Diaz, female    DOB: October 08, 1965, 55 y.o.   MRN: [id number]  Chief Complaint  Patient presents with  . Rash    On chest between breast, been present for 3 days. Patient complains of itching and burning. States she has tried diaper rash cream, antibiotic oint and powder.    HPI Patient is in today for rash on chest States it has been there 3 days - complains of burning and itching No new products used on chest  Past Medical History:  Diagnosis Date  . Anemia   . Anxiety    "panic attacks, claustophobic on xanax"  . Arthritis   . Asthma   . Complication of anesthesia    Difficult to put to sleep, Has run fever, "runs in famly"  . COPD (chronic obstructive pulmonary disease) ([place])   . Enlarged heart    hx of  . Essential (primary) hypertension   . Family history of anesthesia complication    malignant hyperthermia on mother's side of family  . Generalized anxiety disorder   . GERD (gastroesophageal reflux disease)   . H/O hiatal hernia    hx of  . Headache(784.0)    "due to Blood pressure"  . History of melanoma   . Hypertension    Dr. Lucita Lora, medical physician [phone]  . Malignant hyperthermia   . Mixed hyperlipidemia   . Neuromuscular disorder ([place])    "85 % nerve damage in left leg"  . Pneumonia    hx of pneumonia x 2  . PONV (postoperative nausea and vomiting)   . Spinal headache   . Urinary tract infection    and kidney infection Hx of    Past Surgical History:  Procedure Laterality Date  . ANTERIOR CERVICAL DECOMP/DISCECTOMY FUSION  12/28/2011   Procedure: ANTERIOR CERVICAL DECOMPRESSION/DISCECTOMY FUSION 2 LEVELS;  Surgeon: Elaina Hoops, MD;  Location: [facility];  Service: Neurosurgery;  Laterality: N/A;  HISTORY OF MALIGNANT HYPERTHERMIA Anterior Cervical Discectomy Fusion of Cervical five-six, Cervical six-seven  . BLADDER SUSPENSION    . BREAST SURGERY     left breast mass  removed  . hand mass     1994, multiple masses removed - "all benign"  . leg mass     left leg  surgically removed  . POSTERIOR FUSION LUMBAR SPINE     L4/5  . TONSILLECTOMY     and adnoids  . TUBAL LIGATION      Family History  Problem Relation Age of Onset  . Colon cancer Mother   . Stroke Maternal Grandmother   . Stomach cancer Maternal Grandfather     Social History   Socioeconomic History  . Marital status: Married    Spouse name: Not on file  . Number of children: 2  . Years of education: Not on file  . Highest education level: Not on file  Occupational History  . Occupation: Disabled  Tobacco Use  . Smoking status: Current Every Day Smoker    Packs/day: 1.00    Years: 34.00    Pack years: 34.00    Types: Cigarettes  . Smokeless tobacco: Never Used  Vaping Use  . Vaping Use: Never used  Substance and Sexual Activity  . Alcohol use: Not Currently    Comment: Drinks occasionally, and consumes wine.  . Drug use: No  . Sexual activity: Never  Other Topics Concern  . Not on file  Social  History Narrative  . Not on file   Social Determinants of Health   Financial Resource Strain: Not on file  Food Insecurity: Not on file  Transportation Needs: Not on file  Physical Activity: Not on file  Stress: Not on file  Social Connections: Not on file  Intimate Partner Violence: Not on file    Outpatient Medications Prior to Visit  Medication Sig Dispense Refill  . albuterol (PROVENTIL HFA;VENTOLIN HFA) 108 (90 BASE) MCG/ACT inhaler Inhale 1 puff into the lungs 3 (three) times daily as needed. For shortness of breath    . amLODipine (NORVASC) 10 MG tablet TAKE 1 TABLET BY MOUTH EVERY DAY 90 tablet 1  . benzonatate (TESSALON) 100 MG capsule Take 100 mg by mouth 3 (three) times daily as needed.    Marland Kitchen buPROPion (WELLBUTRIN SR) 150 MG 12 hr tablet Take 1 tablet (150 mg total) by mouth 2 (two) times daily. 60 tablet 1  . busPIRone (BUSPAR) 15 MG tablet TAKE 1 TABLET BY  MOUTH 2 TIMES A DAY 180 tablet 0  . DULoxetine (CYMBALTA) 60 MG capsule Take 1 capsule (60 mg total) by mouth 2 (two) times daily. 180 capsule 1  . etodolac (LODINE) 400 MG tablet Take 1 tablet (400 mg total) by mouth 2 (two) times daily. 180 tablet 1  . fenofibrate micronized (LOFIBRA) 134 MG capsule TAKE 1 CAPSULE BY MOUTH DAILY 90 capsule 0  . Fluticasone-Salmeterol (ADVAIR) 250-50 MCG/DOSE AEPB Inhale 1 puff into the lungs every 12 (twelve) hours.    . gabapentin (NEURONTIN) 300 MG capsule TAKE 3 CAPSULES BY MOUTH 2 TIMES DAILY 180 capsule 2  . lisinopril (ZESTRIL) 20 MG tablet TAKE 1 TABLET BY MOUTH EVERY DAY 90 tablet 0  . omeprazole (PRILOSEC) 40 MG capsule TAKE 1 CAPSULE BY MOUTH DAILY BEFORE MEALS 90 capsule 1  . rosuvastatin (CRESTOR) 5 MG tablet TAKE 1 TABLET BY MOUTH EVERY DAY FOR CHOLESTEROL 90 tablet 1  . terbinafine (LAMISIL) 250 MG tablet Take 250 mg by mouth daily.    Marland Kitchen ALPRAZolam (XANAX) 1 MG tablet TAKE 1 TABLET BY MOUTH 3 TIMES DAILY AS NEEDED FOR ANXIETY 90 tablet 0   No facility-administered medications prior to visit.    Allergies  Allergen Reactions  . Amoxicillin Anaphylaxis, Itching and Swelling  . Penicillins Itching and Swelling    Throat swelling Throat swelling   . Codeine Other (See Comments)    nausea  . Oxycodone Hcl Other (See Comments)    hallucinations  . Oxycodone Hcl     Review of Systems CONSTITUTIONAL: Negative for chills, fatigue, fever, unintentional weight gain and unintentional weight loss.  CARDIOVASCULAR: Negative for chest pain, dizziness, palpitations and pedal edema.  RESPIRATORY: Negative for recent cough and dyspnea.   INTEGUMENTARY: see HPI         Objective:    Physical Exam  BP 122/80 (BP Location: Left Arm, Patient Position: Sitting, Cuff Size: Normal)   Pulse 92   Temp 97.9 F (36.6 C) (Temporal)   Resp 18   Ht 5\' 1"  (1.549 m)   Wt 161 lb 6.4 oz (73.2 kg)   SpO2 97%   BMI 30.50 kg/m  Wt Readings from Last 3  Encounters:  06/23/20 161 lb 6.4 oz (73.2 kg)  04/19/20 161 lb 3.2 oz (73.1 kg)  11/11/19 147 lb (66.7 kg)   PHYSICAL EXAM:   VS: BP 122/80 (BP Location: Left Arm, Patient Position: Sitting, Cuff Size: Normal)   Pulse 92   Temp 97.9  F (36.6 C) (Temporal)   Resp 18   Ht 5\' 1"  (1.549 m)   Wt 161 lb 6.4 oz (73.2 kg)   SpO2 97%   BMI 30.50 kg/m   GEN: Well nourished, well developed, in no acute distress  Cardiac: RRR; no murmurs, rubs, or gallops,no edema -  Respiratory:  normal respiratory rate and pattern with no distress - normal breath sounds with no rales, rhonchi, wheezes or rubs  Skin: red plaque type rash between and under both breasts   Health Maintenance Due  Topic Date Due  . TETANUS/TDAP  Never done  . PAP SMEAR-Modifier  Never done  . MAMMOGRAM  02/13/2014  . COLONOSCOPY (Pts 45-27yrs Insurance coverage will need to be confirmed)  12/16/2019    There are no preventive care reminders to display for this patient.   Lab Results  Component Value Date   TSH 2.910 04/19/2020   Lab Results  Component Value Date   WBC 4.7 04/19/2020   HGB 11.2 04/19/2020   HCT 34.5 04/19/2020   MCV 92 04/19/2020   PLT 443 04/19/2020   Lab Results  Component Value Date   NA 140 04/19/2020   K 4.3 04/19/2020   CO2 22 04/19/2020   GLUCOSE 99 04/19/2020   BUN 13 04/19/2020   CREATININE 0.69 04/19/2020   BILITOT 0.2 04/19/2020   ALKPHOS 78 04/19/2020   AST 14 04/19/2020   ALT 6 04/19/2020   PROT 6.8 04/19/2020   ALBUMIN 4.5 04/19/2020   CALCIUM 9.8 04/19/2020   Lab Results  Component Value Date   CHOL 126 04/19/2020   Lab Results  Component Value Date   HDL 38 (L) 04/19/2020   Lab Results  Component Value Date   LDLCALC 70 04/19/2020   Lab Results  Component Value Date   TRIG 94 04/19/2020   Lab Results  Component Value Date   CHOLHDL 3.3 04/19/2020   No results found for: HGBA1C     Assessment & Plan:  1. Dermatitis - triamcinolone acetonide  (KENALOG-40) injection 60 mg - clotrimazole-betamethasone (LOTRISONE) cream; Apply 1 application topically 2 (two) times daily.  Dispense: 30 g; Refill: 1    Meds ordered this encounter  Medications  . triamcinolone acetonide (KENALOG-40) injection 60 mg  . clotrimazole-betamethasone (LOTRISONE) cream    Sig: Apply 1 application topically 2 (two) times daily.    Dispense:  30 g    Refill:  1    Order Specific Question:   Supervising Provider    AnswerRochel Brome [id number]  . ALPRAZolam (XANAX) 1 MG tablet    Sig: Take 1 tablet (1 mg total) by mouth 3 (three) times daily as needed for anxiety.    Dispense:  90 tablet    Refill:  0    Order Specific Question:   Supervising Provider    Answer:   Shelton Silvas    No orders of the defined types were placed in this encounter.     Follow-up: No follow-ups on file.  An After Visit Summary was printed and given to the patient.  Yetta Flock [facility] [phone]

## 2020-06-29 ENCOUNTER — Other Ambulatory Visit: Payer: Self-pay | Admitting: Physician Assistant

## 2020-07-13 ENCOUNTER — Other Ambulatory Visit: Payer: Self-pay

## 2020-07-13 ENCOUNTER — Encounter: Payer: Self-pay | Admitting: Physician Assistant

## 2020-07-13 ENCOUNTER — Ambulatory Visit (INDEPENDENT_AMBULATORY_CARE_PROVIDER_SITE_OTHER): Payer: Commercial Managed Care - PPO | Admitting: Physician Assistant

## 2020-07-13 VITALS — BP 136/84 | HR 94 | Temp 97.8°F | Resp 16 | Ht 61.0 in | Wt 160.0 lb

## 2020-07-13 DIAGNOSIS — E782 Mixed hyperlipidemia: Secondary | ICD-10-CM

## 2020-07-13 DIAGNOSIS — F411 Generalized anxiety disorder: Secondary | ICD-10-CM

## 2020-07-13 DIAGNOSIS — J060 Acute laryngopharyngitis: Secondary | ICD-10-CM

## 2020-07-13 DIAGNOSIS — I10 Essential (primary) hypertension: Secondary | ICD-10-CM | POA: Diagnosis not present

## 2020-07-13 DIAGNOSIS — M5030 Other cervical disc degeneration, unspecified cervical region: Secondary | ICD-10-CM | POA: Diagnosis not present

## 2020-07-13 DIAGNOSIS — Z23 Encounter for immunization: Secondary | ICD-10-CM | POA: Insufficient documentation

## 2020-07-13 HISTORY — DX: Encounter for immunization: Z23

## 2020-07-13 HISTORY — DX: Acute laryngopharyngitis: J06.0

## 2020-07-13 MED ORDER — AZITHROMYCIN 250 MG PO TABS: ORAL_TABLET | ORAL | 0 refills | Status: DC

## 2020-07-13 MED ORDER — PREDNISONE 20 MG PO TABS: ORAL_TABLET | ORAL | 0 refills | Status: AC

## 2020-07-13 NOTE — Progress Notes (Signed)
Established Patient Office Visit  Subjective:  Patient ID: Kaitlyn Diaz, female    DOB: [birth year]  Age: 55 y.o. MRN: [id number]  CC:  Chief Complaint  Patient presents with  . Hyperlipidemia  . Hypertension    HPI Kaitlyn Diaz presents for hypertension     Pt presents for follow up of hypertension.  She is tolerating the medication well without side effects.  Compliance with treatment has been good; she takes her medication as directed, maintains her diet and exercise regimen, and follows up as directed.  She is taking lisinopril 20mg  qd and norvasc 10mg  qd    Follow up of generalized anxiety disorder.  pt is doing well on her xanax, cymbalta , buspar and wellbutrin - currently doing well on medication and voices no concerns or problems  Pt presents with hyperlipidemia.  Compliance with treatment has been good; she maintains her low cholesterol diet, follows up as directed, and maintains her exercise regimen.  She denies experiencing any hypercholesterolemia related symptoms.  She is currently taking fenofibrate and crestor 5mg  qd    Dx with primary generalized (osteo)arthritis; this has been diagnosed only as arthritis with no further determination of type at this time.  This was diagnosed more than 5 years ago.  The discomfort is mildly uncomfortable.  Primary joints affected include hands and fingers.  Pt takes lodine which does help  Pt is due for tetanus shot  Past Medical History:  Diagnosis Date  . Anemia   . Anxiety    "panic attacks, claustophobic on xanax"  . Arthritis   . Asthma   . Complication of anesthesia    Difficult to put to sleep, Has run fever, "runs in famly"  . COPD (chronic obstructive pulmonary disease) ([place])   . Enlarged heart    hx of  . Essential (primary) hypertension   . Family history of anesthesia complication    malignant hyperthermia on mother's side of family  . Generalized anxiety disorder   . GERD (gastroesophageal reflux disease)    . H/O hiatal hernia    hx of  . Headache(784.0)    "due to Blood pressure"  . History of melanoma   . Hypertension    Dr. Lucita Lora, medical physician [phone]  . Malignant hyperthermia   . Mixed hyperlipidemia   . Neuromuscular disorder ([place])    "85 % nerve damage in left leg"  . Pneumonia    hx of pneumonia x 2  . PONV (postoperative nausea and vomiting)   . Spinal headache   . Urinary tract infection    and kidney infection Hx of    Past Surgical History:  Procedure Laterality Date  . ANTERIOR CERVICAL DECOMP/DISCECTOMY FUSION  12/28/2011   Procedure: ANTERIOR CERVICAL DECOMPRESSION/DISCECTOMY FUSION 2 LEVELS;  Surgeon: Elaina Hoops, MD;  Location: [facility];  Service: Neurosurgery;  Laterality: N/A;  HISTORY OF MALIGNANT HYPERTHERMIA Anterior Cervical Discectomy Fusion of Cervical five-six, Cervical six-seven  . BLADDER SUSPENSION    . BREAST SURGERY     left breast mass removed  . hand mass     1994, multiple masses removed - "all benign"  . leg mass     left leg  surgically removed  . POSTERIOR FUSION LUMBAR SPINE     L4/5  . TONSILLECTOMY     and adnoids  . TUBAL LIGATION      Family History  Problem Relation Age of Onset  . Colon cancer Mother   . Stroke  Maternal Grandmother   . Stomach cancer Maternal Grandfather     Social History   Socioeconomic History  . Marital status: Married    Spouse name: Not on file  . Number of children: 2  . Years of education: Not on file  . Highest education level: Not on file  Occupational History  . Occupation: Disabled  Tobacco Use  . Smoking status: Current Every Day Smoker    Packs/day: 1.00    Years: 34.00    Pack years: 34.00    Types: Cigarettes  . Smokeless tobacco: Never Used  Vaping Use  . Vaping Use: Never used  Substance and Sexual Activity  . Alcohol use: Not Currently    Comment: Drinks occasionally, and consumes wine.  . Drug use: No  . Sexual activity: Never  Other Topics Concern   . Not on file  Social History Narrative  . Not on file   Social Determinants of Health   Financial Resource Strain: Not on file  Food Insecurity: Not on file  Transportation Needs: Not on file  Physical Activity: Not on file  Stress: Not on file  Social Connections: Not on file  Intimate Partner Violence: Not on file     Current Outpatient Medications:  .  albuterol (PROVENTIL HFA;VENTOLIN HFA) 108 (90 BASE) MCG/ACT inhaler, Inhale 1 puff into the lungs 3 (three) times daily as needed. For shortness of breath, Disp: , Rfl:  .  ALPRAZolam (XANAX) 1 MG tablet, Take 1 tablet (1 mg total) by mouth 3 (three) times daily as needed for anxiety., Disp: 90 tablet, Rfl: 0 .  amLODipine (NORVASC) 10 MG tablet, TAKE 1 TABLET BY MOUTH EVERY DAY, Disp: 90 tablet, Rfl: 1 .  azithromycin (ZITHROMAX) 250 MG tablet, 2 po day one then one po days 2-5, Disp: 6 each, Rfl: 0 .  benzonatate (TESSALON) 100 MG capsule, Take 100 mg by mouth 3 (three) times daily as needed., Disp: , Rfl:  .  buPROPion (WELLBUTRIN SR) 150 MG 12 hr tablet, Take 1 tablet (150 mg total) by mouth 2 (two) times daily., Disp: 60 tablet, Rfl: 1 .  busPIRone (BUSPAR) 15 MG tablet, TAKE 1 TABLET BY MOUTH 2 TIMES A DAY, Disp: 180 tablet, Rfl: 0 .  clotrimazole-betamethasone (LOTRISONE) cream, Apply 1 application topically 2 (two) times daily., Disp: 30 g, Rfl: 1 .  DULoxetine (CYMBALTA) 60 MG capsule, Take 1 capsule (60 mg total) by mouth 2 (two) times daily., Disp: 180 capsule, Rfl: 1 .  etodolac (LODINE) 400 MG tablet, Take 1 tablet (400 mg total) by mouth 2 (two) times daily., Disp: 180 tablet, Rfl: 1 .  fenofibrate micronized (LOFIBRA) 134 MG capsule, TAKE 1 CAPSULE BY MOUTH DAILY, Disp: 90 capsule, Rfl: 0 .  Fluticasone-Salmeterol (ADVAIR) 250-50 MCG/DOSE AEPB, Inhale 1 puff into the lungs every 12 (twelve) hours., Disp: , Rfl:  .  gabapentin (NEURONTIN) 300 MG capsule, TAKE 3 CAPSULES BY MOUTH 2 TIMES DAILY, Disp: 180 capsule, Rfl:  2 .  lisinopril (ZESTRIL) 20 MG tablet, TAKE 1 TABLET BY MOUTH EVERY DAY, Disp: 90 tablet, Rfl: 0 .  omeprazole (PRILOSEC) 40 MG capsule, TAKE 1 CAPSULE BY MOUTH DAILY BEFORE MEALS, Disp: 90 capsule, Rfl: 1 .  predniSONE (DELTASONE) 20 MG tablet, Take 3 tablets (60 mg total) by mouth daily with breakfast for 3 days, THEN 2 tablets (40 mg total) daily with breakfast for 3 days, THEN 1 tablet (20 mg total) daily with breakfast for 3 days., Disp: 18 tablet, Rfl: 0 .  rosuvastatin (CRESTOR) 5 MG tablet, TAKE 1 TABLET BY MOUTH EVERY DAY FOR CHOLESTEROL, Disp: 90 tablet, Rfl: 1 .  terbinafine (LAMISIL) 250 MG tablet, Take 250 mg by mouth daily., Disp: , Rfl:    Allergies  Allergen Reactions  . Amoxicillin Anaphylaxis, Itching and Swelling  . Penicillins Itching and Swelling    Throat swelling Throat swelling   . Codeine Other (See Comments)    nausea  . Oxycodone Hcl Other (See Comments)    hallucinations  . Oxycodone Hcl     ROS CONSTITUTIONAL: Negative for chills, fatigue, fever, unintentional weight gain and unintentional weight loss.  E/N/T: has had nasal congestion CARDIOVASCULAR: Negative for chest pain, dizziness, palpitations and pedal edema.  RESPIRATORY: has had mild cough GASTROINTESTINAL: Negative for abdominal pain, acid reflux symptoms, constipation, diarrhea, nausea and vomiting.  MSK: Negative for arthralgias and myalgias.  INTEGUMENTARY: Negative for rash.  NEUROLOGICAL: Negative for dizziness and headaches.  PSYCHIATRIC: Negative for sleep disturbance and to question depression screen.  Negative for depression, negative for anhedonia.                Objective:    PHYSICAL EXAM:   VS: BP 136/84 (BP Location: Right Arm, Patient Position: Sitting, Cuff Size: Normal)   Pulse 94   Temp 97.8 F (36.6 C) (Temporal)   Resp 16   Ht 5\' 1"  (1.549 m)   Wt 160 lb (72.6 kg)   SpO2 96%   BMI 30.23 kg/m   PHYSICAL EXAM:   VS: BP 136/84 (BP Location: Right Arm,  Patient Position: Sitting, Cuff Size: Normal)   Pulse 94   Temp 97.8 F (36.6 C) (Temporal)   Resp 16   Ht 5\' 1"  (1.549 m)   Wt 160 lb (72.6 kg)   SpO2 96%   BMI 30.23 kg/m   PHYSICAL EXAM:   VS: BP 136/84 (BP Location: Right Arm, Patient Position: Sitting, Cuff Size: Normal)   Pulse 94   Temp 97.8 F (36.6 C) (Temporal)   Resp 16   Ht 5\' 1"  (1.549 m)   Wt 160 lb (72.6 kg)   SpO2 96%   BMI 30.23 kg/m   GEN: Well nourished, well developed, in no acute distress  HEENT: normal external ears and nose - normal external auditory canals and TMS -  - Lips, Teeth and Gums - normal  Oropharynx - erythema noted Cardiac: RRR; no murmurs, rubs, or gallops,no edema -  Respiratory:  scattered rhonchi and wheezes noted MS: no deformity or atrophy  Skin: warm and dry, no rash  Neuro:  Alert and Oriented x 3, Strength and sensation are intact - CN II-Xii grossly intact Psych: euthymic mood, appropriate affect and demeanor   BP 136/84 (BP Location: Right Arm, Patient Position: Sitting, Cuff Size: Normal)   Pulse 94   Temp 97.8 F (36.6 C) (Temporal)   Resp 16   Ht 5\' 1"  (1.549 m)   Wt 160 lb (72.6 kg)   SpO2 96%   BMI 30.23 kg/m  Wt Readings from Last 3 Encounters:  07/13/20 160 lb (72.6 kg)  06/23/20 161 lb 6.4 oz (73.2 kg)  04/19/20 161 lb 3.2 oz (73.1 kg)     Health Maintenance Due  Topic Date Due  . TETANUS/TDAP  Never done  . PAP SMEAR-Modifier  Never done  . MAMMOGRAM  02/13/2014  . COLONOSCOPY (Pts 45-41yrs Insurance coverage will need to be confirmed)  12/16/2019    There are no preventive care reminders to display for  this patient.  Lab Results  Component Value Date   TSH 2.910 04/19/2020   Lab Results  Component Value Date   WBC 4.7 04/19/2020   HGB 11.2 04/19/2020   HCT 34.5 04/19/2020   MCV 92 04/19/2020   PLT 443 04/19/2020   Lab Results  Component Value Date   NA 140 04/19/2020   K 4.3 04/19/2020   CO2 22 04/19/2020   GLUCOSE 99 04/19/2020    BUN 13 04/19/2020   CREATININE 0.69 04/19/2020   BILITOT 0.2 04/19/2020   ALKPHOS 78 04/19/2020   AST 14 04/19/2020   ALT 6 04/19/2020   PROT 6.8 04/19/2020   ALBUMIN 4.5 04/19/2020   CALCIUM 9.8 04/19/2020   Lab Results  Component Value Date   CHOL 126 04/19/2020   Lab Results  Component Value Date   HDL 38 (L) 04/19/2020   Lab Results  Component Value Date   LDLCALC 70 04/19/2020   Lab Results  Component Value Date   TRIG 94 04/19/2020   Lab Results  Component Value Date   CHOLHDL 3.3 04/19/2020   No results found for: HGBA1C    Assessment & Plan:   Problem List Items Addressed This Visit      Cardiovascular and Mediastinum   Benign essential hypertension   Relevant Orders   CBC with Differential/Platelet   Comprehensive metabolic panel     Respiratory   Acute laryngopharyngitis   Relevant Medications   azithromycin (ZITHROMAX) 250 MG tablet   predniSONE (DELTASONE) 20 MG tablet     Musculoskeletal and Integument   DDD (degenerative disc disease), cervical   Relevant Medications   predniSONE (DELTASONE) 20 MG tablet     Other   Mixed hyperlipidemia - Primary   Relevant Orders   Lipid panel   Generalized anxiety disorder   Need for tetanus, diphtheria, and acellular pertussis (Tdap) vaccine   Relevant Orders   Tdap vaccine greater than or equal to 7yo IM      Meds ordered this encounter  Medications  . azithromycin (ZITHROMAX) 250 MG tablet    Sig: 2 po day one then one po days 2-5    Dispense:  6 each    Refill:  0    Order Specific Question:   Supervising Provider    AnswerShelton Silvas  . predniSONE (DELTASONE) 20 MG tablet    Sig: Take 3 tablets (60 mg total) by mouth daily with breakfast for 3 days, THEN 2 tablets (40 mg total) daily with breakfast for 3 days, THEN 1 tablet (20 mg total) daily with breakfast for 3 days.    Dispense:  18 tablet    Refill:  0    Order Specific Question:   Supervising Provider    Answer:    Shelton Silvas    Follow-up: Return in about 6 months (around 01/12/2021) for chronic fasting follow up.    SARA R Eiza Canniff, PA-C

## 2020-07-14 LAB — COMPREHENSIVE METABOLIC PANEL
ALT: 9 IU/L (ref 0–32)
AST: 13 IU/L (ref 0–40)
Albumin/Globulin Ratio: 2.2 (ref 1.2–2.2)
Albumin: 5.2 g/dL — ABNORMAL HIGH (ref 3.8–4.9)
Alkaline Phosphatase: 78 IU/L (ref 44–121)
BUN/Creatinine Ratio: 23 (ref 9–23)
BUN: 22 mg/dL (ref 6–24)
Bilirubin Total: <0.2 mg/dL (ref 0.0–1.2)
CO2: 18 mmol/L — ABNORMAL LOW (ref 20–29)
Calcium: 9.8 mg/dL (ref 8.7–10.2)
Chloride: 101 mmol/L (ref 96–106)
Creatinine, Ser: 0.95 mg/dL (ref 0.57–1.00)
Globulin, Total: 2.4 g/dL (ref 1.5–4.5)
Glucose: 90 mg/dL (ref 65–99)
Potassium: 5 mmol/L (ref 3.5–5.2)
Sodium: 138 mmol/L (ref 134–144)
Total Protein: 7.6 g/dL (ref 6.0–8.5)
eGFR: 70 mL/min/{1.73_m2} (ref >59)

## 2020-07-14 LAB — CBC WITH DIFFERENTIAL/PLATELET
Basophils Absolute: 0.1 10*3/uL (ref 0.0–0.2)
Basos: 1 %
EOS (ABSOLUTE): 0.1 10*3/uL (ref 0.0–0.4)
Eos: 1 %
Hematocrit: 37.6 % (ref 34.0–46.6)
Hemoglobin: 12.6 g/dL (ref 11.1–15.9)
Immature Grans (Abs): 0.1 10*3/uL (ref 0.0–0.1)
Immature Granulocytes: 1 %
Lymphocytes Absolute: 1.4 10*3/uL (ref 0.7–3.1)
Lymphs: 23 %
MCH: 31 pg (ref 26.6–33.0)
MCHC: 33.5 g/dL (ref 31.5–35.7)
MCV: 93 fL (ref 79–97)
Monocytes Absolute: 0.4 10*3/uL (ref 0.1–0.9)
Monocytes: 7 %
Neutrophils Absolute: 4.2 10*3/uL (ref 1.4–7.0)
Neutrophils: 67 %
Platelets: 386 10*3/uL (ref 150–450)
RBC: 4.06 x10E6/uL (ref 3.77–5.28)
RDW: 13.5 % (ref 11.7–15.4)
WBC: 6.2 10*3/uL (ref 3.4–10.8)

## 2020-07-14 LAB — LIPID PANEL
Chol/HDL Ratio: 2.9 ratio (ref 0.0–4.4)
Cholesterol, Total: 138 mg/dL (ref 100–199)
HDL: 47 mg/dL (ref >39)
LDL Chol Calc (NIH): 76 mg/dL (ref 0–99)
Triglycerides: 78 mg/dL (ref 0–149)
VLDL Cholesterol Cal: 15 mg/dL (ref 5–40)

## 2020-07-14 LAB — CARDIOVASCULAR RISK ASSESSMENT

## 2020-07-26 ENCOUNTER — Other Ambulatory Visit: Payer: Self-pay | Admitting: Physician Assistant

## 2020-08-03 LAB — HM PAP SMEAR: HM Pap smear: NEGATIVE

## 2020-09-12 ENCOUNTER — Other Ambulatory Visit: Payer: Self-pay | Admitting: Physician Assistant

## 2020-09-29 ENCOUNTER — Other Ambulatory Visit: Payer: Self-pay | Admitting: Physician Assistant

## 2020-10-07 ENCOUNTER — Other Ambulatory Visit: Payer: Self-pay | Admitting: Family Medicine

## 2020-11-08 ENCOUNTER — Other Ambulatory Visit: Payer: Self-pay | Admitting: Physician Assistant

## 2020-11-08 ENCOUNTER — Other Ambulatory Visit: Payer: Self-pay | Admitting: Family Medicine

## 2020-11-13 ENCOUNTER — Encounter: Payer: Self-pay | Admitting: Family Medicine

## 2020-11-13 ENCOUNTER — Telehealth: Payer: Self-pay | Admitting: Family Medicine

## 2020-11-13 ENCOUNTER — Other Ambulatory Visit: Payer: Self-pay | Admitting: Family Medicine

## 2020-11-13 MED ORDER — CYCLOBENZAPRINE HCL 5 MG PO TABS: 5.0000 mg | ORAL_TABLET | Freq: Three times a day (TID) | ORAL | 1 refills | Status: DC | PRN

## 2020-11-13 NOTE — Telephone Encounter (Signed)
Pt threw her back out. History of back issues.  Requested muscle relaxant.  I sent flexeril 5 mg three times a day Recommend increase otc ibuprofen 800 mg three times a day.  I did not realize when I recommended this to her she has etodolac on her list of medicines, but it has not been filled since last fall. She is to call to set up an appt mid week with Gay Filler. Recommend ice or heat.

## 2020-11-15 ENCOUNTER — Encounter: Payer: Self-pay | Admitting: Physician Assistant

## 2020-11-15 ENCOUNTER — Telehealth: Payer: Self-pay | Admitting: Physician Assistant

## 2020-11-15 ENCOUNTER — Other Ambulatory Visit: Payer: Self-pay

## 2020-11-15 ENCOUNTER — Ambulatory Visit (INDEPENDENT_AMBULATORY_CARE_PROVIDER_SITE_OTHER): Payer: Medicare Other | Admitting: Physician Assistant

## 2020-11-15 VITALS — BP 130/78 | HR 87 | Temp 96.8°F | Ht 61.0 in | Wt 154.8 lb

## 2020-11-15 DIAGNOSIS — G8929 Other chronic pain: Secondary | ICD-10-CM

## 2020-11-15 DIAGNOSIS — M5442 Lumbago with sciatica, left side: Secondary | ICD-10-CM | POA: Diagnosis not present

## 2020-11-15 DIAGNOSIS — F411 Generalized anxiety disorder: Secondary | ICD-10-CM | POA: Diagnosis not present

## 2020-11-15 HISTORY — DX: Other chronic pain: G89.29

## 2020-11-15 MED ORDER — DULOXETINE HCL 60 MG PO CPEP: 60.0000 mg | ORAL_CAPSULE | Freq: Two times a day (BID) | ORAL | 1 refills | Status: DC

## 2020-11-15 MED ORDER — HYDROCODONE-ACETAMINOPHEN 5-325 MG PO TABS: 1.0000 | ORAL_TABLET | Freq: Four times a day (QID) | ORAL | 0 refills | Status: DC | PRN

## 2020-11-15 MED ORDER — PREDNISONE 20 MG PO TABS: ORAL_TABLET | ORAL | 0 refills | Status: AC

## 2020-11-15 NOTE — Telephone Encounter (Signed)
   Kaitlyn Diaz has been scheduled for the following appointment:  WHAT: MRI OF SPINE WHERE: MRI OF [place] DATE: 11/21/20 TIME: 8:45 AM ARRIVAL TIME  Patient has been made aware.

## 2020-11-15 NOTE — Progress Notes (Signed)
Acute Office Visit  Subjective:    Patient ID: Kaitlyn Diaz, female    DOB: [birth year], 55 y.o.   MRN: [id number]  Chief Complaint  Patient presents with   Back Pain    HPI Patient is in today for low back pain Pt states a month ago she had a left leg injury and since that time has intermittent leg pain and weakness First of this week she woke up and got out of bed and as soon as she put weight on her legs she had immediate back pain -- pt has history of low back pain with DJD and spondylosis with history of surgery including titanium rods/fusion of lumbar spine about 10 years ago Pt is taking flexeril 86m tid which has helped minimally -   Past Medical History:  Diagnosis Date   Anemia    Anxiety    "panic attacks, claustophobic on xanax"   Arthritis    Asthma    Complication of anesthesia    Difficult to put to sleep, Has run fever, "runs in famly"   COPD [place] obstructive pulmonary disease) [place]    Enlarged heart    hx of   Essential (primary) hypertension    Family history of anesthesia complication    malignant hyperthermia on mother's side of family   Generalized anxiety disorder    GERD (gastroesophageal reflux disease)    H/O hiatal hernia    hx of   Headache(784.0)    "due to Blood pressure"   History of melanoma    Hypertension    Dr. TLucita Lora medical physician [phone]  Malignant hyperthermia    Mixed hyperlipidemia    Neuromuscular disorder ([place]    "85 % nerve damage in left leg"   Pneumonia    hx of pneumonia x 2   PONV (postoperative nausea and vomiting)    Spinal headache    Urinary tract infection    and kidney infection Hx of    Past Surgical History:  Procedure Laterality Date   ANTERIOR CERVICAL DECOMP/DISCECTOMY FUSION  12/28/2011   Procedure: ANTERIOR CERVICAL DECOMPRESSION/DISCECTOMY FUSION 2 LEVELS;  Surgeon: GElaina Hoops MD;  Location: [facility];  Service: Neurosurgery;  Laterality: N/A;  HISTORY OF MALIGNANT  HYPERTHERMIA Anterior Cervical Discectomy Fusion of Cervical five-six, Cervical six-seven   BLADDER SUSPENSION     BREAST SURGERY     left breast mass removed   hand mass     1994, multiple masses removed - "all benign"   leg mass     left leg  surgically removed   POSTERIOR FUSION LUMBAR SPINE     L4/5   TONSILLECTOMY     and adnoids   TUBAL LIGATION      Family History  Problem Relation Age of Onset   Colon cancer Mother    Stroke Maternal Grandmother    Stomach cancer Maternal Grandfather     Social History   Socioeconomic History   Marital status: Married    Spouse name: Not on file   Number of children: 2   Years of education: Not on file   Highest education level: Not on file  Occupational History   Occupation: Disabled  Tobacco Use   Smoking status: Every Day    Packs/day: 1.00    Years: 34.00    Pack years: 34.00    Types: Cigarettes   Smokeless tobacco: Never  Vaping Use   Vaping Use: Never used  Substance and Sexual Activity  Alcohol use: Not Currently    Comment: Drinks occasionally, and consumes wine.   Drug use: No   Sexual activity: Never  Other Topics Concern   Not on file  Social History Narrative   Not on file   Social Determinants of Health   Financial Resource Strain: Not on file  Food Insecurity: Not on file  Transportation Needs: Not on file  Physical Activity: Not on file  Stress: Not on file  Social Connections: Not on file  Intimate Partner Violence: Not on file    Outpatient Medications Prior to Visit  Medication Sig Dispense Refill   albuterol (PROVENTIL HFA;VENTOLIN HFA) 108 (90 BASE) MCG/ACT inhaler Inhale 1 puff into the lungs 3 (three) times daily as needed. For shortness of breath     ALPRAZolam (XANAX) 1 MG tablet TAKE 1 TABLET BY MOUTH 3 TIMES DAILY AS NEEDED FOR ANXIETY 90 tablet 0   amLODipine (NORVASC) 10 MG tablet TAKE 1 TABLET BY MOUTH EVERY DAY 90 tablet 1   azithromycin (ZITHROMAX) 250 MG tablet 2 po day one  then one po days 2-5 6 each 0   benzonatate (TESSALON) 100 MG capsule Take 100 mg by mouth 3 (three) times daily as needed.     buPROPion (WELLBUTRIN SR) 150 MG 12 hr tablet Take 1 tablet (150 mg total) by mouth 2 (two) times daily. 60 tablet 1   busPIRone (BUSPAR) 15 MG tablet TAKE 1 TABLET BY MOUTH 2 TIMES A DAY 180 tablet 0   clotrimazole-betamethasone (LOTRISONE) cream Apply 1 application topically 2 (two) times daily. 30 g 1   cyclobenzaprine (FLEXERIL) 10 MG tablet Take 5-10 mg by mouth 2 (two) times daily as needed.     DULoxetine (CYMBALTA) 60 MG capsule Take 1 capsule (60 mg total) by mouth 2 (two) times daily. 180 capsule 1   etodolac (LODINE) 400 MG tablet Take 1 tablet (400 mg total) by mouth 2 (two) times daily. 180 tablet 1   fenofibrate micronized (LOFIBRA) 134 MG capsule TAKE 1 CAPSULE BY MOUTH DAILY 90 capsule 0   Fluticasone-Salmeterol (ADVAIR) 250-50 MCG/DOSE AEPB Inhale 1 puff into the lungs every 12 (twelve) hours.     gabapentin (NEURONTIN) 300 MG capsule TAKE 3 CAPSULES BY MOUTH 2 TIMES DAILY 180 capsule 2   lisinopril (ZESTRIL) 20 MG tablet TAKE 1 TABLET BY MOUTH EVERY DAY 90 tablet 0   omeprazole (PRILOSEC) 40 MG capsule TAKE 1 CAPSULE BY MOUTH DAILY BEFORE MEALS 90 capsule 1   rosuvastatin (CRESTOR) 5 MG tablet TAKE 1 TABLET BY MOUTH EVERY DAY FOR CHOLESTEROL 90 tablet 1   terbinafine (LAMISIL) 250 MG tablet Take 250 mg by mouth daily.     cyclobenzaprine (FLEXERIL) 5 MG tablet Take 1 tablet (5 mg total) by mouth 3 (three) times daily as needed for muscle spasms. 90 tablet 1   No facility-administered medications prior to visit.    Allergies  Allergen Reactions   Amoxicillin Anaphylaxis, Itching and Swelling   Penicillins Itching and Swelling    Throat swelling Throat swelling    Codeine Other (See Comments)    nausea   Oxycodone Hcl Other (See Comments)    hallucinations   Oxycodone Hcl     Review of Systems CONSTITUTIONAL: Negative for chills, fatigue,  fever, unintentional weight gain and unintentional weight loss.  CARDIOVASCULAR: Negative for chest pain, dizziness, palpitations and pedal edema.  RESPIRATORY: Negative for recent cough and dyspnea.   MSK: see HPI    INTEGUMENTARY: Negative for rash.  NEUROLOGICAL: see HPI         Objective:    Physical Exam PHYSICAL EXAM:   VS: BP 130/78 (BP Location: Left Arm, Patient Position: Sitting, Cuff Size: Normal)   Pulse 87   Temp (!) 96.8 F (36 C) (Temporal)   Ht 5' 1"  (1.549 m)   Wt 154 lb 12.8 oz (70.2 kg)   SpO2 98%   BMI 29.25 kg/m   GEN: Well nourished, well developed, in no acute distress  Cardiac: RRR; no murmurs, rubs, or gallops,no edema - Respiratory:  normal respiratory rate and pattern with no distress - normal breath sounds with no rales, rhonchi, wheezes or rubs MS: tenderness to lower lumbar spine - slowed gait noted Skin: warm and dry, no rash  Psych: euthymic mood, appropriate affect and demeanor  BP 130/78 (BP Location: Left Arm, Patient Position: Sitting, Cuff Size: Normal)   Pulse 87   Temp (!) 96.8 F (36 C) (Temporal)   Ht 5' 1"  (1.549 m)   Wt 154 lb 12.8 oz (70.2 kg)   SpO2 98%   BMI 29.25 kg/m  Wt Readings from Last 3 Encounters:  11/15/20 154 lb 12.8 oz (70.2 kg)  07/13/20 160 lb (72.6 kg)  06/23/20 161 lb 6.4 oz (73.2 kg)    Health Maintenance Due  Topic Date Due   Pneumococcal Vaccine 46-39 Years old (1 - PCV) Never done   PAP SMEAR-Modifier  Never done   MAMMOGRAM  02/13/2014   Zoster Vaccines- Shingrix (1 of 2) Never done   COLONOSCOPY (Pts 45-50yr Insurance coverage will need to be confirmed)  12/16/2019   INFLUENZA VACCINE  11/07/2020    There are no preventive care reminders to display for this patient.   Lab Results  Component Value Date   TSH 2.910 04/19/2020   Lab Results  Component Value Date   WBC 6.2 07/13/2020   HGB 12.6 07/13/2020   HCT 37.6 07/13/2020   MCV 93 07/13/2020   PLT 386 07/13/2020   Lab Results   Component Value Date   NA 138 07/13/2020   K 5.0 07/13/2020   CO2 18 (L) 07/13/2020   GLUCOSE 90 07/13/2020   BUN 22 07/13/2020   CREATININE 0.95 07/13/2020   BILITOT <0.2 07/13/2020   ALKPHOS 78 07/13/2020   AST 13 07/13/2020   ALT 9 07/13/2020   PROT 7.6 07/13/2020   ALBUMIN 5.2 (H) 07/13/2020   CALCIUM 9.8 07/13/2020   EGFR 70 07/13/2020   Lab Results  Component Value Date   CHOL 138 07/13/2020   Lab Results  Component Value Date   HDL 47 07/13/2020   Lab Results  Component Value Date   LDLCALC 76 07/13/2020   Lab Results  Component Value Date   TRIG 78 07/13/2020   Lab Results  Component Value Date   CHOLHDL 2.9 07/13/2020   No results found for: HGBA1C     Assessment & Plan:  1. Chronic left-sided low back pain with left-sided sciatica - worsening    No orders of the defined types were placed in this encounter.   No orders of the defined types were placed in this encounter.     Follow-up: Return if symptoms worsen or fail to improve.  An After Visit Summary was printed and given to the patient.  SYetta [facility] ([phone]

## 2020-11-21 DIAGNOSIS — M545 Low back pain, unspecified: Secondary | ICD-10-CM | POA: Diagnosis not present

## 2020-11-21 DIAGNOSIS — M5442 Lumbago with sciatica, left side: Secondary | ICD-10-CM | POA: Diagnosis not present

## 2020-11-22 ENCOUNTER — Encounter: Payer: Self-pay | Admitting: Physician Assistant

## 2020-11-22 ENCOUNTER — Telehealth: Payer: Self-pay

## 2020-11-22 ENCOUNTER — Ambulatory Visit (INDEPENDENT_AMBULATORY_CARE_PROVIDER_SITE_OTHER): Payer: Medicare Other | Admitting: Physician Assistant

## 2020-11-22 ENCOUNTER — Other Ambulatory Visit: Payer: Self-pay

## 2020-11-22 VITALS — BP 110/86 | HR 60 | Temp 98.1°F | Ht 61.0 in | Wt 160.2 lb

## 2020-11-22 DIAGNOSIS — R600 Localized edema: Secondary | ICD-10-CM

## 2020-11-22 DIAGNOSIS — G8929 Other chronic pain: Secondary | ICD-10-CM | POA: Diagnosis not present

## 2020-11-22 DIAGNOSIS — M5442 Lumbago with sciatica, left side: Secondary | ICD-10-CM

## 2020-11-22 MED ORDER — FUROSEMIDE 20 MG PO TABS: 20.0000 mg | ORAL_TABLET | Freq: Every day | ORAL | 3 refills | Status: DC

## 2020-11-22 NOTE — Progress Notes (Signed)
Acute Office Visit  Subjective:    Patient ID: Kaitlyn Diaz, female    DOB: [birth year], 54 y.o.   MRN: [id number]  Chief Complaint  Patient presents with   Foot Swelling    Bilateral feet swelling, started last Wednesday.     HPI Patient is in today for bilateral foot swelling - pt states the edema started last week after starting prednisone for her back pain - denies chest pain, sob Has used lasix in the past for swelling similar to what is going on now  Pt had MRI of lumbar spine and neurosurgeon consult was recommended - she would like to see Dr Saintclair Halsted Past Medical History:  Diagnosis Date   Anemia    Anxiety    "panic attacks, claustophobic on xanax"   Arthritis    Asthma    Complication of anesthesia    Difficult to put to sleep, Has run fever, "runs in famly"   COPD (chronic obstructive pulmonary disease) ([employer])    Enlarged heart    hx of   Essential (primary) hypertension    Family history of anesthesia complication    malignant hyperthermia on mother's side of family   Generalized anxiety disorder    GERD (gastroesophageal reflux disease)    H/O hiatal hernia    hx of   Headache(784.0)    "due to Blood pressure"   History of melanoma    Hypertension    Dr. Lucita Lora, medical physician [phone]   Malignant hyperthermia    Mixed hyperlipidemia    Neuromuscular disorder ([place])    "85 % nerve damage in left leg"   Pneumonia    hx of pneumonia x 2   PONV (postoperative nausea and vomiting)    Spinal headache    Urinary tract infection    and kidney infection Hx of    Past Surgical History:  Procedure Laterality Date   ANTERIOR CERVICAL DECOMP/DISCECTOMY FUSION  12/28/2011   Procedure: ANTERIOR CERVICAL DECOMPRESSION/DISCECTOMY FUSION 2 LEVELS;  Surgeon: Elaina Hoops, MD;  Location: [facility];  Service: Neurosurgery;  Laterality: N/A;  HISTORY OF MALIGNANT HYPERTHERMIA Anterior Cervical Discectomy Fusion of Cervical five-six, Cervical six-seven    BLADDER SUSPENSION     BREAST SURGERY     left breast mass removed   hand mass     1994, multiple masses removed - "all benign"   leg mass     left leg  surgically removed   POSTERIOR FUSION LUMBAR SPINE     L4/5   TONSILLECTOMY     and adnoids   TUBAL LIGATION      Family History  Problem Relation Age of Onset   Colon cancer Mother    Stroke Maternal Grandmother    Stomach cancer Maternal Grandfather     Social History   Socioeconomic History   Marital status: Married    Spouse name: Not on file   Number of children: 2   Years of education: Not on file   Highest education level: Not on file  Occupational History   Occupation: Disabled  Tobacco Use   Smoking status: Every Day    Packs/day: 1.00    Years: 34.00    Pack years: 34.00    Types: Cigarettes   Smokeless tobacco: Never  Vaping Use   Vaping Use: Never used  Substance and Sexual Activity   Alcohol use: Not Currently    Comment: Drinks occasionally, and consumes wine.   Drug use: No   Sexual  activity: Never  Other Topics Concern   Not on file  Social History Narrative   Not on file   Social Determinants of Health   Financial Resource Strain: Not on file  Food Insecurity: Not on file  Transportation Needs: Not on file  Physical Activity: Not on file  Stress: Not on file  Social Connections: Not on file  Intimate Partner Violence: Not on file    Outpatient Medications Prior to Visit  Medication Sig Dispense Refill   albuterol (PROVENTIL HFA;VENTOLIN HFA) 108 (90 BASE) MCG/ACT inhaler Inhale 1 puff into the lungs 3 (three) times daily as needed. For shortness of breath     ALPRAZolam (XANAX) 1 MG tablet TAKE 1 TABLET BY MOUTH 3 TIMES DAILY AS NEEDED FOR ANXIETY 90 tablet 0   amLODipine (NORVASC) 10 MG tablet TAKE 1 TABLET BY MOUTH EVERY DAY 90 tablet 1   benzonatate (TESSALON) 100 MG capsule Take 100 mg by mouth 3 (three) times daily as needed.     buPROPion (WELLBUTRIN SR) 150 MG 12 hr tablet  Take 1 tablet (150 mg total) by mouth 2 (two) times daily. 60 tablet 1   busPIRone (BUSPAR) 15 MG tablet TAKE 1 TABLET BY MOUTH 2 TIMES A DAY 180 tablet 0   clotrimazole-betamethasone (LOTRISONE) cream Apply 1 application topically 2 (two) times daily. 30 g 1   cyclobenzaprine (FLEXERIL) 10 MG tablet Take 5-10 mg by mouth 2 (two) times daily as needed.     DULoxetine (CYMBALTA) 60 MG capsule Take 1 capsule (60 mg total) by mouth 2 (two) times daily. 180 capsule 1   etodolac (LODINE) 400 MG tablet Take 1 tablet (400 mg total) by mouth 2 (two) times daily. 180 tablet 1   fenofibrate micronized (LOFIBRA) 134 MG capsule TAKE 1 CAPSULE BY MOUTH DAILY 90 capsule 0   Fluticasone-Salmeterol (ADVAIR) 250-50 MCG/DOSE AEPB Inhale 1 puff into the lungs every 12 (twelve) hours.     gabapentin (NEURONTIN) 300 MG capsule TAKE 3 CAPSULES BY MOUTH 2 TIMES DAILY 180 capsule 2   HYDROcodone-acetaminophen (NORCO/VICODIN) 5-325 MG tablet Take 1 tablet by mouth every 6 (six) hours as needed for severe pain (OSteoarthritis of lumbar spine.). 20 tablet 0   lisinopril (ZESTRIL) 20 MG tablet TAKE 1 TABLET BY MOUTH EVERY DAY 90 tablet 0   omeprazole (PRILOSEC) 40 MG capsule TAKE 1 CAPSULE BY MOUTH DAILY BEFORE MEALS 90 capsule 1   predniSONE (DELTASONE) 20 MG tablet Take 3 tablets (60 mg total) by mouth daily with breakfast for 3 days, THEN 2 tablets (40 mg total) daily with breakfast for 3 days, THEN 1 tablet (20 mg total) daily with breakfast for 3 days. 18 tablet 0   rosuvastatin (CRESTOR) 5 MG tablet TAKE 1 TABLET BY MOUTH EVERY DAY FOR CHOLESTEROL 90 tablet 1   terbinafine (LAMISIL) 250 MG tablet Take 250 mg by mouth daily.     No facility-administered medications prior to visit.    Allergies  Allergen Reactions   Amoxicillin Anaphylaxis, Itching and Swelling   Penicillins Itching and Swelling    Throat swelling Throat swelling    Codeine Other (See Comments)    nausea   Oxycodone Hcl Other (See Comments)     hallucinations   Oxycodone Hcl     Review of Systems CONSTITUTIONAL: Negative for chills, fatigue, fever, unintentional weight gain and unintentional weight loss.  CARDIOVASCULAR: Negative for chest pain, dizziness, palpitations  RESPIRATORY: Negative for recent cough and dyspnea.  MSK: see HPI INTEGUMENTARY: Negative for  rash.         Objective:    Physical Exam PHYSICAL EXAM:   VS: BP 110/86   Pulse 60   Temp 98.1 F (36.7 C)   Ht _0  (1.549 m)   Wt 160 lb 3.2 oz (72.7 kg)   SpO2 98%   BMI 30.27 kg/m   GEN: Well nourished, well developed, in no acute distress but walking slow and stooped from her back pain Cardiac: RRR; no murmurs, rubs, or gallops, 1 plus pitting edema noted bilaterally Respiratory:  normal respiratory rate and pattern with no distress - normal breath sounds with no rales, rhonchi, wheezes or rubs Skin: warm and dry, no rash   BP 110/86   Pulse 60   Temp 98.1 F (36.7 C)   Ht _1  (1.549 m)   Wt 160 lb 3.2 oz (72.7 kg)   SpO2 98%   BMI 30.27 kg/m  Wt Readings from Last 3 Encounters:  11/22/20 160 lb 3.2 oz (72.7 kg)  11/15/20 154 lb 12.8 oz (70.2 kg)  07/13/20 160 lb (72.6 kg)    Health Maintenance Due  Topic Date Due   Pneumococcal Vaccine 3-26 Years old (1 - PCV) Never done   PAP SMEAR-Modifier  Never done   MAMMOGRAM  02/13/2014   Zoster Vaccines- Shingrix (1 of 2) Never done   COLONOSCOPY (Pts 45-45yr Insurance coverage will need to be confirmed)  12/16/2019   INFLUENZA VACCINE  11/07/2020    There are no preventive care reminders to display for this patient.   Lab Results  Component Value Date   TSH 2.910 04/19/2020   Lab Results  Component Value Date   WBC 6.2 07/13/2020   HGB 12.6 07/13/2020   HCT 37.6 07/13/2020   MCV 93 07/13/2020   PLT 386 07/13/2020   Lab Results  Component Value Date   NA 138 07/13/2020   K 5.0 07/13/2020   CO2 18 (L) 07/13/2020   GLUCOSE 90 07/13/2020   BUN 22 07/13/2020   CREATININE  0.95 07/13/2020   BILITOT <0.2 07/13/2020   ALKPHOS 78 07/13/2020   AST 13 07/13/2020   ALT 9 07/13/2020   PROT 7.6 07/13/2020   ALBUMIN 5.2 (H) 07/13/2020   CALCIUM 9.8 07/13/2020   EGFR 70 07/13/2020   Lab Results  Component Value Date   CHOL 138 07/13/2020   Lab Results  Component Value Date   HDL 47 07/13/2020   Lab Results  Component Value Date   LDLCALC 76 07/13/2020   Lab Results  Component Value Date   TRIG 78 07/13/2020   Lab Results  Component Value Date   CHOLHDL 2.9 07/13/2020   No results found for: HGBA1C     Assessment & Plan:  1. Localized edema - Comprehensive metabolic panel - furosemide (LASIX) 20 MG tablet; Take 1 tablet (20 mg total) by mouth daily.  Dispense: 30 tablet; Refill: 3    Meds ordered this encounter  Medications   furosemide (LASIX) 20 MG tablet    Sig: Take 1 tablet (20 mg total) by mouth daily.    Dispense:  30 tablet    Refill:  3    Order Specific Question:   Supervising Provider    Answer:Shelton Silvas   Orders Placed This Encounter  Procedures   Comprehensive metabolic panel     Follow-up: Return if symptoms worsen or fail to improve.  An After Visit Summary was printed and given to the patient.  Yetta Flock [facility] [phone]

## 2020-11-22 NOTE — Telephone Encounter (Signed)
Pt calling and states her feet are swollen, bilaterally. It is moving up her calves. States it is hard and hurts to walk. She has been wearing pull on shoes due to swelling. Denies chest pain/tightness and SOB. States feet are normal in color "they are just swollen, they actually remind me of when I was pregnant." She is requesting lasix or fluid pill. Please advise.   Royce Macadamia, [place] 11/22/20 8:20 AM

## 2020-11-22 NOTE — Telephone Encounter (Signed)
Called pt. Pt VU. Made same day appointment.   Harrell Lark 11/22/20 9:38 AM

## 2020-11-23 LAB — COMPREHENSIVE METABOLIC PANEL
ALT: 11 IU/L (ref 0–32)
AST: 22 IU/L (ref 0–40)
Albumin/Globulin Ratio: 2 (ref 1.2–2.2)
Albumin: 4.8 g/dL (ref 3.8–4.9)
Alkaline Phosphatase: 68 IU/L (ref 44–121)
BUN/Creatinine Ratio: 19 (ref 9–23)
BUN: 15 mg/dL (ref 6–24)
Bilirubin Total: 0.3 mg/dL (ref 0.0–1.2)
CO2: 18 mmol/L — ABNORMAL LOW (ref 20–29)
Calcium: 9.6 mg/dL (ref 8.7–10.2)
Chloride: 99 mmol/L (ref 96–106)
Creatinine, Ser: 0.77 mg/dL (ref 0.57–1.00)
Globulin, Total: 2.4 g/dL (ref 1.5–4.5)
Glucose: 96 mg/dL (ref 65–99)
Potassium: 5.1 mmol/L (ref 3.5–5.2)
Sodium: 140 mmol/L (ref 134–144)
Total Protein: 7.2 g/dL (ref 6.0–8.5)
eGFR: 90 mL/min/{1.73_m2} (ref >59)

## 2020-11-24 ENCOUNTER — Other Ambulatory Visit: Payer: Self-pay | Admitting: Physician Assistant

## 2020-11-24 ENCOUNTER — Other Ambulatory Visit: Payer: Self-pay

## 2020-11-24 DIAGNOSIS — G8929 Other chronic pain: Secondary | ICD-10-CM

## 2020-11-24 MED ORDER — HYDROCODONE-ACETAMINOPHEN 10-325 MG PO TABS: 1.0000 | ORAL_TABLET | Freq: Three times a day (TID) | ORAL | 0 refills | Status: AC | PRN

## 2020-11-24 MED ORDER — ROSUVASTATIN CALCIUM 5 MG PO TABS: ORAL_TABLET | ORAL | 1 refills | Status: DC

## 2020-11-24 NOTE — Telephone Encounter (Signed)
Pt returned call. Pt is aware meds were sent. Pt VU.   Royce Macadamia, [place] 11/24/20 10:06 AM

## 2020-11-24 NOTE — Telephone Encounter (Signed)
Pt states she was told she would receive pain medication until neuro appointment. Pended current pain medication. Please advise.  Also requesting crestor refill. Pended.   Royce Macadamia, [place] 11/24/20 8:21 AM

## 2020-11-24 NOTE — Telephone Encounter (Signed)
Called pt as pt requested once sent. Call ended without answer, could not leave VM.

## 2020-11-30 ENCOUNTER — Ambulatory Visit: Payer: Medicare Other | Admitting: Physician Assistant

## 2020-11-30 ENCOUNTER — Other Ambulatory Visit: Payer: Self-pay | Admitting: Physician Assistant

## 2020-11-30 ENCOUNTER — Telehealth: Payer: Self-pay

## 2020-11-30 MED ORDER — HYDROCODONE-ACETAMINOPHEN 10-325 MG PO TABS: 1.0000 | ORAL_TABLET | Freq: Three times a day (TID) | ORAL | 0 refills | Status: AC | PRN

## 2020-11-30 NOTE — Telephone Encounter (Signed)
Patient called need a refill on her hydrocodone, she is still having pain. She also stated that her feet and legs are still swollen and the lasix is not helping wanted to know if she can take two pills?

## 2020-11-30 NOTE — Telephone Encounter (Signed)
Patient made aware, verbalized understanding. Specialist has not called and made appointment for her yet.

## 2020-12-06 ENCOUNTER — Telehealth: Payer: Self-pay

## 2020-12-06 NOTE — Telephone Encounter (Signed)
Pt called requesting update of neurosurgery referral. Made pt aware referral had been sent and we were still waiting to hear back from them, but would request update from referral coordinator. Pt questioned where referral was sent. Let her know it was sent to [facility] for Dr. Saintclair Halsted. Pt became upset that referral was sent to this provider. States he did her surgery and did not treat her correctly. Pt states she was told she would not be sent to Dr Saintclair Halsted and would be sent to somewhere else. Pt requested Gay Filler refer her to a different provider preferably "[facility]." Pt requested return call when a new provider was chosen.   Royce Macadamia, [place] 12/06/20 11:54 AM

## 2020-12-07 NOTE — Telephone Encounter (Signed)
Attempted to call pt. No answer, left generic VM on non-identifying VM box.   Kaitlyn Diaz, [place] 12/07/20 11:01 AM

## 2020-12-08 NOTE — Telephone Encounter (Signed)
Spoke to pt and gave her # to [facility] for her to call them and schedule an appointment

## 2020-12-13 ENCOUNTER — Other Ambulatory Visit: Payer: Self-pay | Admitting: Family Medicine

## 2020-12-13 DIAGNOSIS — M5440 Lumbago with sciatica, unspecified side: Secondary | ICD-10-CM | POA: Diagnosis not present

## 2020-12-17 ENCOUNTER — Other Ambulatory Visit: Payer: Self-pay | Admitting: Family Medicine

## 2020-12-19 ENCOUNTER — Other Ambulatory Visit: Payer: Self-pay

## 2020-12-19 MED ORDER — ALPRAZOLAM 1 MG PO TABS: 1.0000 mg | ORAL_TABLET | Freq: Three times a day (TID) | ORAL | 0 refills | Status: DC | PRN

## 2020-12-22 DIAGNOSIS — M5416 Radiculopathy, lumbar region: Secondary | ICD-10-CM | POA: Diagnosis not present

## 2020-12-22 DIAGNOSIS — M5137 Other intervertebral disc degeneration, lumbosacral region: Secondary | ICD-10-CM | POA: Diagnosis not present

## 2020-12-22 DIAGNOSIS — M47897 Other spondylosis, lumbosacral region: Secondary | ICD-10-CM | POA: Diagnosis not present

## 2020-12-22 DIAGNOSIS — Z981 Arthrodesis status: Secondary | ICD-10-CM | POA: Diagnosis not present

## 2021-01-04 ENCOUNTER — Other Ambulatory Visit: Payer: Self-pay | Admitting: Physician Assistant

## 2021-01-11 ENCOUNTER — Other Ambulatory Visit: Payer: Self-pay

## 2021-01-11 MED ORDER — CYCLOBENZAPRINE HCL 10 MG PO TABS: 10.0000 mg | ORAL_TABLET | Freq: Two times a day (BID) | ORAL | 1 refills | Status: DC | PRN

## 2021-01-11 MED ORDER — ALPRAZOLAM 1 MG PO TABS: 1.0000 mg | ORAL_TABLET | Freq: Three times a day (TID) | ORAL | 0 refills | Status: DC | PRN

## 2021-01-11 NOTE — Telephone Encounter (Signed)
Patient called requesting refills on her Xanax, and she stated Oxycodone which I don't see in her medication list. Then she was also requesting if you could change her Etodlac stating that it did not work for her Arthritis at all. Please Advise.

## 2021-01-13 NOTE — Telephone Encounter (Signed)
Pt called asking why certain medications were not filled. Made her aware she needed to reach out to neurosurgeon for pain medication for back. Pt stated "he probably wont because my appointment is so far out." States she saw him last 12/16/20. Again advised she reach out to her neurosurgeon as she is under his care for back.   Pt states she has not tried any other medications for arthritis.   Kaitlyn Diaz, [place] 01/13/21 12:02 PM

## 2021-01-16 ENCOUNTER — Other Ambulatory Visit: Payer: Self-pay | Admitting: Physician Assistant

## 2021-01-23 DIAGNOSIS — L821 Other seborrheic keratosis: Secondary | ICD-10-CM | POA: Diagnosis not present

## 2021-01-23 DIAGNOSIS — L570 Actinic keratosis: Secondary | ICD-10-CM | POA: Diagnosis not present

## 2021-01-23 DIAGNOSIS — L578 Other skin changes due to chronic exposure to nonionizing radiation: Secondary | ICD-10-CM | POA: Diagnosis not present

## 2021-02-08 ENCOUNTER — Other Ambulatory Visit: Payer: Self-pay | Admitting: Physician Assistant

## 2021-02-20 ENCOUNTER — Other Ambulatory Visit: Payer: Self-pay | Admitting: Physician Assistant

## 2021-02-28 ENCOUNTER — Encounter: Payer: Self-pay | Admitting: Physician Assistant

## 2021-02-28 ENCOUNTER — Telehealth (INDEPENDENT_AMBULATORY_CARE_PROVIDER_SITE_OTHER): Payer: Medicare Other | Admitting: Physician Assistant

## 2021-02-28 VITALS — BP 120/74 | HR 96 | Ht 61.5 in | Wt 150.0 lb

## 2021-02-28 DIAGNOSIS — J060 Acute laryngopharyngitis: Secondary | ICD-10-CM | POA: Diagnosis not present

## 2021-02-28 MED ORDER — AZITHROMYCIN 250 MG PO TABS: ORAL_TABLET | ORAL | 0 refills | Status: AC

## 2021-02-28 MED ORDER — PROMETHAZINE-DM 6.25-15 MG/5ML PO SYRP: 5.0000 mL | ORAL_SOLUTION | Freq: Four times a day (QID) | ORAL | 0 refills | Status: DC | PRN

## 2021-02-28 NOTE — Progress Notes (Signed)
Virtual Visit via Telephone Note   This visit type was conducted due to national recommendations for restrictions regarding the COVID-19 Pandemic (e.g. social distancing) in an effort to limit this patient's exposure and mitigate transmission in our community.  Due to her co-morbid illnesses, this patient is at least at moderate risk for complications without adequate follow up.  This format is felt to be most appropriate for this patient at this time.  The patient did not have access to video technology/had technical difficulties with video requiring transitioning to audio format only (telephone).  All issues noted in this document were discussed and addressed.  No physical exam could be performed with this format.  Patient verbally consented to a telehealth visit.   Date:  02/28/2021   ID:  Kaitlyn Diaz, DOB [birth year], MRN [id number]  Patient Location: Home Provider Location: Office  PCP:  Marge Duncans, PA-C    Chief Complaint:  pt with COVID and now with productive cough/congestion  History of Present Illness:    Kaitlyn Diaz is a 55 y.o. female with complaints of productive cough that has been going on a few days - she did take a home COVID test 3 days ago which was positive She was having headaches and bodyaches and those have resolved Denies chest pain or dyspnea  The patient does have symptoms concerning for COVID-19 infection (fever, chills, cough, or new shortness of breath).    Past Medical History:  Diagnosis Date   Anemia    Anxiety    "panic attacks, claustophobic on xanax"   Arthritis    Asthma    Complication of anesthesia    Difficult to put to sleep, Has run fever, "runs in famly"   COPD (chronic obstructive pulmonary disease) ([place])    Enlarged heart    hx of   Essential (primary) hypertension    Family history of anesthesia complication    malignant hyperthermia on mother's side of family   Generalized anxiety disorder    GERD (gastroesophageal  reflux disease)    H/O hiatal hernia    hx of   Headache(784.0)    "due to Blood pressure"   History of melanoma    Hypertension    Dr. Lucita Lora, medical physician [phone]   Malignant hyperthermia    Mixed hyperlipidemia    Neuromuscular disorder ([place])    "85 % nerve damage in left leg"   Pneumonia    hx of pneumonia x 2   PONV (postoperative nausea and vomiting)    Spinal headache    Urinary tract infection    and kidney infection Hx of   Past Surgical History:  Procedure Laterality Date   ANTERIOR CERVICAL DECOMP/DISCECTOMY FUSION  12/28/2011   Procedure: ANTERIOR CERVICAL DECOMPRESSION/DISCECTOMY FUSION 2 LEVELS;  Surgeon: Elaina Hoops, MD;  Location: [facility];  Service: Neurosurgery;  Laterality: N/A;  HISTORY OF MALIGNANT HYPERTHERMIA Anterior Cervical Discectomy Fusion of Cervical five-six, Cervical six-seven   BLADDER SUSPENSION     BREAST SURGERY     left breast mass removed   hand mass     1994, multiple masses removed - "all benign"   leg mass     left leg  surgically removed   POSTERIOR FUSION LUMBAR SPINE     L4/5   TONSILLECTOMY     and adnoids   TUBAL LIGATION       Current Meds  Medication Sig   albuterol (PROVENTIL HFA;VENTOLIN HFA) 108 (90 BASE) MCG/ACT inhaler Inhale  1 puff into the lungs 3 (three) times daily as needed. For shortness of breath   ALPRAZolam (XANAX) 1 MG tablet TAKE 1 TABLET BY MOUTH 3 TIMES DAILY AS NEEDED FOR ANXIETY   amLODipine (NORVASC) 10 MG tablet TAKE 1 TABLET BY MOUTH EVERY DAY   azithromycin (ZITHROMAX) 250 MG tablet Take 2 tablets on day 1, then 1 tablet daily on days 2 through 5   benzonatate (TESSALON) 100 MG capsule Take 100 mg by mouth 3 (three) times daily as needed.   buPROPion (WELLBUTRIN SR) 150 MG 12 hr tablet Take 1 tablet (150 mg total) by mouth 2 (two) times daily.   busPIRone (BUSPAR) 15 MG tablet TAKE 1 TABLET BY MOUTH 2 TIMES A DAY   clotrimazole-betamethasone (LOTRISONE) cream Apply 1 application  topically 2 (two) times daily.   cyclobenzaprine (FLEXERIL) 10 MG tablet Take 1 tablet (10 mg total) by mouth 2 (two) times daily as needed.   DULoxetine (CYMBALTA) 60 MG capsule Take 1 capsule (60 mg total) by mouth 2 (two) times daily.   etodolac (LODINE) 400 MG tablet TAKE 1 TABLET BY MOUTH 2 TIMES A DAY WITH FOOD AS NEEDED FOR PAIN & INFLAMMATION   fenofibrate micronized (LOFIBRA) 134 MG capsule TAKE 1 CAPSULE BY MOUTH DAILY   Fluticasone-Salmeterol (ADVAIR) 250-50 MCG/DOSE AEPB Inhale 1 puff into the lungs every 12 (twelve) hours.   furosemide (LASIX) 20 MG tablet Take 1 tablet (20 mg total) by mouth daily.   gabapentin (NEURONTIN) 300 MG capsule TAKE 3 CAPSULES BY MOUTH 2 TIMES DAILY   lisinopril (ZESTRIL) 20 MG tablet TAKE 1 TABLET BY MOUTH EVERY DAY   omeprazole (PRILOSEC) 40 MG capsule TAKE 1 CAPSULE BY MOUTH DAILY BEFORE MEALS   promethazine-dextromethorphan (PROMETHAZINE-DM) 6.25-15 MG/5ML syrup Take 5 mLs by mouth 4 (four) times daily as needed for cough.   rosuvastatin (CRESTOR) 5 MG tablet TAKE 1 TABLET BY MOUTH EACH DAY FOR CHOLESTEROL   terbinafine (LAMISIL) 250 MG tablet Take 250 mg by mouth daily.     Allergies:   Amoxicillin, Penicillins, Codeine, Oxycodone hcl, and Oxycodone hcl   Social History   Tobacco Use   Smoking status: Every Day    Packs/day: 1.00    Years: 34.00    Pack years: 34.00    Types: Cigarettes   Smokeless tobacco: Never  Vaping Use   Vaping Use: Never used  Substance Use Topics   Alcohol use: Not Currently    Comment: Drinks occasionally, and consumes wine.   Drug use: No     Family Hx: The patient's family history includes Colon cancer in her mother; Stomach cancer in her maternal grandfather; Stroke in her maternal grandmother.  ROS:   Please see the history of present illness.    All other systems reviewed and are negative.  Labs/Other Tests and Data Reviewed:    Recent Labs: 04/19/2020: TSH 2.910 07/13/2020: Hemoglobin 12.6;  Platelets 386 11/22/2020: ALT 11; BUN 15; Creatinine, Ser 0.77; Potassium 5.1; Sodium 140   Recent Lipid Panel Lab Results  Component Value Date/Time   CHOL 138 07/13/2020 10:45 AM   TRIG 78 07/13/2020 10:45 AM   HDL 47 07/13/2020 10:45 AM   CHOLHDL 2.9 07/13/2020 10:45 AM   LDLCALC 76 07/13/2020 10:45 AM    Wt Readings from Last 3 Encounters:  02/28/21 150 lb (68 kg)  11/22/20 160 lb 3.2 oz (72.7 kg)  11/15/20 154 lb 12.8 oz (70.2 kg)     Objective:    Vital Signs:  BP 120/74   Pulse 96   Ht 5' 1.5" (1.562 m)   Wt 150 lb (68 kg)   BMI 27.88 kg/m    VITAL SIGNS:  reviewed GEN:  no acute distress  ASSESSMENT & PLAN:    COVID - recommend quarantine as directed and tylenol, rest , fluids URI/cough - rx for zpack and promethazine DM as directed  COVID-19 Education: The signs and symptoms of COVID-19 were discussed with the patient and how to seek care for testing (follow up with PCP or arrange E-visit). The importance of social distancing was discussed today.  Time:   Today, I have spent 10 minutes with the patient with telehealth technology discussing the above problems.     Medication Adjustments/Labs and Tests Ordered: Current medicines are reviewed at length with the patient today.  Concerns regarding medicines are outlined above.   Tests Ordered: No orders of the defined types were placed in this encounter.   Medication Changes: Meds ordered this encounter  Medications   azithromycin (ZITHROMAX) 250 MG tablet    Sig: Take 2 tablets on day 1, then 1 tablet daily on days 2 through 5    Dispense:  6 tablet    Refill:  0    Order Specific Question:   Supervising Provider    Answer:   Shelton Silvas   promethazine-dextromethorphan (PROMETHAZINE-DM) 6.25-15 MG/5ML syrup    Sig: Take 5 mLs by mouth 4 (four) times daily as needed for cough.    Dispense:  118 mL    Refill:  0    Order Specific Question:   Supervising Provider    AnswerShelton Silvas    Follow Up:  In Person prn  Signed, Webb Silversmith, PA-C  02/28/2021 11:49 AM    [place]

## 2021-03-07 ENCOUNTER — Other Ambulatory Visit: Payer: Self-pay | Admitting: Family Medicine

## 2021-03-07 ENCOUNTER — Other Ambulatory Visit: Payer: Self-pay | Admitting: Physician Assistant

## 2021-03-14 ENCOUNTER — Other Ambulatory Visit: Payer: Self-pay | Admitting: Physician Assistant

## 2021-03-14 NOTE — Telephone Encounter (Signed)
Attempted to call pt. No answer, left Vm for return call.   Harrell Lark 03/14/21 3:31 PM

## 2021-03-16 NOTE — Telephone Encounter (Signed)
Attempted to call pt. No answer, left VM to call back.   Royce Macadamia, [place] 03/16/21 10:43 AM

## 2021-03-17 MED ORDER — ETODOLAC 400 MG PO TABS: ORAL_TABLET | ORAL | 0 refills | Status: DC

## 2021-03-17 MED ORDER — OMEPRAZOLE 40 MG PO CPDR: DELAYED_RELEASE_CAPSULE | ORAL | 0 refills | Status: DC

## 2021-03-17 MED ORDER — FENOFIBRATE MICRONIZED 134 MG PO CAPS: 134.0000 mg | ORAL_CAPSULE | Freq: Every day | ORAL | 0 refills | Status: DC

## 2021-03-17 NOTE — Telephone Encounter (Signed)
Pt returned call. Appointment made for 12/15.   Kaitlyn Diaz 03/17/21 9:39 AM

## 2021-03-17 NOTE — Addendum Note (Signed)
Addended by: Alonna Minium on: 03/17/2021 09:41 AM   Modules accepted: Orders

## 2021-03-23 ENCOUNTER — Ambulatory Visit (INDEPENDENT_AMBULATORY_CARE_PROVIDER_SITE_OTHER): Payer: Medicare Other | Admitting: Physician Assistant

## 2021-03-23 ENCOUNTER — Other Ambulatory Visit: Payer: Self-pay

## 2021-03-23 ENCOUNTER — Encounter: Payer: Self-pay | Admitting: Physician Assistant

## 2021-03-23 VITALS — BP 126/72 | HR 100 | Temp 96.0°F | Ht 61.0 in | Wt 170.4 lb

## 2021-03-23 DIAGNOSIS — I10 Essential (primary) hypertension: Secondary | ICD-10-CM

## 2021-03-23 DIAGNOSIS — Z1231 Encounter for screening mammogram for malignant neoplasm of breast: Secondary | ICD-10-CM

## 2021-03-23 DIAGNOSIS — Z23 Encounter for immunization: Secondary | ICD-10-CM

## 2021-03-23 DIAGNOSIS — F411 Generalized anxiety disorder: Secondary | ICD-10-CM

## 2021-03-23 DIAGNOSIS — E782 Mixed hyperlipidemia: Secondary | ICD-10-CM | POA: Diagnosis not present

## 2021-03-23 DIAGNOSIS — E559 Vitamin D deficiency, unspecified: Secondary | ICD-10-CM | POA: Diagnosis not present

## 2021-03-23 DIAGNOSIS — M5030 Other cervical disc degeneration, unspecified cervical region: Secondary | ICD-10-CM

## 2021-03-23 DIAGNOSIS — Z1211 Encounter for screening for malignant neoplasm of colon: Secondary | ICD-10-CM

## 2021-03-23 MED ORDER — CYCLOBENZAPRINE HCL 10 MG PO TABS: 10.0000 mg | ORAL_TABLET | Freq: Two times a day (BID) | ORAL | 1 refills | Status: DC | PRN

## 2021-03-23 MED ORDER — OMEPRAZOLE 40 MG PO CPDR: DELAYED_RELEASE_CAPSULE | ORAL | 1 refills | Status: DC

## 2021-03-23 NOTE — Progress Notes (Signed)
Established Patient Office Visit  Subjective:  Patient ID: Kaitlyn Diaz, female    DOB: July 15, 1965  Age: 55 y.o. MRN: [id number]  CC:  Chief Complaint  Patient presents with   Hyperlipidemia    HPI Kaitlyn Diaz presents for hypertension     Pt presents for follow up of hypertension.  She is tolerating the medication well without side effects.  Compliance with treatment has been good; she takes her medication as directed, maintains her diet and exercise regimen, and follows up as directed.  She is taking lisinopril 31m qd and norvasc 137mqd    Follow up of generalized anxiety disorder.  pt is doing well on her xanax, cymbalta , buspar and wellbutrin - currently doing well on medication and voices no concerns or problems  Pt presents with hyperlipidemia.  Compliance with treatment has been good; she maintains her low cholesterol diet, follows up as directed, and maintains her exercise regimen.  She denies experiencing any hypercholesterolemia related symptoms.  She is currently taking fenofibrate and crestor 1m36md    Dx with primary generalized (osteo)arthritis; this has been diagnosed only as arthritis with no further determination of type at this time.  This was diagnosed more than 5 years ago.  The discomfort is mildly uncomfortable.  Primary joints affected include hands and fingers.  Pt takes lodine which does help  Pt uses flexeril and gabapentin for chronic low back pain - does follow with neurosurgeon  Pt would like to get flu shot and pneumonia shot today  Past Medical History:  Diagnosis Date   Anemia    Anxiety    "panic attacks, claustophobic on xanax"   Arthritis    Asthma    Complication of anesthesia    Difficult to put to sleep, Has run fever, "runs in famly"   COPD (chronic obstructive pulmonary disease) ([facility]  Enlarged heart    hx of   Essential (primary) hypertension    Family history of anesthesia complication    malignant hyperthermia on  mother's side of family   Generalized anxiety disorder    GERD (gastroesophageal reflux disease)    H/O hiatal hernia    hx of   Headache(784.0)    "due to Blood pressure"   History of melanoma    Hypertension    Dr. TarLucita Loraedical physician [phone] hyperthermia    Mixed hyperlipidemia    Neuromuscular disorder ([facility]  "85 % nerve damage in left leg"   Pneumonia    hx of pneumonia x 2   PONV (postoperative nausea and vomiting)    Spinal headache    Urinary tract infection    and kidney infection Hx of    Past Surgical History:  Procedure Laterality Date   ANTERIOR CERVICAL DECOMP/DISCECTOMY FUSION  12/28/2011   Procedure: ANTERIOR CERVICAL DECOMPRESSION/DISCECTOMY FUSION 2 LEVELS;  Surgeon: GarElaina HoopsD;  Location: [facility];  Service: Neurosurgery;  Laterality: N/A;  HISTORY OF MALIGNANT HYPERTHERMIA Anterior Cervical Discectomy Fusion of Cervical five-six, Cervical six-seven   BLADDER SUSPENSION     BREAST SURGERY     left breast mass removed   hand mass     1994, multiple masses removed - "all benign"   leg mass     left leg  surgically removed   POSTERIOR FUSION LUMBAR SPINE     L4/5   TONSILLECTOMY     and adnoids   TUBAL LIGATION  Family History  Problem Relation Age of Onset   Colon cancer Mother    Stroke Maternal Grandmother    Stomach cancer Maternal Grandfather     Social History   Socioeconomic History   Marital status: Married    Spouse name: Not on file   Number of children: 2   Years of education: Not on file   Highest education level: Not on file  Occupational History   Occupation: Disabled  Tobacco Use   Smoking status: Every Day    Packs/day: 1.00    Years: 34.00    Pack years: 34.00    Types: Cigarettes   Smokeless tobacco: Never  Vaping Use   Vaping Use: Never used  Substance and Sexual Activity   Alcohol use: Not Currently    Comment: Drinks occasionally, and consumes wine.   Drug use: No    Sexual activity: Never  Other Topics Concern   Not on file  Social History Narrative   Not on file   Social Determinants of Health   Financial Resource Strain: Not on file  Food Insecurity: Not on file  Transportation Needs: Not on file  Physical Activity: Not on file  Stress: Not on file  Social Connections: Not on file  Intimate Partner Violence: Not on file     Current Outpatient Medications:    albuterol (PROVENTIL HFA;VENTOLIN HFA) 108 (90 BASE) MCG/ACT inhaler, Inhale 1 puff into the lungs 3 (three) times daily as needed. For shortness of breath, Disp: , Rfl:    ALPRAZolam (XANAX) 1 MG tablet, TAKE 1 TABLET BY MOUTH 3 TIMES DAILY AS NEEDED FOR ANXIETY, Disp: 90 tablet, Rfl: 0   amLODipine (NORVASC) 10 MG tablet, TAKE 1 TABLET BY MOUTH EVERY DAY, Disp: 90 tablet, Rfl: 0   benzonatate (TESSALON) 100 MG capsule, Take 100 mg by mouth 3 (three) times daily as needed., Disp: , Rfl:    buPROPion (WELLBUTRIN SR) 150 MG 12 hr tablet, Take 1 tablet (150 mg total) by mouth 2 (two) times daily., Disp: 60 tablet, Rfl: 1   busPIRone (BUSPAR) 15 MG tablet, TAKE 1 TABLET BY MOUTH 2 TIMES A DAY, Disp: 180 tablet, Rfl: 0   DULoxetine (CYMBALTA) 60 MG capsule, Take 1 capsule (60 mg total) by mouth 2 (two) times daily., Disp: 180 capsule, Rfl: 1   etodolac (LODINE) 400 MG tablet, TAKE 1 TABLET BY MOUTH 2 TIMES A DAY WITH FOOD AS NEEDED FOR PAIN & INFLAMMATION, Disp: 180 tablet, Rfl: 0   fenofibrate micronized (LOFIBRA) 134 MG capsule, Take 1 capsule (134 mg total) by mouth daily., Disp: 90 capsule, Rfl: 0   Fluticasone-Salmeterol (ADVAIR) 250-50 MCG/DOSE AEPB, Inhale 1 puff into the lungs every 12 (twelve) hours., Disp: , Rfl:    furosemide (LASIX) 20 MG tablet, Take 1 tablet (20 mg total) by mouth daily., Disp: 30 tablet, Rfl: 3   gabapentin (NEURONTIN) 300 MG capsule, TAKE 3 CAPSULES BY MOUTH 2 TIMES DAILY, Disp: 180 capsule, Rfl: 2   lisinopril (ZESTRIL) 20 MG tablet, TAKE 1 TABLET BY MOUTH EVERY  DAY, Disp: 90 tablet, Rfl: 0   rosuvastatin (CRESTOR) 5 MG tablet, TAKE 1 TABLET BY MOUTH EACH DAY FOR CHOLESTEROL, Disp: 90 tablet, Rfl: 1   cyclobenzaprine (FLEXERIL) 10 MG tablet, Take 1 tablet (10 mg total) by mouth 2 (two) times daily as needed., Disp: 60 tablet, Rfl: 1   omeprazole (PRILOSEC) 40 MG capsule, TAKE 1 CAPSULE BY MOUTH DAILY BEFORE MEALS, Disp: 90 capsule, Rfl: 1   Allergies  Allergen Reactions   Amoxicillin Anaphylaxis, Itching and Swelling   Penicillins Itching and Swelling    Throat swelling Throat swelling    Codeine Other (See Comments)    nausea   Oxycodone Hcl Other (See Comments)    hallucinations   Oxycodone Hcl    CONSTITUTIONAL: Negative for chills, fatigue, fever, unintentional weight gain and unintentional weight loss.  E/N/T: Negative for ear pain, nasal congestion and sore throat.  CARDIOVASCULAR: Negative for chest pain, dizziness, palpitations and pedal edema.  RESPIRATORY: Negative for recent cough and dyspnea.  GASTROINTESTINAL: Negative for abdominal pain, acid reflux symptoms, constipation, diarrhea, nausea and vomiting.  MSK: Negative for arthralgias and myalgias.  INTEGUMENTARY: Negative for rash.  NEUROLOGICAL: Negative for dizziness and headaches.  PSYCHIATRIC: Negative for sleep disturbance and to question depression screen.  Negative for depression, negative for anhedonia.            Objective:   PHYSICAL EXAM:   VS: BP 126/72 (BP Location: Left Arm, Patient Position: Sitting, Cuff Size: Normal)    Pulse 100    Temp (!) 96 F (35.6 C) (Temporal)    Ht 5' 1"  (1.549 m)    Wt 170 lb 6.4 oz (77.3 kg)    SpO2 97%    BMI 32.20 kg/m   GEN: Well nourished, well developed, in no acute distress  HEENT: normal external ears and nose - normal external auditory canals and TMS -  - Lips, Teeth and Gums - normal  Oropharynx - normal mucosa, palate, and posterior pharynx Cardiac: RRR; no murmurs, rubs, or gallops,no edema -  Respiratory:   normal respiratory rate and pattern with no distress - normal breath sounds with no rales, rhonchi, wheezes or rubs MS: no deformity or atrophy  Skin: warm and dry, no rash  Neuro:  Alert and Oriented x 3, Strength and sensation are intact - CN II-Xii grossly intact Psych: euthymic mood, appropriate affect and demeanor  Health Maintenance Due  Topic Date Due   PAP SMEAR-Modifier  Never done   MAMMOGRAM  02/13/2014   Zoster Vaccines- Shingrix (1 of 2) Never done   COLONOSCOPY (Pts 45-38yr Insurance coverage will need to be confirmed)  12/16/2019    There are no preventive care reminders to display for this patient.  Lab Results  Component Value Date   TSH 2.910 04/19/2020   Lab Results  Component Value Date   WBC 6.2 07/13/2020   HGB 12.6 07/13/2020   HCT 37.6 07/13/2020   MCV 93 07/13/2020   PLT 386 07/13/2020   Lab Results  Component Value Date   NA 140 11/22/2020   K 5.1 11/22/2020   CO2 18 (L) 11/22/2020   GLUCOSE 96 11/22/2020   BUN 15 11/22/2020   CREATININE 0.77 11/22/2020   BILITOT 0.3 11/22/2020   ALKPHOS 68 11/22/2020   AST 22 11/22/2020   ALT 11 11/22/2020   PROT 7.2 11/22/2020   ALBUMIN 4.8 11/22/2020   CALCIUM 9.6 11/22/2020   EGFR 90 11/22/2020   Lab Results  Component Value Date   CHOL 138 07/13/2020   Lab Results  Component Value Date   HDL 47 07/13/2020   Lab Results  Component Value Date   LDLCALC 76 07/13/2020   Lab Results  Component Value Date   TRIG 78 07/13/2020   Lab Results  Component Value Date   CHOLHDL 2.9 07/13/2020   No results found for: HGBA1C    Assessment & Plan:   Problem List Items Addressed This Visit  Cardiovascular and Mediastinum   Benign essential hypertension - Primary   Relevant Orders   CBC with Differential/Platelet   Comprehensive metabolic panel   TSH Continue meds as directed     Musculoskeletal and Integument   DDD (degenerative disc disease), cervical   Relevant Medications    cyclobenzaprine (FLEXERIL) 10 MG tablet     Other   Mixed hyperlipidemia   Relevant Orders   Lipid panel Continue meds and watch diet   Generalized anxiety disorder  Continue meds Other Visit Diagnoses     Vitamin D deficiency       Relevant Orders   VITAMIN D 25 Hydroxy (Vit-D Deficiency, Fractures)   Needs flu shot       Relevant Orders   Flu Vaccine MDCK QUAD PF (Completed)   Need for vaccination for pneumococcus       Relevant Orders   Pneumococcal conjugate vaccine 20-valent (Prevnar 20) (Completed)   Screening for colon cancer       Relevant Orders   Ambulatory referral to Gastroenterology   Visit for screening mammogram       Relevant Orders   MM Digital Screening       Meds ordered this encounter  Medications   omeprazole (PRILOSEC) 40 MG capsule    Sig: TAKE 1 CAPSULE BY MOUTH DAILY BEFORE MEALS    Dispense:  90 capsule    Refill:  1    Order Specific Question:   Supervising Provider    Answer:   Shelton Silvas   cyclobenzaprine (FLEXERIL) 10 MG tablet    Sig: Take 1 tablet (10 mg total) by mouth 2 (two) times daily as needed.    Dispense:  60 tablet    Refill:  1    Order Specific Question:   Supervising Provider    Answer:   Shelton Silvas    Follow-up: Return in about 6 months (around 09/21/2021) for chronic fasting.    SARA R Duchess Armendarez, PA-C

## 2021-03-24 LAB — CBC WITH DIFFERENTIAL/PLATELET
Basophils Absolute: 0.1 10*3/uL (ref 0.0–0.2)
Basos: 1 %
EOS (ABSOLUTE): 0.1 10*3/uL (ref 0.0–0.4)
Eos: 2 %
Hematocrit: 34.5 % (ref 34.0–46.6)
Hemoglobin: 11.7 g/dL (ref 11.1–15.9)
Immature Grans (Abs): 0 10*3/uL (ref 0.0–0.1)
Immature Granulocytes: 1 %
Lymphocytes Absolute: 1.6 10*3/uL (ref 0.7–3.1)
Lymphs: 33 %
MCH: 30.5 pg (ref 26.6–33.0)
MCHC: 33.9 g/dL (ref 31.5–35.7)
MCV: 90 fL (ref 79–97)
Monocytes Absolute: 0.4 10*3/uL (ref 0.1–0.9)
Monocytes: 8 %
Neutrophils Absolute: 2.7 10*3/uL (ref 1.4–7.0)
Neutrophils: 55 %
Platelets: 344 10*3/uL (ref 150–450)
RBC: 3.83 x10E6/uL (ref 3.77–5.28)
RDW: 12.9 % (ref 11.7–15.4)
WBC: 5 10*3/uL (ref 3.4–10.8)

## 2021-03-24 LAB — COMPREHENSIVE METABOLIC PANEL
ALT: 9 IU/L (ref 0–32)
AST: 15 IU/L (ref 0–40)
Albumin/Globulin Ratio: 2.3 — ABNORMAL HIGH (ref 1.2–2.2)
Albumin: 4.9 g/dL (ref 3.8–4.9)
Alkaline Phosphatase: 82 IU/L (ref 44–121)
BUN/Creatinine Ratio: 20 (ref 9–23)
BUN: 14 mg/dL (ref 6–24)
Bilirubin Total: <0.2 mg/dL (ref 0.0–1.2)
CO2: 20 mmol/L (ref 20–29)
Calcium: 9.5 mg/dL (ref 8.7–10.2)
Chloride: 106 mmol/L (ref 96–106)
Creatinine, Ser: 0.7 mg/dL (ref 0.57–1.00)
Globulin, Total: 2.1 g/dL (ref 1.5–4.5)
Glucose: 77 mg/dL (ref 70–99)
Potassium: 4.6 mmol/L (ref 3.5–5.2)
Sodium: 141 mmol/L (ref 134–144)
Total Protein: 7 g/dL (ref 6.0–8.5)
eGFR: 101 mL/min/{1.73_m2} (ref >59)

## 2021-03-24 LAB — LIPID PANEL
Chol/HDL Ratio: 2.9 ratio (ref 0.0–4.4)
Cholesterol, Total: 150 mg/dL (ref 100–199)
HDL: 51 mg/dL (ref >39)
LDL Chol Calc (NIH): 82 mg/dL (ref 0–99)
Triglycerides: 93 mg/dL (ref 0–149)
VLDL Cholesterol Cal: 17 mg/dL (ref 5–40)

## 2021-03-24 LAB — CARDIOVASCULAR RISK ASSESSMENT

## 2021-03-24 LAB — VITAMIN D 25 HYDROXY (VIT D DEFICIENCY, FRACTURES): Vit D, 25-Hydroxy: 32.8 ng/mL (ref 30.0–100.0)

## 2021-03-24 LAB — TSH: TSH: 2.99 u[IU]/mL (ref 0.450–4.500)

## 2021-04-04 ENCOUNTER — Inpatient Hospital Stay: Admission: RE | Admit: 2021-04-04 | Payer: Medicare Other | Source: Ambulatory Visit

## 2021-04-06 ENCOUNTER — Other Ambulatory Visit: Payer: Self-pay | Admitting: Physician Assistant

## 2021-04-10 ENCOUNTER — Other Ambulatory Visit: Payer: Self-pay | Admitting: Physician Assistant

## 2021-04-13 ENCOUNTER — Inpatient Hospital Stay: Admission: RE | Admit: 2021-04-13 | Payer: Medicare Other | Source: Ambulatory Visit

## 2021-05-04 ENCOUNTER — Other Ambulatory Visit: Payer: Self-pay | Admitting: Physician Assistant

## 2021-05-08 ENCOUNTER — Other Ambulatory Visit: Payer: Self-pay | Admitting: Physician Assistant

## 2021-05-26 ENCOUNTER — Other Ambulatory Visit: Payer: Self-pay | Admitting: Physician Assistant

## 2021-06-05 ENCOUNTER — Other Ambulatory Visit: Payer: Self-pay | Admitting: Physician Assistant

## 2021-06-07 ENCOUNTER — Other Ambulatory Visit: Payer: Self-pay | Admitting: Physician Assistant

## 2021-06-09 DIAGNOSIS — M2022 Hallux rigidus, left foot: Secondary | ICD-10-CM

## 2021-06-09 DIAGNOSIS — M21611 Bunion of right foot: Secondary | ICD-10-CM | POA: Insufficient documentation

## 2021-06-09 DIAGNOSIS — M7742 Metatarsalgia, left foot: Secondary | ICD-10-CM | POA: Diagnosis not present

## 2021-06-09 DIAGNOSIS — F17210 Nicotine dependence, cigarettes, uncomplicated: Secondary | ICD-10-CM | POA: Diagnosis not present

## 2021-06-09 DIAGNOSIS — M7741 Metatarsalgia, right foot: Secondary | ICD-10-CM | POA: Insufficient documentation

## 2021-06-09 HISTORY — DX: Hallux rigidus, left foot: M20.22

## 2021-06-09 HISTORY — DX: Bunion of right foot: M21.611

## 2021-06-09 HISTORY — DX: Metatarsalgia, right foot: M77.41

## 2021-06-12 ENCOUNTER — Encounter: Payer: Self-pay | Admitting: Physician Assistant

## 2021-06-12 ENCOUNTER — Ambulatory Visit (INDEPENDENT_AMBULATORY_CARE_PROVIDER_SITE_OTHER): Payer: Medicare Other | Admitting: Physician Assistant

## 2021-06-12 ENCOUNTER — Other Ambulatory Visit: Payer: Self-pay

## 2021-06-12 VITALS — BP 128/82 | HR 88 | Temp 97.6°F | Ht 61.2 in | Wt 170.0 lb

## 2021-06-12 DIAGNOSIS — R600 Localized edema: Secondary | ICD-10-CM | POA: Diagnosis not present

## 2021-06-12 DIAGNOSIS — M25572 Pain in left ankle and joints of left foot: Secondary | ICD-10-CM | POA: Diagnosis not present

## 2021-06-12 DIAGNOSIS — E559 Vitamin D deficiency, unspecified: Secondary | ICD-10-CM

## 2021-06-12 DIAGNOSIS — Z122 Encounter for screening for malignant neoplasm of respiratory organs: Secondary | ICD-10-CM

## 2021-06-12 DIAGNOSIS — N958 Other specified menopausal and perimenopausal disorders: Secondary | ICD-10-CM

## 2021-06-12 DIAGNOSIS — M25571 Pain in right ankle and joints of right foot: Secondary | ICD-10-CM | POA: Diagnosis not present

## 2021-06-12 DIAGNOSIS — Z1231 Encounter for screening mammogram for malignant neoplasm of breast: Secondary | ICD-10-CM | POA: Diagnosis not present

## 2021-06-12 MED ORDER — MELOXICAM 7.5 MG PO TABS: 7.5000 mg | ORAL_TABLET | Freq: Every day | ORAL | 1 refills | Status: DC

## 2021-06-12 MED ORDER — FUROSEMIDE 20 MG PO TABS: 20.0000 mg | ORAL_TABLET | Freq: Every day | ORAL | 3 refills | Status: DC

## 2021-06-12 NOTE — Progress Notes (Signed)
Subjective:  Patient ID: Kaitlyn Diaz, female    DOB: [birth year]  Age: 56 y.o. MRN: [id number]   Chief Complaint  Patient presents with    arthritis    HPI  Pt in today with complaints of arthritis - states she saw Dr Gean Quint for a bunion and he 'scared' her about her current arthritis symptoms in feet.  He told her to change from lodine to meloxicam (unsure why he did not switch) and that she needed to see a rheumatologist (however she is not having any type of rhematological symptoms and has known history of extensive osteoarthritis involving back and several joints) Pt is due for dexa scan which I can order and she does request Vit D to be checked  Pt states she is going to try to decrease her smoking.  I did discuss chest CT for lung cancer screening and pt agreeable to have done Current Outpatient Medications on File Prior to Visit  Medication Sig Dispense Refill   albuterol (PROVENTIL HFA;VENTOLIN HFA) 108 (90 BASE) MCG/ACT inhaler Inhale 1 puff into the lungs 3 (three) times daily as needed. For shortness of breath     ALPRAZolam (XANAX) 1 MG tablet TAKE 1 TABLET BY MOUTH 3 TIMES DAILY AS NEEDED FOR ANXIETY 90 tablet 0   amLODipine (NORVASC) 10 MG tablet TAKE 1 TABLET BY MOUTH EVERY DAY 90 tablet 0   benzonatate (TESSALON) 100 MG capsule TAKE 1 CAPSULE BY MOUTH THREE TIMES A DAY AS NEEDED FOR COUGH 40 capsule 1   buPROPion (WELLBUTRIN SR) 150 MG 12 hr tablet Take 1 tablet (150 mg total) by mouth 2 (two) times daily. 60 tablet 1   busPIRone (BUSPAR) 15 MG tablet TAKE 1 TABLET BY MOUTH 2 TIMES A DAY 180 tablet 0   cyclobenzaprine (FLEXERIL) 10 MG tablet Take 1 tablet (10 mg total) by mouth 2 (two) times daily as needed. 60 tablet 1   DULoxetine (CYMBALTA) 60 MG capsule Take 1 capsule (60 mg total) by mouth 2 (two) times daily. 180 capsule 1   fenofibrate micronized (LOFIBRA) 134 MG capsule Take 1 capsule (134 mg total) by mouth daily. 90 capsule 0   gabapentin (NEURONTIN) 300 MG  capsule TAKE 3 CAPSULES BY MOUTH 2 TIMES DAILY 180 capsule 2   lisinopril (ZESTRIL) 20 MG tablet TAKE 1 TABLET BY MOUTH EVERY DAY 90 tablet 0   omeprazole (PRILOSEC) 40 MG capsule TAKE 1 CAPSULE BY MOUTH DAILY BEFORE MEALS 90 capsule 1   rosuvastatin (CRESTOR) 5 MG tablet TAKE 1 TABLET BY MOUTH EACH DAY FOR CHOLESTEROL 90 tablet 1   No current facility-administered medications on file prior to visit.   Past Medical History:  Diagnosis Date   Anemia    Anxiety    "panic attacks, claustophobic on xanax"   Arthritis    Asthma    Complication of anesthesia    Difficult to put to sleep, Has run fever, "runs in famly"   COPD (chronic obstructive pulmonary disease) ([place])    Enlarged heart    hx of   Essential (primary) hypertension    Family history of anesthesia complication    malignant hyperthermia on mother's side of family   Generalized anxiety disorder    GERD (gastroesophageal reflux disease)    H/O hiatal hernia    hx of   Headache(784.0)    "due to Blood pressure"   History of melanoma    Hypertension    Dr. Lucita Lora, medical physician [phone]   Malignant  hyperthermia    Mixed hyperlipidemia    Neuromuscular disorder (HCC)    "85 % nerve damage in left leg"   Pneumonia    hx of pneumonia x 2   PONV (postoperative nausea and vomiting)    Spinal headache    Urinary tract infection    and kidney infection Hx of   Past Surgical History:  Procedure Laterality Date   ANTERIOR CERVICAL DECOMP/DISCECTOMY FUSION  12/28/2011   Procedure: ANTERIOR CERVICAL DECOMPRESSION/DISCECTOMY FUSION 2 LEVELS;  Surgeon: Elaina Hoops, MD;  Location: [facility];  Service: Neurosurgery;  Laterality: N/A;  HISTORY OF MALIGNANT HYPERTHERMIA Anterior Cervical Discectomy Fusion of Cervical five-six, Cervical six-seven   BLADDER SUSPENSION     BREAST SURGERY     left breast mass removed   hand mass     1994, multiple masses removed - "all benign"   leg mass     left leg  surgically  removed   POSTERIOR FUSION LUMBAR SPINE     L4/5   TONSILLECTOMY     and adnoids   TUBAL LIGATION      Family History  Problem Relation Age of Onset   Colon cancer Mother    Stroke Maternal Grandmother    Stomach cancer Maternal Grandfather    Social History   Socioeconomic History   Marital status: Married    Spouse name: Not on file   Number of children: 2   Years of education: Not on file   Highest education level: Not on file  Occupational History   Occupation: Disabled  Tobacco Use   Smoking status: Every Day    Packs/day: 1.00    Years: 34.00    Pack years: 34.00    Types: Cigarettes   Smokeless tobacco: Never  Vaping Use   Vaping Use: Never used  Substance and Sexual Activity   Alcohol use: Not Currently    Comment: Drinks occasionally, and consumes wine.   Drug use: No   Sexual activity: Never  Other Topics Concern   Not on file  Social History Narrative   Not on file   Social Determinants of Health   Financial Resource Strain: Not on file  Food Insecurity: Not on file  Transportation Needs: Not on file  Physical Activity: Not on file  Stress: Not on file  Social Connections: Not on file    Review of Systems CONSTITUTIONAL: Negative for chills, fatigue, fever, unintentional weight gain and unintentional weight loss.  E/N/T: Negative for ear pain, nasal congestion and sore throat.  CARDIOVASCULAR: Negative for chest pain, dizziness, palpitations and pedal edema.  RESPIRATORY: Negative for recent cough and dyspnea.  GASTROINTESTINAL: Negative for abdominal pain, acid reflux symptoms, constipation, diarrhea, nausea and vomiting.  MSK: joint pains in both feet and hands INTEGUMENTARY: Negative for rash.        Objective:  BP 128/82    Pulse 88    Temp 97.6 F (36.4 C)    SpO2 98%   BP/Weight 06/12/2021 03/23/2021 37/07/8887  Systolic BP 169 450 388  Diastolic BP 82 72 74  Wt. (Lbs) - 170.4 150  BMI - 32.2 27.88    Physical Exam PHYSICAL  EXAM:   VS: BP 128/82    Pulse 88    Temp 97.6 F (36.4 C)    SpO2 98%   GEN: Well nourished, well developed, in no acute distress  Cardiac: RRR; no murmurs, rubs, or gallops,no edema - Respiratory:  normal respiratory rate and pattern with no distress -  normal breath sounds with no rales, rhonchi, wheezes or rubs MS: bunion - right foot --- OA changes of feet/hands Skin: warm and dry, no rash  Psych: euthymic mood, appropriate affect and demeanor  Diabetic Foot Exam - Simple   No data filed      Lab Results  Component Value Date   WBC 5.0 03/23/2021   HGB 11.7 03/23/2021   HCT 34.5 03/23/2021   PLT 344 03/23/2021   GLUCOSE 77 03/23/2021   CHOL 150 03/23/2021   TRIG 93 03/23/2021   HDL 51 03/23/2021   LDLCALC 82 03/23/2021   ALT 9 03/23/2021   AST 15 03/23/2021   NA 141 03/23/2021   K 4.6 03/23/2021   CL 106 03/23/2021   CREATININE 0.70 03/23/2021   BUN 14 03/23/2021   CO2 20 03/23/2021   TSH 2.990 03/23/2021      Assessment & Plan:   Problem List Items Addressed This Visit   None Visit Diagnoses     Pain in joints of both feet    -  Primary   Relevant Medications   meloxicam (MOBIC) 7.5 MG tablet Stop lodine   Other Relevant Orders   CBC with Differential/Platelet   Comprehensive metabolic panel   VITAMIN D 25 Hydroxy (Vit-D Deficiency, Fractures)   ANA w/Reflex   Rheumatoid Arthritis Profile   Vitamin D deficiency       Relevant Orders   VITAMIN D 25 Hydroxy (Vit-D Deficiency, Fractures)   Encounter for screening for lung cancer       Relevant Orders   CT CHEST LUNG CANCER SCREENING LOW DOSE WO CONTRAST   Encounter for screening mammogram for breast cancer       Relevant Orders   MM DIGITAL SCREENING BILATERAL   Other specified menopausal and perimenopausal disorders       Relevant Orders   DG Bone Density   Localized edema       Relevant Medications   furosemide (LASIX) 20 MG tablet     .  Meds ordered this encounter  Medications    furosemide (LASIX) 20 MG tablet    Sig: Take 1 tablet (20 mg total) by mouth daily.    Dispense:  30 tablet    Refill:  3    Order Specific Question:   Supervising Provider    Answer:   Shelton Silvas   meloxicam (MOBIC) 7.5 MG tablet    Sig: Take 1 tablet (7.5 mg total) by mouth daily.    Dispense:  90 tablet    Refill:  1    Order Specific Question:   Supervising Provider    Answer:   Rochel Brome [phone]    Orders Placed This Encounter  Procedures   CT CHEST LUNG CANCER SCREENING LOW DOSE WO CONTRAST   MM DIGITAL SCREENING BILATERAL   DG Bone Density   CBC with Differential/Platelet   Comprehensive metabolic panel   VITAMIN D 25 Hydroxy (Vit-D Deficiency, Fractures)   ANA w/Reflex   Rheumatoid Arthritis Profile     Follow-up: No follow-ups on file.  An After Visit Summary was printed and given to the patient.  Yetta Flock [facility] [phone]

## 2021-06-13 ENCOUNTER — Encounter: Payer: Self-pay | Admitting: Physician Assistant

## 2021-06-13 LAB — CBC WITH DIFFERENTIAL/PLATELET
Basophils Absolute: 0.1 10*3/uL (ref 0.0–0.2)
Basos: 1 %
EOS (ABSOLUTE): 0.1 10*3/uL (ref 0.0–0.4)
Eos: 2 %
Hematocrit: 35.6 % (ref 34.0–46.6)
Hemoglobin: 11.8 g/dL (ref 11.1–15.9)
Immature Grans (Abs): 0.1 10*3/uL (ref 0.0–0.1)
Immature Granulocytes: 1 %
Lymphocytes Absolute: 1.5 10*3/uL (ref 0.7–3.1)
Lymphs: 31 %
MCH: 29.8 pg (ref 26.6–33.0)
MCHC: 33.1 g/dL (ref 31.5–35.7)
MCV: 90 fL (ref 79–97)
Monocytes Absolute: 0.4 10*3/uL (ref 0.1–0.9)
Monocytes: 8 %
Neutrophils Absolute: 2.7 10*3/uL (ref 1.4–7.0)
Neutrophils: 57 %
Platelets: 332 10*3/uL (ref 150–450)
RBC: 3.96 x10E6/uL (ref 3.77–5.28)
RDW: 12.5 % (ref 11.7–15.4)
WBC: 4.8 10*3/uL (ref 3.4–10.8)

## 2021-06-13 LAB — COMPREHENSIVE METABOLIC PANEL
ALT: 10 IU/L (ref 0–32)
AST: 17 IU/L (ref 0–40)
Albumin/Globulin Ratio: 2.1 (ref 1.2–2.2)
Albumin: 4.7 g/dL (ref 3.8–4.9)
Alkaline Phosphatase: 83 IU/L (ref 44–121)
BUN/Creatinine Ratio: 16 (ref 9–23)
BUN: 13 mg/dL (ref 6–24)
Bilirubin Total: <0.2 mg/dL (ref 0.0–1.2)
CO2: 19 mmol/L — ABNORMAL LOW (ref 20–29)
Calcium: 9.4 mg/dL (ref 8.7–10.2)
Chloride: 109 mmol/L — ABNORMAL HIGH (ref 96–106)
Creatinine, Ser: 0.81 mg/dL (ref 0.57–1.00)
Globulin, Total: 2.2 g/dL (ref 1.5–4.5)
Glucose: 92 mg/dL (ref 70–99)
Potassium: 4.7 mmol/L (ref 3.5–5.2)
Sodium: 146 mmol/L — ABNORMAL HIGH (ref 134–144)
Total Protein: 6.9 g/dL (ref 6.0–8.5)
eGFR: 85 mL/min/{1.73_m2} (ref >59)

## 2021-06-13 LAB — RHEUMATOID ARTHRITIS PROFILE
Cyclic Citrullin Peptide Ab: 59 units — ABNORMAL HIGH (ref 0–19)
Rheumatoid fact SerPl-aCnc: 16.2 IU/mL — ABNORMAL HIGH (ref <14.0)

## 2021-06-13 LAB — ANA W/REFLEX: Anti Nuclear Antibody (ANA): NEGATIVE

## 2021-06-13 LAB — VITAMIN D 25 HYDROXY (VIT D DEFICIENCY, FRACTURES): Vit D, 25-Hydroxy: 37.2 ng/mL (ref 30.0–100.0)

## 2021-06-14 ENCOUNTER — Other Ambulatory Visit: Payer: Self-pay | Admitting: Physician Assistant

## 2021-06-14 DIAGNOSIS — M2550 Pain in unspecified joint: Secondary | ICD-10-CM

## 2021-06-14 DIAGNOSIS — M0580 Other rheumatoid arthritis with rheumatoid factor of unspecified site: Secondary | ICD-10-CM

## 2021-06-16 DIAGNOSIS — F17210 Nicotine dependence, cigarettes, uncomplicated: Secondary | ICD-10-CM | POA: Diagnosis not present

## 2021-06-16 DIAGNOSIS — Z87891 Personal history of nicotine dependence: Secondary | ICD-10-CM | POA: Diagnosis not present

## 2021-06-19 ENCOUNTER — Other Ambulatory Visit: Payer: Self-pay | Admitting: Physician Assistant

## 2021-06-27 ENCOUNTER — Other Ambulatory Visit: Payer: Self-pay | Admitting: Physician Assistant

## 2021-07-03 ENCOUNTER — Other Ambulatory Visit: Payer: Self-pay | Admitting: Physician Assistant

## 2021-07-07 ENCOUNTER — Other Ambulatory Visit: Payer: Self-pay | Admitting: Physician Assistant

## 2021-07-19 ENCOUNTER — Ambulatory Visit (INDEPENDENT_AMBULATORY_CARE_PROVIDER_SITE_OTHER): Payer: Medicare Other | Admitting: Physician Assistant

## 2021-07-19 ENCOUNTER — Encounter: Payer: Self-pay | Admitting: Physician Assistant

## 2021-07-19 VITALS — BP 112/78 | HR 73 | Temp 97.5°F | Ht 61.2 in | Wt 179.8 lb

## 2021-07-19 DIAGNOSIS — J438 Other emphysema: Secondary | ICD-10-CM

## 2021-07-19 DIAGNOSIS — J3089 Other allergic rhinitis: Secondary | ICD-10-CM

## 2021-07-19 MED ORDER — FLUTICASONE FUROATE-VILANTEROL 200-25 MCG/ACT IN AEPB: 1.0000 | INHALATION_SPRAY | Freq: Every day | RESPIRATORY_TRACT | 11 refills | Status: DC

## 2021-07-19 MED ORDER — TRIAMCINOLONE ACETONIDE 40 MG/ML IJ SUSP
60.0000 mg | Freq: Once | INTRAMUSCULAR | Status: AC
  Administered 2021-07-19: 60 mg via INTRAMUSCULAR

## 2021-07-19 MED ORDER — ALBUTEROL SULFATE HFA 108 (90 BASE) MCG/ACT IN AERS: 1.0000 | INHALATION_SPRAY | Freq: Three times a day (TID) | RESPIRATORY_TRACT | 3 refills | Status: DC | PRN

## 2021-07-19 NOTE — Progress Notes (Signed)
? ?Subjective:  ?Patient ID: Kaitlyn Diaz, female    DOB: Nov 22, 1965  Age: 56 y.o. MRN: [id number] ? ?Chief Complaint  ?Patient presents with  ? Cough  ? ? ?HPI ? Pt complains of cough and congestion today - has had trouble with allergies - would like kenalog injection ?Also recently on CT she was found to have emphysema - she is a smoker but not ready to quit ?She does use albuterol as needed and interested in trying daily inhaler as well ? ?Pt was found to have aortic atherosclerosis on chest CT as well - she is on current statin treatment ?Current Outpatient Medications on File Prior to Visit  ?Medication Sig Dispense Refill  ? ALPRAZolam (XANAX) 1 MG tablet TAKE 1 TABLET BY MOUTH 3 TIMES DAILY AS NEEDED FOR ANXIETY 90 tablet 0  ? amLODipine (NORVASC) 10 MG tablet TAKE 1 TABLET BY MOUTH EVERY DAY 90 tablet 0  ? benzonatate (TESSALON) 100 MG capsule TAKE 1 CAPSULE BY MOUTH THREE TIMES A DAY AS NEEDED FOR COUGH 40 capsule 1  ? buPROPion (WELLBUTRIN SR) 150 MG 12 hr tablet Take 1 tablet (150 mg total) by mouth 2 (two) times daily. 60 tablet 1  ? busPIRone (BUSPAR) 15 MG tablet TAKE 1 TABLET BY MOUTH 2 TIMES A DAY 180 tablet 0  ? cyclobenzaprine (FLEXERIL) 10 MG tablet Take 1 tablet (10 mg total) by mouth 2 (two) times daily as needed. 60 tablet 1  ? DULoxetine (CYMBALTA) 60 MG capsule Take 1 capsule (60 mg total) by mouth 2 (two) times daily. 180 capsule 1  ? fenofibrate micronized (LOFIBRA) 134 MG capsule TAKE 1 CAPSULE BY MOUTH EACH DAY 90 capsule 0  ? furosemide (LASIX) 20 MG tablet Take 1 tablet (20 mg total) by mouth daily. 30 tablet 3  ? gabapentin (NEURONTIN) 300 MG capsule TAKE 3 CAPSULES BY MOUTH 2 TIMES DAILY 180 capsule 2  ? lisinopril (ZESTRIL) 20 MG tablet TAKE 1 TABLET BY MOUTH EVERY DAY 90 tablet 0  ? meloxicam (MOBIC) 7.5 MG tablet Take 1 tablet (7.5 mg total) by mouth daily. 90 tablet 1  ? omeprazole (PRILOSEC) 40 MG capsule TAKE 1 CAPSULE BY MOUTH DAILY BEFORE MEALS 90 capsule 1  ? rosuvastatin  (CRESTOR) 5 MG tablet TAKE 1 TABLET BY MOUTH EACH DAY FOR CHOLESTEROL 90 tablet 1  ? ?No current facility-administered medications on file prior to visit.  ? ?Past Medical History:  ?Diagnosis Date  ? Anemia   ? Anxiety   ? "panic attacks, claustophobic on xanax"  ? Arthritis   ? Asthma   ? Complication of anesthesia   ? Difficult to put to sleep, Has run fever, "runs in [place]"  ? COPD (chronic obstructive pulmonary disease) ([place])   ? Enlarged heart   ? hx of  ? Essential (primary) hypertension   ? Family history of anesthesia complication   ? malignant hyperthermia on mother's side of family  ? Generalized anxiety disorder   ? GERD (gastroesophageal reflux disease)   ? H/O hiatal hernia   ? hx of  ? Headache(784.0)   ? "due to Blood pressure"  ? History of melanoma   ? Hypertension   ? Dr. Lucita Lora, medical physician [phone]  ? Malignant hyperthermia   ? Mixed hyperlipidemia   ? Neuromuscular disorder ([place])   ? "85 % nerve damage in left leg"  ? Pneumonia   ? hx of pneumonia x 2  ? PONV (postoperative nausea and vomiting)   ?  Spinal headache   ? Urinary tract infection   ? and kidney infection Hx of  ? ?Past Surgical History:  ?Procedure Laterality Date  ? ANTERIOR CERVICAL DECOMP/DISCECTOMY FUSION  12/28/2011  ? Procedure: ANTERIOR CERVICAL DECOMPRESSION/DISCECTOMY FUSION 2 LEVELS;  Surgeon: Elaina Hoops, MD;  Location: [facility];  Service: Neurosurgery;  Laterality: N/A;  HISTORY OF MALIGNANT HYPERTHERMIA ?Anterior Cervical Discectomy Fusion of Cervical five-six, Cervical six-seven  ? BLADDER SUSPENSION    ? BREAST SURGERY    ? left breast mass removed  ? hand mass    ? 1994, multiple masses removed - "all benign"  ? leg mass    ? left leg  surgically removed  ? POSTERIOR FUSION LUMBAR SPINE    ? L4/5  ? TONSILLECTOMY    ? and adnoids  ? TUBAL LIGATION    ?  ?Family History  ?Problem Relation Age of Onset  ? Colon cancer Mother   ? Stroke Maternal Grandmother   ? Stomach cancer Maternal Grandfather    ? ?Social History  ? ?Socioeconomic History  ? Marital status: Married  ?  Spouse name: Not on file  ? Number of children: 2  ? Years of education: Not on file  ? Highest education level: Not on file  ?Occupational History  ? Occupation: Disabled  ?Tobacco Use  ? Smoking status: Every Day  ?  Packs/day: 1.00  ?  Years: 34.00  ?  Pack years: 34.00  ?  Types: Cigarettes  ? Smokeless tobacco: Never  ?Vaping Use  ? Vaping Use: Never used  ?Substance and Sexual Activity  ? Alcohol use: Not Currently  ?  Comment: Drinks occasionally, and consumes wine.  ? Drug use: No  ? Sexual activity: Never  ?Other Topics Concern  ? Not on file  ?Social History Narrative  ? Not on file  ? ?Social Determinants of Health  ? ?Financial Resource Strain: Not on file  ?Food Insecurity: Not on file  ?Transportation Needs: Not on file  ?Physical Activity: Not on file  ?Stress: Not on file  ?Social Connections: Not on file  ? ? ?Review of Systems ? ?CONSTITUTIONAL: Negative for chills, fatigue, fever, unintentional weight gain and unintentional weight loss.  ?E/N/T: see HPI ?CARDIOVASCULAR: Negative for chest pain, dizziness, palpitations and pedal edema.  ?RESPIRATORY: see HPI ?MSK: Negative for arthralgias and myalgias.  ?INTEGUMENTARY: Negative for rash.  ?   ? ?Objective:  ?BP 112/78   Pulse 73   Temp (!) 97.5 ?F (36.4 ?C)   Ht 5' 1.2" (1.554 m)   Wt 179 lb 12.8 oz (81.6 kg)   SpO2 99%   BMI 33.75 kg/m?  ? ? ?  07/19/2021  ?  9:56 AM 06/12/2021  ? 11:41 AM 03/23/2021  ?  9:30 AM  ?BP/Weight  ?Systolic BP 263 785 885  ?Diastolic BP 78 82 72  ?Wt. (Lbs) 179.8 170 170.4  ?BMI 33.75 kg/m2 31.91 kg/m2 32.2 kg/m2  ? ? ?Physical Exam ?PHYSICAL EXAM:  ? ?VS: BP 112/78   Pulse 73   Temp (!) 97.5 ?F (36.4 ?C)   Ht 5' 1.2" (1.554 m)   Wt 179 lb 12.8 oz (81.6 kg)   SpO2 99%   BMI 33.75 kg/m?  ? ?GEN: Well nourished, well developed, in no acute distress  ?HEENT: normal external ears and nose - normal external auditory canals and TMS - - Lips,  Teeth and Gums - normal  ?Oropharynx - normal mucosa, palate, and posterior pharynx ?Cardiac: RRR;  no murmurs, rubs, or gallops,no edema - no significant varicosities ?Respiratory:  scattered rhonchi noted ?Skin: warm and dry, no rash  ?Psych: euthymic mood, appropriate affect and demeanor ? ?Diabetic Foot Exam - Simple   ?No data filed ?  ?  ? ?Lab Results  ?Component Value Date  ? WBC 4.8 06/12/2021  ? HGB 11.8 06/12/2021  ? HCT 35.6 06/12/2021  ? PLT 332 06/12/2021  ? GLUCOSE 92 06/12/2021  ? CHOL 150 03/23/2021  ? TRIG 93 03/23/2021  ? HDL 51 03/23/2021  ? [place] 82 03/23/2021  ? ALT 10 06/12/2021  ? AST 17 06/12/2021  ? NA 146 (H) 06/12/2021  ? K 4.7 06/12/2021  ? CL 109 (H) 06/12/2021  ? CREATININE 0.81 06/12/2021  ? BUN 13 06/12/2021  ? CO2 19 (L) 06/12/2021  ? TSH 2.990 03/23/2021  ? ? ? ? ?Assessment & Plan:  ? ?Problem List Items Addressed This Visit   ?None ?Visit Diagnoses   ? ? Seasonal allergic rhinitis due to other allergic trigger    -  Primary  ? Relevant Medications  ? triamcinolone acetonide (KENALOG-40) injection 60 mg (Completed)  ? Other emphysema ([place])      ? Relevant Medications  ? triamcinolone acetonide (KENALOG-40) injection 60 mg (Completed)  ? albuterol (VENTOLIN HFA) 108 (90 Base) MCG/ACT inhaler  ? fluticasone furoate-vilanterol (BREO ELLIPTA) 200-25 MCG/ACT AEPB  ? ?  ?. ? ?Meds ordered this encounter  ?Medications  ? triamcinolone acetonide (KENALOG-40) injection 60 mg  ? albuterol (VENTOLIN HFA) 108 (90 Base) MCG/ACT inhaler  ?  Sig: Inhale 1 puff into the lungs 3 (three) times daily as needed. For shortness of breath  ?  Dispense:  1 each  ?  Refill:  3  ?  Order Specific Question:   Supervising Provider  ?  AnswerRochel Brome [[id number]]  ? fluticasone furoate-vilanterol (BREO ELLIPTA) 200-25 MCG/ACT AEPB  ?  Sig: Inhale 1 puff into the lungs daily.  ?  Dispense:  1 each  ?  Refill:  11  ?  Order Specific Question:   Supervising Provider  ?  AnswerRochel Brome [[id number]]   ? ? ?No orders of the defined types were placed in this encounter. ?  ? ?Follow-up: Return if symptoms worsen or fail to improve. ? ?An After Visit Summary was printed and given to the patient. ? ?SARA R Anijah Spohr, PA-C ?[facility]

## 2021-07-24 ENCOUNTER — Ambulatory Visit: Payer: Medicare Other | Admitting: Physician Assistant

## 2021-07-31 ENCOUNTER — Other Ambulatory Visit: Payer: Self-pay | Admitting: Family Medicine

## 2021-07-31 ENCOUNTER — Other Ambulatory Visit: Payer: Self-pay | Admitting: Physician Assistant

## 2021-08-23 DIAGNOSIS — M8589 Other specified disorders of bone density and structure, multiple sites: Secondary | ICD-10-CM | POA: Diagnosis not present

## 2021-08-23 DIAGNOSIS — Z1231 Encounter for screening mammogram for malignant neoplasm of breast: Secondary | ICD-10-CM | POA: Diagnosis not present

## 2021-08-23 LAB — MM DIGITAL SCREENING BILATERAL

## 2021-08-24 DIAGNOSIS — N958 Other specified menopausal and perimenopausal disorders: Secondary | ICD-10-CM

## 2021-08-29 ENCOUNTER — Other Ambulatory Visit: Payer: Self-pay | Admitting: Physician Assistant

## 2021-08-29 DIAGNOSIS — M25572 Pain in left ankle and joints of left foot: Secondary | ICD-10-CM

## 2021-08-30 DIAGNOSIS — H9202 Otalgia, left ear: Secondary | ICD-10-CM | POA: Diagnosis not present

## 2021-08-30 DIAGNOSIS — J019 Acute sinusitis, unspecified: Secondary | ICD-10-CM | POA: Diagnosis not present

## 2021-09-01 ENCOUNTER — Encounter: Payer: Self-pay | Admitting: Physician Assistant

## 2021-09-01 ENCOUNTER — Ambulatory Visit (INDEPENDENT_AMBULATORY_CARE_PROVIDER_SITE_OTHER): Payer: Medicare Other | Admitting: Physician Assistant

## 2021-09-01 VITALS — BP 110/70 | HR 97 | Temp 98.2°F | Resp 16 | Ht 61.2 in | Wt 164.0 lb

## 2021-09-01 DIAGNOSIS — E559 Vitamin D deficiency, unspecified: Secondary | ICD-10-CM | POA: Diagnosis not present

## 2021-09-01 DIAGNOSIS — I700 Atherosclerosis of aorta: Secondary | ICD-10-CM

## 2021-09-01 DIAGNOSIS — E782 Mixed hyperlipidemia: Secondary | ICD-10-CM

## 2021-09-01 DIAGNOSIS — J438 Other emphysema: Secondary | ICD-10-CM

## 2021-09-02 LAB — LIPID PANEL
Chol/HDL Ratio: 2.5 ratio (ref 0.0–4.4)
Cholesterol, Total: 131 mg/dL (ref 100–199)
HDL: 52 mg/dL (ref >39)
LDL Chol Calc (NIH): 59 mg/dL (ref 0–99)
Triglycerides: 109 mg/dL (ref 0–149)
VLDL Cholesterol Cal: 20 mg/dL (ref 5–40)

## 2021-09-02 LAB — COMPREHENSIVE METABOLIC PANEL
ALT: 12 IU/L (ref 0–32)
AST: 15 IU/L (ref 0–40)
Albumin/Globulin Ratio: 2 (ref 1.2–2.2)
Albumin: 4.9 g/dL (ref 3.8–4.9)
Alkaline Phosphatase: 67 IU/L (ref 44–121)
BUN/Creatinine Ratio: 16 (ref 9–23)
BUN: 14 mg/dL (ref 6–24)
Bilirubin Total: <0.2 mg/dL (ref 0.0–1.2)
CO2: 20 mmol/L (ref 20–29)
Calcium: 10.2 mg/dL (ref 8.7–10.2)
Chloride: 102 mmol/L (ref 96–106)
Creatinine, Ser: 0.88 mg/dL (ref 0.57–1.00)
Globulin, Total: 2.5 g/dL (ref 1.5–4.5)
Glucose: 113 mg/dL — ABNORMAL HIGH (ref 70–99)
Potassium: 4.3 mmol/L (ref 3.5–5.2)
Sodium: 140 mmol/L (ref 134–144)
Total Protein: 7.4 g/dL (ref 6.0–8.5)
eGFR: 77 mL/min/{1.73_m2} (ref >59)

## 2021-09-02 LAB — CARDIOVASCULAR RISK ASSESSMENT

## 2021-09-02 LAB — VITAMIN D 25 HYDROXY (VIT D DEFICIENCY, FRACTURES): Vit D, 25-Hydroxy: 46.1 ng/mL (ref 30.0–100.0)

## 2021-09-03 ENCOUNTER — Encounter: Payer: Self-pay | Admitting: Physician Assistant

## 2021-09-03 DIAGNOSIS — J438 Other emphysema: Secondary | ICD-10-CM

## 2021-09-03 DIAGNOSIS — E559 Vitamin D deficiency, unspecified: Secondary | ICD-10-CM | POA: Insufficient documentation

## 2021-09-03 DIAGNOSIS — I700 Atherosclerosis of aorta: Secondary | ICD-10-CM | POA: Insufficient documentation

## 2021-09-03 HISTORY — DX: Other emphysema: J43.8

## 2021-09-03 HISTORY — DX: Atherosclerosis of aorta: I70.0

## 2021-09-03 HISTORY — DX: Vitamin D deficiency, unspecified: E55.9

## 2021-09-03 NOTE — Progress Notes (Signed)
Subjective:  Patient ID: Kaitlyn Diaz, female    DOB: 17-Apr-1965  Age: 56 y.o. MRN: [id number]  Chief Complaint  Patient presents with   Discuss results    DEXA, screening mammogram and CT lung screening    HPI  Pt wanted to discuss CT lung screening - it was normal other than showing empysema (pt is currently on  inhalers and breathing stable),  and aortic atherosclerosis (pt is on Crestor) Pt is due for labwork and will get lipid panel done  Pt with history of osteopenia - she is taking daily vitamin D supplements and calcium - would like Vit D level checked (has been low in past) Current Outpatient Medications on File Prior to Visit  Medication Sig Dispense Refill   albuterol (VENTOLIN HFA) 108 (90 Base) MCG/ACT inhaler Inhale 1 puff into the lungs 3 (three) times daily as needed. For shortness of breath 1 each 3   ALPRAZolam (XANAX) 1 MG tablet TAKE 1 TABLET BY MOUTH 3 TIMES DAILY AS NEEDED FOR ANXIETY 90 tablet 0   amLODipine (NORVASC) 10 MG tablet TAKE 1 TABLET BY MOUTH EVERY DAY 90 tablet 0   azelastine (ASTELIN) 0.1 % nasal spray Place into both nostrils.     benzonatate (TESSALON) 100 MG capsule TAKE 1 CAPSULE BY MOUTH THREE TIMES A DAY AS NEEDED FOR COUGH 40 capsule 1   buPROPion (WELLBUTRIN SR) 150 MG 12 hr tablet Take 1 tablet (150 mg total) by mouth 2 (two) times daily. 60 tablet 1   busPIRone (BUSPAR) 15 MG tablet TAKE 1 TABLET BY MOUTH 2 TIMES A DAY 180 tablet 0   cyclobenzaprine (FLEXERIL) 10 MG tablet TAKE 1 TABLET BY MOUTH 2 TIMES A DAY AS NEEDED 60 tablet 1   DULoxetine (CYMBALTA) 60 MG capsule Take 1 capsule (60 mg total) by mouth 2 (two) times daily. 180 capsule 1   fenofibrate micronized (LOFIBRA) 134 MG capsule TAKE 1 CAPSULE BY MOUTH EACH DAY 90 capsule 0   fluticasone furoate-vilanterol (BREO ELLIPTA) 200-25 MCG/ACT AEPB Inhale 1 puff into the lungs daily. 1 each 11   furosemide (LASIX) 20 MG tablet Take 1 tablet (20 mg total) by mouth daily. 30 tablet 3    gabapentin (NEURONTIN) 300 MG capsule TAKE 3 CAPSULES BY MOUTH 2 TIMES DAILY 180 capsule 2   lisinopril (ZESTRIL) 20 MG tablet TAKE 1 TABLET BY MOUTH EVERY DAY 90 tablet 0   meloxicam (MOBIC) 7.5 MG tablet TAKE 1 TABLET BY MOUTH EVERY DAY 90 tablet 1   MOUNJARO 2.5 MG/0.5ML Pen SMARTSIG:2.5 Milligram(s) SUB-Q Once a Week     MOUNJARO 5 MG/0.5ML Pen SMARTSIG:5 Milligram(s) SUB-Q Once a Week     omeprazole (PRILOSEC) 40 MG capsule TAKE 1 CAPSULE BY MOUTH DAILY BEFORE MEALS 90 capsule 1   predniSONE (DELTASONE) 20 MG tablet Take 40 mg by mouth daily.     rosuvastatin (CRESTOR) 5 MG tablet TAKE 1 TABLET BY MOUTH EACH DAY FOR CHOLESTEROL 90 tablet 1   No current facility-administered medications on file prior to visit.   Past Medical History:  Diagnosis Date   Anemia    Anxiety    "panic attacks, claustophobic on xanax"   Arthritis    Asthma    Complication of anesthesia    Difficult to put to sleep, Has run fever, "runs in famly"   COPD (chronic obstructive pulmonary disease) ([place])    Enlarged heart    hx of   Essential (primary) hypertension    Family history  of anesthesia complication    malignant hyperthermia on mother's side of family   Generalized anxiety disorder    GERD (gastroesophageal reflux disease)    H/O hiatal hernia    hx of   Headache(784.0)    "due to Blood pressure"   History of melanoma    Hypertension    Dr. Lucita Lora, medical physician [phone]   Malignant hyperthermia    Mixed hyperlipidemia    Neuromuscular disorder ([place])    "85 % nerve damage in left leg"   Pneumonia    hx of pneumonia x 2   PONV (postoperative nausea and vomiting)    Spinal headache    Urinary tract infection    and kidney infection Hx of   Past Surgical History:  Procedure Laterality Date   ANTERIOR CERVICAL DECOMP/DISCECTOMY FUSION  12/28/2011   Procedure: ANTERIOR CERVICAL DECOMPRESSION/DISCECTOMY FUSION 2 LEVELS;  Surgeon: Elaina Hoops, MD;  Location: [facility];   Service: Neurosurgery;  Laterality: N/A;  HISTORY OF MALIGNANT HYPERTHERMIA Anterior Cervical Discectomy Fusion of Cervical five-six, Cervical six-seven   BLADDER SUSPENSION     BREAST SURGERY     left breast mass removed   hand mass     1994, multiple masses removed - "all benign"   leg mass     left leg  surgically removed   POSTERIOR FUSION LUMBAR SPINE     L4/5   TONSILLECTOMY     and adnoids   TUBAL LIGATION      Family History  Problem Relation Age of Onset   Colon cancer Mother    Stroke Maternal Grandmother    Stomach cancer Maternal Grandfather    Social History   Socioeconomic History   Marital status: Married    Spouse name: Not on file   Number of children: 2   Years of education: Not on file   Highest education level: Not on file  Occupational History   Occupation: Disabled  Tobacco Use   Smoking status: Every Day    Packs/day: 0.75    Years: 34.00    Pack years: 25.50    Types: Cigarettes   Smokeless tobacco: Never  Vaping Use   Vaping Use: Never used  Substance and Sexual Activity   Alcohol use: Not Currently    Comment: Drinks occasionally, and consumes wine.   Drug use: No   Sexual activity: Never  Other Topics Concern   Not on file  Social History Narrative   Not on file   Social Determinants of Health   Financial Resource Strain: Not on file  Food Insecurity: Not on file  Transportation Needs: Not on file  Physical Activity: Not on file  Stress: Not on file  Social Connections: Not on file    Review of Systems CONSTITUTIONAL: Negative for chills, fatigue, fever, unintentional weight gain and unintentional weight loss.  E/N/T: Negative for ear pain, nasal congestion and sore throat.  CARDIOVASCULAR: Negative for chest pain, dizziness, palpitations and pedal edema.  RESPIRATORY: Negative for recent cough and dyspnea.  GASTROINTESTINAL: Negative for abdominal pain, acid reflux symptoms, constipation, diarrhea, nausea and vomiting.      Objective:  PHYSICAL EXAM:   VS: BP 110/70   Pulse 97   Temp 98.2 F (36.8 C)   Resp 16   Ht 5' 1.2" (1.554 m)   Wt 164 lb (74.4 kg)   LMP 04/10/2011 (Approximate)   SpO2 98%   BMI 30.79 kg/m   GEN: Well nourished, well developed, in no  acute distress   Cardiac: RRR; no murmurs, rubs, or gallops,no edema -  Respiratory:  normal respiratory rate and pattern with no distress - normal breath sounds with no rales, rhonchi, wheezes or rubs Psych: euthymic mood, appropriate affect and demeanor   Lab Results  Component Value Date   WBC 4.8 06/12/2021   HGB 11.8 06/12/2021   HCT 35.6 06/12/2021   PLT 332 06/12/2021   GLUCOSE 113 (H) 09/01/2021   CHOL 131 09/01/2021   TRIG 109 09/01/2021   HDL 52 09/01/2021   LDLCALC 59 09/01/2021   ALT 12 09/01/2021   AST 15 09/01/2021   NA 140 09/01/2021   K 4.3 09/01/2021   CL 102 09/01/2021   CREATININE 0.88 09/01/2021   BUN 14 09/01/2021   CO2 20 09/01/2021   TSH 2.990 03/23/2021      Assessment & Plan:   Problem List Items Addressed This Visit       Cardiovascular and Mediastinum   Aortic atherosclerosis ([place]) Watch diet Continue crestor     Respiratory   Other emphysema ([place])   Relevant Medications   Continue inhalers        Other   Mixed hyperlipidemia - Primary   Relevant Orders   Comprehensive metabolic panel (Completed)   Lipid panel (Completed)   Vitamin D deficiency   Relevant Orders   VITAMIN D 25 Hydroxy (Vit-D Deficiency, Fractures) (Completed)  .  No orders of the defined types were placed in this encounter.   Orders Placed This Encounter  Procedures   Comprehensive metabolic panel   Lipid panel   VITAMIN D 25 Hydroxy (Vit-D Deficiency, Fractures)   Cardiovascular Risk Assessment     Follow-up: No follow-ups on file.  An After Visit Summary was printed and given to the patient.  Yetta Flock [facility] [phone]

## 2021-09-06 ENCOUNTER — Ambulatory Visit: Payer: Medicare Other | Admitting: Physician Assistant

## 2021-09-06 ENCOUNTER — Other Ambulatory Visit: Payer: Self-pay | Admitting: Physician Assistant

## 2021-09-07 ENCOUNTER — Other Ambulatory Visit: Payer: Self-pay | Admitting: Physician Assistant

## 2021-09-14 ENCOUNTER — Other Ambulatory Visit: Payer: Self-pay

## 2021-09-14 DIAGNOSIS — Z1231 Encounter for screening mammogram for malignant neoplasm of breast: Secondary | ICD-10-CM

## 2021-09-19 ENCOUNTER — Other Ambulatory Visit: Payer: Self-pay | Admitting: Physician Assistant

## 2021-09-20 ENCOUNTER — Other Ambulatory Visit: Payer: Self-pay

## 2021-09-20 ENCOUNTER — Telehealth: Payer: Self-pay

## 2021-09-20 NOTE — Telephone Encounter (Signed)
Information sent to patient via mychart

## 2021-09-20 NOTE — Telephone Encounter (Signed)
Patient called requesting info on many calories/carbs she can eat daily. Asked that she be referred to a dietician.

## 2021-09-22 ENCOUNTER — Other Ambulatory Visit: Payer: Self-pay | Admitting: Family Medicine

## 2021-09-22 ENCOUNTER — Other Ambulatory Visit: Payer: Self-pay | Admitting: Physician Assistant

## 2021-09-22 DIAGNOSIS — F411 Generalized anxiety disorder: Secondary | ICD-10-CM

## 2021-10-13 ENCOUNTER — Telehealth: Payer: Self-pay

## 2021-10-13 DIAGNOSIS — S60812A Abrasion of left wrist, initial encounter: Secondary | ICD-10-CM | POA: Diagnosis not present

## 2021-10-13 DIAGNOSIS — S80812A Abrasion, left lower leg, initial encounter: Secondary | ICD-10-CM | POA: Diagnosis not present

## 2021-10-13 NOTE — Telephone Encounter (Signed)
Patient calling as she fell last Thursday. She was scrapes on left leg and arm. She travlled to [facility] after the fall and felt the salt water may help the wounds. However wounds are oozing and she feels it needs to evaluated.   Recommended patient go to urgent care due to oozing of wound so they can evaluate and treat as needed. She VU.   Royce Macadamia, [place] 10/13/21 11:38 AM

## 2021-10-17 ENCOUNTER — Other Ambulatory Visit: Payer: Self-pay | Admitting: Family Medicine

## 2021-11-28 ENCOUNTER — Other Ambulatory Visit: Payer: Self-pay | Admitting: Family Medicine

## 2021-11-28 ENCOUNTER — Other Ambulatory Visit: Payer: Self-pay | Admitting: Physician Assistant

## 2021-12-07 ENCOUNTER — Other Ambulatory Visit: Payer: Self-pay | Admitting: Physician Assistant

## 2021-12-07 NOTE — Telephone Encounter (Signed)
Called patient left message for patient  call office back.

## 2021-12-07 NOTE — Telephone Encounter (Signed)
Please call patient - states she is seeing another family medicine provider on 12/19/21 - did she transfer?

## 2021-12-14 ENCOUNTER — Other Ambulatory Visit: Payer: Self-pay | Admitting: Physician Assistant

## 2021-12-19 ENCOUNTER — Ambulatory Visit: Payer: Medicare Other | Admitting: Family Medicine

## 2021-12-19 ENCOUNTER — Telehealth: Payer: Self-pay | Admitting: Family Medicine

## 2021-12-19 ENCOUNTER — Encounter: Payer: Self-pay | Admitting: Physician Assistant

## 2021-12-19 ENCOUNTER — Ambulatory Visit (INDEPENDENT_AMBULATORY_CARE_PROVIDER_SITE_OTHER): Payer: Medicare Other | Admitting: Physician Assistant

## 2021-12-19 VITALS — BP 110/72 | HR 85 | Temp 97.1°F | Ht 61.25 in | Wt 178.0 lb

## 2021-12-19 DIAGNOSIS — M5137 Other intervertebral disc degeneration, lumbosacral region: Secondary | ICD-10-CM

## 2021-12-19 DIAGNOSIS — Z23 Encounter for immunization: Secondary | ICD-10-CM | POA: Diagnosis not present

## 2021-12-19 DIAGNOSIS — R5383 Other fatigue: Secondary | ICD-10-CM

## 2021-12-19 DIAGNOSIS — E782 Mixed hyperlipidemia: Secondary | ICD-10-CM | POA: Diagnosis not present

## 2021-12-19 DIAGNOSIS — F411 Generalized anxiety disorder: Secondary | ICD-10-CM

## 2021-12-19 DIAGNOSIS — D229 Melanocytic nevi, unspecified: Secondary | ICD-10-CM

## 2021-12-19 DIAGNOSIS — I700 Atherosclerosis of aorta: Secondary | ICD-10-CM

## 2021-12-19 DIAGNOSIS — J438 Other emphysema: Secondary | ICD-10-CM

## 2021-12-19 DIAGNOSIS — R739 Hyperglycemia, unspecified: Secondary | ICD-10-CM

## 2021-12-19 DIAGNOSIS — E559 Vitamin D deficiency, unspecified: Secondary | ICD-10-CM | POA: Diagnosis not present

## 2021-12-19 MED ORDER — ALPRAZOLAM 1 MG PO TABS: 1.0000 mg | ORAL_TABLET | Freq: Three times a day (TID) | ORAL | 0 refills | Status: DC | PRN

## 2021-12-19 MED ORDER — TIZANIDINE HCL 4 MG PO CAPS: 4.0000 mg | ORAL_CAPSULE | Freq: Two times a day (BID) | ORAL | 2 refills | Status: DC | PRN

## 2021-12-19 MED ORDER — FENOFIBRATE MICRONIZED 134 MG PO CAPS: ORAL_CAPSULE | ORAL | 1 refills | Status: DC

## 2021-12-19 MED ORDER — PROMETHAZINE-DM 6.25-15 MG/5ML PO SYRP: 5.0000 mL | ORAL_SOLUTION | Freq: Four times a day (QID) | ORAL | 0 refills | Status: DC | PRN

## 2021-12-19 NOTE — Progress Notes (Signed)
Subjective:  Patient ID: Kaitlyn Diaz, female    DOB: [birth year]  Age: 56 y.o. MRN: [id number]  Chief Complaint  Patient presents with   Warts    On right leg.    HPI  Pt initially in today with complaints of lesion noted on right calf - says has been there awhile and getting slightly larger - was thinking it was a wart and could freeze off but actually is not  Pt states that flexeril is not seeming to help with her chronic back pain anymore and would like to switch to another muscle relaxant to see if helpful  Pt requested refll of lofibra and cough medication (that she keeps on hand for her chronic allergies)  Pt has chronic fasting follow up within the next several weeks  Pt requested flu shot today Current Outpatient Medications on File Prior to Visit  Medication Sig Dispense Refill   albuterol (VENTOLIN HFA) 108 (90 Base) MCG/ACT inhaler Inhale 1 puff into the lungs 3 (three) times daily as needed. For shortness of breath 1 each 3   amLODipine (NORVASC) 10 MG tablet TAKE 1 TABLET BY MOUTH EVERY DAY 90 tablet 0   azelastine (ASTELIN) 0.1 % nasal spray Place into both nostrils.     benzonatate (TESSALON) 100 MG capsule TAKE 1 CAPSULE BY MOUTH THREE TIMES A DAY AS NEEDED FOR COUGH 40 capsule 1   buPROPion (WELLBUTRIN SR) 150 MG 12 hr tablet Take 1 tablet (150 mg total) by mouth 2 (two) times daily. 60 tablet 1   busPIRone (BUSPAR) 15 MG tablet TAKE 1 TABLET BY MOUTH 2 TIMES A DAY 180 tablet 0   DULoxetine (CYMBALTA) 60 MG capsule TAKE 1 CAPSULE BY MOUTH 2 TIMES DAILY 180 capsule 1   fluticasone furoate-vilanterol (BREO ELLIPTA) 200-25 MCG/ACT AEPB Inhale 1 puff into the lungs daily. 1 each 11   furosemide (LASIX) 20 MG tablet Take 1 tablet (20 mg total) by mouth daily. 30 tablet 3   gabapentin (NEURONTIN) 300 MG capsule TAKE 3 CAPSULES BY MOUTH 2 TIMES DAILY 180 capsule 2   lisinopril (ZESTRIL) 20 MG tablet TAKE 1 TABLET BY MOUTH EVERY DAY 90 tablet 0   omeprazole (PRILOSEC) 40  MG capsule TAKE 1 CAPSULE BY MOUTH DAILY BEFORE A MEAL 90 capsule 1   rosuvastatin (CRESTOR) 5 MG tablet TAKE 1 TABLET BY MOUTH EACH DAY FOR CHOLESTEROL 90 tablet 1   No current facility-administered medications on file prior to visit.   Past Medical History:  Diagnosis Date   Anemia    Anxiety    "panic attacks, claustophobic on xanax"   Arthritis    Asthma    Complication of anesthesia    Difficult to put to sleep, Has run fever, "runs in famly"   COPD (chronic obstructive pulmonary disease) ([place])    Enlarged heart    hx of   Essential (primary) hypertension    Family history of anesthesia complication    malignant hyperthermia on mother's side of family   Generalized anxiety disorder    GERD (gastroesophageal reflux disease)    H/O hiatal hernia    hx of   Headache(784.0)    "due to Blood pressure"   History of melanoma    Hypertension    Dr. Lucita Lora, medical physician [phone]   Malignant hyperthermia    Mixed hyperlipidemia    Neuromuscular disorder ([place])    "85 % nerve damage in left leg"   Pneumonia    hx of pneumonia  x 2   PONV (postoperative nausea and vomiting)    Spinal headache    Urinary tract infection    and kidney infection Hx of   Past Surgical History:  Procedure Laterality Date   ANTERIOR CERVICAL DECOMP/DISCECTOMY FUSION  12/28/2011   Procedure: ANTERIOR CERVICAL DECOMPRESSION/DISCECTOMY FUSION 2 LEVELS;  Surgeon: Elaina Hoops, MD;  Location: [place] [facility];  Service: Neurosurgery;  Laterality: N/A;  HISTORY OF MALIGNANT HYPERTHERMIA Anterior Cervical Discectomy Fusion of Cervical five-six, Cervical six-seven   BLADDER SUSPENSION     BREAST SURGERY     left breast mass removed   hand mass     1994, multiple masses removed - "all benign"   leg mass     left leg  surgically removed   POSTERIOR FUSION LUMBAR SPINE     L4/5   TONSILLECTOMY     and adnoids   TUBAL LIGATION      Family History  Problem Relation Age of Onset   Colon cancer  Mother    Stroke Maternal Grandmother    Stomach cancer Maternal Grandfather    Social History   Socioeconomic History   Marital status: Married    Spouse name: Not on file   Number of children: 2   Years of education: Not on file   Highest education level: Not on file  Occupational History   Occupation: Disabled  Tobacco Use   Smoking status: Every Day    Packs/day: 0.75    Years: 34.00    Total pack years: 25.50    Types: Cigarettes   Smokeless tobacco: Never  Vaping Use   Vaping Use: Never used  Substance and Sexual Activity   Alcohol use: Not Currently    Comment: Drinks occasionally, and consumes wine.   Drug use: No   Sexual activity: Never  Other Topics Concern   Not on file  Social History Narrative   Not on file   Social Determinants of Health   Financial Resource Strain: Not on file  Food Insecurity: Not on file  Transportation Needs: Not on file  Physical Activity: Not on file  Stress: Not on file  Social Connections: Not on file    Review of Systems CONSTITUTIONAL: Negative for chills, fatigue, fever, E/N/T: Negative for ear pain, nasal congestion and sore throat.  CARDIOVASCULAR: Negative for chest pain, dizziness, palpitations and pedal edema.  RESPIRATORY: Negative for recent cough and dyspnea.  GASTROINTESTINAL: Negative for abdominal pain, acid reflux symptoms, constipation, diarrhea, nausea and vomiting.  MSK: see HPI INTEGUMENTARY: see HPI PSYCHIATRIC: Negative for sleep disturbance and to question depression screen.  Negative for depression, negative for anhedonia.       Objective:  .PHYSICAL EXAM:   VS: BP 110/72 (BP Location: Left Arm, Patient Position: Sitting, Cuff Size: Normal)   Pulse 85   Temp (!) 97.1 F (36.2 C) (Temporal)   Ht 5' 1.25" (1.556 m)   Wt 178 lb (80.7 kg)   SpO2 99%   BMI 33.36 kg/m   GEN: Well nourished, well developed, in no acute distress  Cardiac: RRR; no murmurs, rubs, or gallops,no edema -   Respiratory:  normal respiratory rate and pattern with no distress - normal breath sounds with no rales, rhonchi, wheezes or rubs  Skin: raised atypical lesion on lower leg  Psych: euthymic mood, appropriate affect and demeanor  Lab Results  Component Value Date   WBC 4.8 06/12/2021   HGB 11.8 06/12/2021   HCT 35.6 06/12/2021   PLT 332  06/12/2021   GLUCOSE 113 (H) 09/01/2021   CHOL 131 09/01/2021   TRIG 109 09/01/2021   HDL 52 09/01/2021   LDLCALC 59 09/01/2021   ALT 12 09/01/2021   AST 15 09/01/2021   NA 140 09/01/2021   K 4.3 09/01/2021   CL 102 09/01/2021   CREATININE 0.88 09/01/2021   BUN 14 09/01/2021   CO2 20 09/01/2021   TSH 2.990 03/23/2021      Assessment & Plan:   Problem List Items Addressed This Visit       Cardiovascular and Mediastinum   Aortic atherosclerosis (HCC)   Relevant Medications   fenofibrate micronized (LOFIBRA) 134 MG capsule     Respiratory   Other emphysema (HCC)   Relevant Medications   promethazine-dextromethorphan (PROMETHAZINE-DM) 6.25-15 MG/5ML syrup     Musculoskeletal and Integument   DDD (degenerative disc disease), lumbosacral   Relevant Medications   tiZANidine (ZANAFLEX) 4 MG capsule     Other   Mixed hyperlipidemia   Relevant Medications   fenofibrate micronized (LOFIBRA) 134 MG capsule                  Generalized anxiety disorder   Relevant Medications   ALPRAZolam (XANAX) 1 MG tablet            Other Visit Diagnoses     Needs flu shot    -  Primary   Relevant Orders   Flu Vaccine MDCK QUAD PF (Completed)                           Atypical nevus       Relevant Orders   Ambulatory referral to Dermatology     .  Meds ordered this encounter  Medications   promethazine-dextromethorphan (PROMETHAZINE-DM) 6.25-15 MG/5ML syrup    Sig: Take 5 mLs by mouth 4 (four) times daily as needed.    Dispense:  118 mL    Refill:  0    Order Specific Question:   Supervising Provider    Answer:   Shelton Silvas   fenofibrate micronized (LOFIBRA) 134 MG capsule    Sig: TAKE 1 CAPSULE BY MOUTH EACH DAY    Dispense:  90 capsule    Refill:  1    Order Specific Question:   Supervising Provider    Answer:   Shelton Silvas   tiZANidine (ZANAFLEX) 4 MG capsule    Sig: Take 1 capsule (4 mg total) by mouth 2 (two) times daily as needed for muscle spasms.    Dispense:  60 capsule    Refill:  2    Order Specific Question:   Supervising Provider    Answer:   COX, Lynder Parents   ALPRAZolam (XANAX) 1 MG tablet    Sig: Take 1 tablet (1 mg total) by mouth 3 (three) times daily as needed for anxiety.    Dispense:  90 tablet    Refill:  0    Order Specific Question:   Supervising Provider    AnswerShelton Silvas    Orders Placed This Encounter  Procedures   Flu Vaccine MDCK QUAD PF   CBC with Differential/Platelet   Comprehensive metabolic panel   TSH   Lipid panel   VITAMIN D 25 Hydroxy (Vit-D Deficiency, Fractures)   Hemoglobin A1c   Ambulatory referral to Dermatology     Follow-up: Return in about 3 months (around 03/20/2022) for chronic fasting follow up.  An After Visit Summary was printed and given to the patient.  Yetta Flock [facility] [phone]

## 2021-12-19 NOTE — Telephone Encounter (Signed)
No answer when called for AWV via telephone this afternoon. Left message to call office to reschedule.

## 2021-12-20 ENCOUNTER — Other Ambulatory Visit: Payer: Self-pay | Admitting: Physician Assistant

## 2021-12-20 DIAGNOSIS — R899 Unspecified abnormal finding in specimens from other organs, systems and tissues: Secondary | ICD-10-CM

## 2021-12-20 DIAGNOSIS — R5383 Other fatigue: Secondary | ICD-10-CM

## 2021-12-20 LAB — COMPREHENSIVE METABOLIC PANEL
ALT: 8 IU/L (ref 0–32)
AST: 15 IU/L (ref 0–40)
Albumin/Globulin Ratio: 2 (ref 1.2–2.2)
Albumin: 4.5 g/dL (ref 3.8–4.9)
Alkaline Phosphatase: 98 IU/L (ref 44–121)
BUN/Creatinine Ratio: 18 (ref 9–23)
BUN: 12 mg/dL (ref 6–24)
Bilirubin Total: <0.2 mg/dL (ref 0.0–1.2)
CO2: 23 mmol/L (ref 20–29)
Calcium: 9.4 mg/dL (ref 8.7–10.2)
Chloride: 105 mmol/L (ref 96–106)
Creatinine, Ser: 0.65 mg/dL (ref 0.57–1.00)
Globulin, Total: 2.2 g/dL (ref 1.5–4.5)
Glucose: 102 mg/dL — ABNORMAL HIGH (ref 70–99)
Potassium: 4.7 mmol/L (ref 3.5–5.2)
Sodium: 144 mmol/L (ref 134–144)
Total Protein: 6.7 g/dL (ref 6.0–8.5)
eGFR: 103 mL/min/{1.73_m2} (ref >59)

## 2021-12-20 LAB — HEMOGLOBIN A1C
Est. average glucose Bld gHb Est-mCnc: 105 mg/dL
Hgb A1c MFr Bld: 5.3 % (ref 4.8–5.6)

## 2021-12-20 LAB — LIPID PANEL
Chol/HDL Ratio: 2.7 ratio (ref 0.0–4.4)
Cholesterol, Total: 141 mg/dL (ref 100–199)
HDL: 52 mg/dL (ref >39)
LDL Chol Calc (NIH): 67 mg/dL (ref 0–99)
Triglycerides: 127 mg/dL (ref 0–149)
VLDL Cholesterol Cal: 22 mg/dL (ref 5–40)

## 2021-12-20 LAB — CBC WITH DIFFERENTIAL/PLATELET
Basophils Absolute: 0.1 10*3/uL (ref 0.0–0.2)
Basos: 1 %
EOS (ABSOLUTE): 0.1 10*3/uL (ref 0.0–0.4)
Eos: 2 %
Hematocrit: 33.3 % — ABNORMAL LOW (ref 34.0–46.6)
Hemoglobin: 10.8 g/dL — ABNORMAL LOW (ref 11.1–15.9)
Immature Grans (Abs): 0 10*3/uL (ref 0.0–0.1)
Immature Granulocytes: 1 %
Lymphocytes Absolute: 1.2 10*3/uL (ref 0.7–3.1)
Lymphs: 15 %
MCH: 29.6 pg (ref 26.6–33.0)
MCHC: 32.4 g/dL (ref 31.5–35.7)
MCV: 91 fL (ref 79–97)
Monocytes Absolute: 0.6 10*3/uL (ref 0.1–0.9)
Monocytes: 7 %
Neutrophils Absolute: 6 10*3/uL (ref 1.4–7.0)
Neutrophils: 74 %
Platelets: 310 10*3/uL (ref 150–450)
RBC: 3.65 x10E6/uL — ABNORMAL LOW (ref 3.77–5.28)
RDW: 12.6 % (ref 11.7–15.4)
WBC: 8 10*3/uL (ref 3.4–10.8)

## 2021-12-20 LAB — TSH: TSH: 4.05 u[IU]/mL (ref 0.450–4.500)

## 2021-12-20 LAB — CARDIOVASCULAR RISK ASSESSMENT

## 2021-12-20 LAB — VITAMIN D 25 HYDROXY (VIT D DEFICIENCY, FRACTURES): Vit D, 25-Hydroxy: 25.8 ng/mL — ABNORMAL LOW (ref 30.0–100.0)

## 2021-12-22 LAB — IRON,TIBC AND FERRITIN PANEL
Ferritin: 14 ng/mL — ABNORMAL LOW (ref 15–150)
Iron Saturation: 9 % — CL (ref 15–55)
Iron: 54 ug/dL (ref 27–159)
Total Iron Binding Capacity: 610 ug/dL (ref 250–450)
UIBC: 556 ug/dL — ABNORMAL HIGH (ref 131–425)

## 2021-12-22 LAB — B12 AND FOLATE PANEL
Folate: 4.4 ng/mL (ref >3.0)
Vitamin B-12: 349 pg/mL (ref 232–1245)

## 2021-12-22 LAB — SPECIMEN STATUS REPORT

## 2021-12-25 ENCOUNTER — Telehealth: Payer: Self-pay

## 2021-12-25 ENCOUNTER — Other Ambulatory Visit: Payer: Self-pay | Admitting: Physician Assistant

## 2021-12-25 DIAGNOSIS — Z1211 Encounter for screening for malignant neoplasm of colon: Secondary | ICD-10-CM

## 2021-12-25 NOTE — Telephone Encounter (Signed)
Patient was notified of lab results.  She does not have a preference for GI referral but would like to stay in [place].

## 2022-01-05 NOTE — Progress Notes (Deleted)
Office Visit Note  Patient: Kaitlyn Diaz             Date of Birth: 08-04-1965           MRN: [id number]             PCP: Marge Duncans, PA-C Referring: Marge Duncans, PA-C Visit Date: 01/16/2022 Occupation: '@GUAROCC'$ @  Subjective:  No chief complaint on file.   History of Present Illness: Kaitlyn Diaz is a 56 y.o. female ***   Activities of Daily Living:  Patient reports morning stiffness for *** {minute/hour:19697}.   Patient {ACTIONS;DENIES/REPORTS:21021675::"Denies"} nocturnal pain.  Difficulty dressing/grooming: {ACTIONS;DENIES/REPORTS:21021675::"Denies"} Difficulty climbing stairs: {ACTIONS;DENIES/REPORTS:21021675::"Denies"} Difficulty getting out of chair: {ACTIONS;DENIES/REPORTS:21021675::"Denies"} Difficulty using hands for taps, buttons, cutlery, and/or writing: {ACTIONS;DENIES/REPORTS:21021675::"Denies"}  No Rheumatology ROS completed.   PMFS History:  Patient Active Problem List   Diagnosis Date Noted   Vitamin D deficiency 09/03/2021   Aortic atherosclerosis ([place]) 09/03/2021   Other emphysema ([place]) 09/03/2021   Chronic left-sided low back pain with left-sided sciatica 11/15/2020   Need for tetanus, diphtheria, and acellular pertussis (Tdap) vaccine 07/13/2020   Acute laryngopharyngitis 07/13/2020   Mixed hyperlipidemia    Generalized anxiety disorder    Essential (primary) hypertension    Spondylosis of cervical region without myelopathy or radiculopathy 12/03/2017   Polyarthralgia 12/06/2015   Arthritis of carpometacarpal joint 07/09/2014   Abnormal mammogram 12/10/2013   Benign essential hypertension 12/10/2013   Brachial neuritis or radiculitis 12/10/2013   Cervical radiculopathy 12/10/2013   DDD (degenerative disc disease), cervical 12/10/2013   DDD (degenerative disc disease), lumbosacral 12/10/2013   Esophageal reflux 12/10/2013   Female stress incontinence 12/10/2013   Hematochezia 12/10/2013   Hidradenitis 12/10/2013   Low back pain  12/10/2013   Lumbosacral spondylosis without myelopathy 12/10/2013   Menopausal and postmenopausal disorder 12/10/2013   Panic disorder without agoraphobia 12/10/2013   Pilonidal cyst 12/10/2013   RLS (restless legs syndrome) 12/10/2013   Thoracic or lumbosacral neuritis or radiculitis, unspecified 12/10/2013   Tobacco dependence 12/10/2013   Essential tremor 05/06/2013   Major depressive disorder, single episode, unspecified 05/06/2013   Displacement of lumbar intervertebral disc without myelopathy 01/27/2013    Past Medical History:  Diagnosis Date   Anemia    Anxiety    "panic attacks, claustophobic on xanax"   Arthritis    Asthma    Complication of anesthesia    Difficult to put to sleep, Has run fever, "runs in famly"   COPD (chronic obstructive pulmonary disease) ([place])    Enlarged heart    hx of   Essential (primary) hypertension    Family history of anesthesia complication    malignant hyperthermia on mother's side of family   Generalized anxiety disorder    GERD (gastroesophageal reflux disease)    H/O hiatal hernia    hx of   Headache(784.0)    "due to Blood pressure"   History of melanoma    Hypertension    Dr. Lucita Lora, medical physician [phone]   Malignant hyperthermia    Mixed hyperlipidemia    Neuromuscular disorder ([place])    "85 % nerve damage in left leg"   Pneumonia    hx of pneumonia x 2   PONV (postoperative nausea and vomiting)    Spinal headache    Urinary tract infection    and kidney infection Hx of    Family History  Problem Relation Age of Onset   Colon cancer Mother    Stroke Maternal Grandmother  Stomach cancer Maternal Grandfather    Past Surgical History:  Procedure Laterality Date   ANTERIOR CERVICAL DECOMP/DISCECTOMY FUSION  12/28/2011   Procedure: ANTERIOR CERVICAL DECOMPRESSION/DISCECTOMY FUSION 2 LEVELS;  Surgeon: Elaina Hoops, MD;  Location: [place] [facility];  Service: Neurosurgery;  Laterality: N/A;  HISTORY OF MALIGNANT  HYPERTHERMIA Anterior Cervical Discectomy Fusion of Cervical five-six, Cervical six-seven   BLADDER SUSPENSION     BREAST SURGERY     left breast mass removed   hand mass     1994, multiple masses removed - "all benign"   leg mass     left leg  surgically removed   POSTERIOR FUSION LUMBAR SPINE     L4/5   TONSILLECTOMY     and adnoids   TUBAL LIGATION     Social History   Social History Narrative   Not on file   Immunization History  Administered Date(s) Administered   Influenza Inj Mdck Quad Pf 04/19/2020, 03/23/2021, 12/19/2021   Influenza Split 12/28/2011   Influenza-Unspecified 01/01/2019   PNEUMOCOCCAL CONJUGATE-20 03/23/2021   Tdap 07/13/2020     Objective: Vital Signs: There were no vitals taken for this visit.   Physical Exam   Musculoskeletal Exam: ***  CDAI Exam: CDAI Score: -- Patient Global: --; Provider Global: -- Swollen: --; Tender: -- Joint Exam 01/16/2022   No joint exam has been documented for this visit   There is currently no information documented on the homunculus. Go to the Rheumatology activity and complete the homunculus joint exam.  Investigation: No additional findings.  Imaging: No results found.  Recent Labs: Lab Results  Component Value Date   WBC 8.0 12/19/2021   HGB 10.8 (L) 12/19/2021   PLT 310 12/19/2021   NA 144 12/19/2021   K 4.7 12/19/2021   CL 105 12/19/2021   CO2 23 12/19/2021   GLUCOSE 102 (H) 12/19/2021   BUN 12 12/19/2021   CREATININE 0.65 12/19/2021   BILITOT <0.2 12/19/2021   ALKPHOS 98 12/19/2021   AST 15 12/19/2021   ALT 8 12/19/2021   PROT 6.7 12/19/2021   ALBUMIN 4.5 12/19/2021   CALCIUM 9.4 12/19/2021   GFRAA 113 04/19/2020    Speciality Comments: No specialty comments available.  Procedures:  No procedures performed Allergies: Amoxicillin, Penicillins, Codeine, Oxycodone, Oxycodone hcl, and Oxycodone hcl   Assessment / Plan:     Visit Diagnoses: Polyarthritis with positive rheumatoid  factor ([place]) - 06/12/21: ANA negative, vitamin D 37.2, anti-CCP 59, RF 16.2  Spondylosis of cervical region without myelopathy or radiculopathy  Lumbosacral spondylosis without myelopathy  Arthritis of carpometacarpal joint  Hidradenitis  Pilonidal cyst  Essential tremor  History of gastroesophageal reflux (GERD)  Other emphysema (HCC)  Aortic atherosclerosis ([place])  Essential (primary) hypertension  Vitamin D deficiency  Tobacco dependence  RLS (restless legs syndrome)  Panic disorder without agoraphobia  Mixed hyperlipidemia  Generalized anxiety disorder  Orders: No orders of the defined types were placed in this encounter.  No orders of the defined types were placed in this encounter.   Face-to-face time spent with patient was *** minutes. Greater than 50% of time was spent in counseling and coordination of care.  Follow-Up Instructions: No follow-ups on file.   Ofilia Neas, PA-C  Note - This record has been created using Dragon software.  Chart creation errors have been sought, but may not always  have been located. Such creation errors do not reflect on  the standard of medical care.

## 2022-01-16 ENCOUNTER — Ambulatory Visit: Payer: Medicare Other | Attending: Rheumatology | Admitting: Rheumatology

## 2022-01-16 DIAGNOSIS — G250 Essential tremor: Secondary | ICD-10-CM

## 2022-01-16 DIAGNOSIS — F17200 Nicotine dependence, unspecified, uncomplicated: Secondary | ICD-10-CM

## 2022-01-16 DIAGNOSIS — L732 Hidradenitis suppurativa: Secondary | ICD-10-CM

## 2022-01-16 DIAGNOSIS — I10 Essential (primary) hypertension: Secondary | ICD-10-CM

## 2022-01-16 DIAGNOSIS — M47817 Spondylosis without myelopathy or radiculopathy, lumbosacral region: Secondary | ICD-10-CM

## 2022-01-16 DIAGNOSIS — E559 Vitamin D deficiency, unspecified: Secondary | ICD-10-CM

## 2022-01-16 DIAGNOSIS — G2581 Restless legs syndrome: Secondary | ICD-10-CM

## 2022-01-16 DIAGNOSIS — M47812 Spondylosis without myelopathy or radiculopathy, cervical region: Secondary | ICD-10-CM

## 2022-01-16 DIAGNOSIS — E782 Mixed hyperlipidemia: Secondary | ICD-10-CM

## 2022-01-16 DIAGNOSIS — L0591 Pilonidal cyst without abscess: Secondary | ICD-10-CM

## 2022-01-16 DIAGNOSIS — Z8719 Personal history of other diseases of the digestive system: Secondary | ICD-10-CM

## 2022-01-16 DIAGNOSIS — F411 Generalized anxiety disorder: Secondary | ICD-10-CM

## 2022-01-16 DIAGNOSIS — F410 Panic disorder [episodic paroxysmal anxiety] without agoraphobia: Secondary | ICD-10-CM

## 2022-01-16 DIAGNOSIS — M0580 Other rheumatoid arthritis with rheumatoid factor of unspecified site: Secondary | ICD-10-CM

## 2022-01-16 DIAGNOSIS — I700 Atherosclerosis of aorta: Secondary | ICD-10-CM

## 2022-01-16 DIAGNOSIS — M19049 Primary osteoarthritis, unspecified hand: Secondary | ICD-10-CM

## 2022-01-16 DIAGNOSIS — J438 Other emphysema: Secondary | ICD-10-CM

## 2022-01-22 ENCOUNTER — Other Ambulatory Visit: Payer: Self-pay | Admitting: Physician Assistant

## 2022-01-22 DIAGNOSIS — F411 Generalized anxiety disorder: Secondary | ICD-10-CM

## 2022-02-13 ENCOUNTER — Telehealth: Payer: Self-pay

## 2022-02-13 NOTE — Telephone Encounter (Signed)
Patient has an appointment for tomorrow

## 2022-02-13 NOTE — Telephone Encounter (Signed)
Patient called and stated she is drain no energy at all, was wondering if it's due to her iron, stated it was low at her last lab. Patient is wanting to know can she get B12 shots, she is taking iron OTC with her dinner every night. Please Advise.

## 2022-02-14 ENCOUNTER — Ambulatory Visit (INDEPENDENT_AMBULATORY_CARE_PROVIDER_SITE_OTHER): Payer: Medicare Other | Admitting: Physician Assistant

## 2022-02-14 ENCOUNTER — Encounter: Payer: Self-pay | Admitting: Physician Assistant

## 2022-02-14 VITALS — BP 110/74 | HR 99 | Temp 97.2°F | Ht 61.25 in | Wt 170.0 lb

## 2022-02-14 DIAGNOSIS — R5383 Other fatigue: Secondary | ICD-10-CM | POA: Diagnosis not present

## 2022-02-14 DIAGNOSIS — D508 Other iron deficiency anemias: Secondary | ICD-10-CM

## 2022-02-14 DIAGNOSIS — E559 Vitamin D deficiency, unspecified: Secondary | ICD-10-CM | POA: Diagnosis not present

## 2022-02-14 MED ORDER — IRON (FERROUS SULFATE) 325 (65 FE) MG PO TABS: 325.0000 mg | ORAL_TABLET | Freq: Two times a day (BID) | ORAL | 1 refills | Status: DC

## 2022-02-14 MED ORDER — VITAMIN D (ERGOCALCIFEROL) 1.25 MG (50000 UNIT) PO CAPS: 50000.0000 [IU] | ORAL_CAPSULE | ORAL | 5 refills | Status: DC

## 2022-02-14 NOTE — Progress Notes (Signed)
Acute Office Visit  Subjective:    Patient ID: Kaitlyn Diaz, female    DOB: Sep 20, 1965, 56 y.o.   MRN: [id number]  Chief Complaint  Patient presents with   Fatigue    HPI: Patient is in today for follow up of fatigue.  Pt was noted to have iron def anemia at last visit 8 weeks ago - at that time was advised to get otc iron supplement (which she did start but at a very low dose) and was referred to GI for further evaluation - she has had trouble getting appointment and has not been scheduled yet and was advised to come in our office today for recheck She will be calling Dr Misenheimer's office tomorrow am to get appt scheduled with him but with her symptoms worsening and the fact she has been on very low dose otc iron supplement her iron stores have probably worsened and at this juncture pending results will go ahead and refer to hematology for iron infusions  Of note pt had Vit D level at last visit which was slightly low - will initiate rx therapy for that as well  Past Medical History:  Diagnosis Date   Anemia    Anxiety    "panic attacks, claustophobic on xanax"   Arthritis    Asthma    Complication of anesthesia    Difficult to put to sleep, Has run fever, "runs in famly"   COPD (chronic obstructive pulmonary disease) ([place])    Enlarged heart    hx of   Essential (primary) hypertension    Family history of anesthesia complication    malignant hyperthermia on mother's side of family   Generalized anxiety disorder    GERD (gastroesophageal reflux disease)    H/O hiatal hernia    hx of   Headache(784.0)    "due to Blood pressure"   History of melanoma    Hypertension    Dr. Lucita Lora, medical physician [phone]   Malignant hyperthermia    Mixed hyperlipidemia    Neuromuscular disorder ([place])    "85 % nerve damage in left leg"   Pneumonia    hx of pneumonia x 2   PONV (postoperative nausea and vomiting)    Spinal headache    Urinary tract infection    and  kidney infection Hx of    Past Surgical History:  Procedure Laterality Date   ANTERIOR CERVICAL DECOMP/DISCECTOMY FUSION  12/28/2011   Procedure: ANTERIOR CERVICAL DECOMPRESSION/DISCECTOMY FUSION 2 LEVELS;  Surgeon: Elaina Hoops, MD;  Location: [facility];  Service: Neurosurgery;  Laterality: N/A;  HISTORY OF MALIGNANT HYPERTHERMIA Anterior Cervical Discectomy Fusion of Cervical five-six, Cervical six-seven   BLADDER SUSPENSION     BREAST SURGERY     left breast mass removed   hand mass     1994, multiple masses removed - "all benign"   leg mass     left leg  surgically removed   POSTERIOR FUSION LUMBAR SPINE     L4/5   TONSILLECTOMY     and adnoids   TUBAL LIGATION      Family History  Problem Relation Age of Onset   Colon cancer Mother    Stroke Maternal Grandmother    Stomach cancer Maternal Grandfather     Social History   Socioeconomic History   Marital status: Married    Spouse name: Not on file   Number of children: 2   Years of education: Not on file   Highest education  level: Not on file  Occupational History   Occupation: Disabled  Tobacco Use   Smoking status: Every Day    Packs/day: 0.75    Years: 34.00    Total pack years: 25.50    Types: Cigarettes   Smokeless tobacco: Never  Vaping Use   Vaping Use: Never used  Substance and Sexual Activity   Alcohol use: Not Currently    Comment: Drinks occasionally, and consumes wine.   Drug use: No   Sexual activity: Never  Other Topics Concern   Not on file  Social History Narrative   Not on file   Social Determinants of Health   Financial Resource Strain: Not on file  Food Insecurity: Not on file  Transportation Needs: Not on file  Physical Activity: Not on file  Stress: Not on file  Social Connections: Not on file  Intimate Partner Violence: Not on file    Outpatient Medications Prior to Visit  Medication Sig Dispense Refill   albuterol (VENTOLIN HFA) 108 (90 Base) MCG/ACT inhaler Inhale 1  puff into the lungs 3 (three) times daily as needed. For shortness of breath 1 each 3   ALPRAZolam (XANAX) 1 MG tablet TAKE 1 TABLET BY MOUTH 3 TIMES DAILY AS NEEDED FOR ANXIETY 90 tablet 1   amLODipine (NORVASC) 10 MG tablet TAKE 1 TABLET BY MOUTH EVERY DAY 90 tablet 0   azelastine (ASTELIN) 0.1 % nasal spray Place into both nostrils.     benzonatate (TESSALON) 100 MG capsule TAKE 1 CAPSULE BY MOUTH THREE TIMES A DAY AS NEEDED FOR COUGH 40 capsule 1   buPROPion (WELLBUTRIN SR) 150 MG 12 hr tablet Take 1 tablet (150 mg total) by mouth 2 (two) times daily. 60 tablet 1   busPIRone (BUSPAR) 15 MG tablet TAKE 1 TABLET BY MOUTH 2 TIMES A DAY 180 tablet 0   DULoxetine (CYMBALTA) 60 MG capsule TAKE 1 CAPSULE BY MOUTH 2 TIMES DAILY 180 capsule 1   fenofibrate micronized (LOFIBRA) 134 MG capsule TAKE 1 CAPSULE BY MOUTH EACH DAY 90 capsule 1   fluticasone furoate-vilanterol (BREO ELLIPTA) 200-25 MCG/ACT AEPB Inhale 1 puff into the lungs daily. 1 each 11   furosemide (LASIX) 20 MG tablet Take 1 tablet (20 mg total) by mouth daily. 30 tablet 3   gabapentin (NEURONTIN) 300 MG capsule TAKE 3 CAPSULES BY MOUTH 2 TIMES DAILY 180 capsule 2   lisinopril (ZESTRIL) 20 MG tablet TAKE 1 TABLET BY MOUTH EVERY DAY 90 tablet 0   omeprazole (PRILOSEC) 40 MG capsule TAKE 1 CAPSULE BY MOUTH DAILY BEFORE A MEAL 90 capsule 1   promethazine-dextromethorphan (PROMETHAZINE-DM) 6.25-15 MG/5ML syrup Take 5 mLs by mouth 4 (four) times daily as needed. 118 mL 0   rosuvastatin (CRESTOR) 5 MG tablet TAKE 1 TABLET BY MOUTH EACH DAY FOR CHOLESTEROL 90 tablet 1   tiZANidine (ZANAFLEX) 4 MG capsule Take 1 capsule (4 mg total) by mouth 2 (two) times daily as needed for muscle spasms. 60 capsule 2   No facility-administered medications prior to visit.    Allergies  Allergen Reactions   Amoxicillin Anaphylaxis, Itching and Swelling   Penicillins Itching and Swelling    Throat swelling Throat swelling    Codeine Other (See Comments)     nausea   Oxycodone Other (See Comments)    Hallucinations   Oxycodone Hcl Other (See Comments)    hallucinations   Oxycodone Hcl     Review of Systems CONSTITUTIONAL: see HPI E/N/T: Negative for ear pain, nasal  congestion and sore throat.  CARDIOVASCULAR: Negative for chest pain, dizziness, palpitations and pedal edema.  RESPIRATORY: Negative for recent cough and dyspnea.  GASTROINTESTINAL: Negative for abdominal pain, acid reflux symptoms, constipation, diarrhea, nausea and vomiting.       Objective:  PHYSICAL EXAM:   VS: BP 110/74 (BP Location: Right Arm, Patient Position: Sitting, Cuff Size: Large)   Pulse 99   Temp (!) 97.2 F (36.2 C) (Temporal)   Ht 5' 1.25" (1.556 m)   Wt 170 lb (77.1 kg)   SpO2 97%   BMI 31.86 kg/m   GEN: Well nourished, well developed, in no acute distress  HEENT: normal external ears and nose - normal external auditory canals and TMS -  - Lips, Teeth and Gums - normal  Oropharynx - normal mucosa, palate, and posterior pharynx Cardiac: RRR; no murmurs,  Respiratory:  normal respiratory rate and pattern with no distress - normal breath sounds with no rales, rhonchi, wheezes or rubs   Health Maintenance Due  Topic Date Due   Medicare Annual Wellness (AWV)  Never done   Zoster Vaccines- Shingrix (1 of 2) Never done   PAP SMEAR-Modifier  Never done   COLONOSCOPY (Pts 45-61yr Insurance coverage will need to be confirmed)  12/16/2019    There are no preventive care reminders to display for this patient.   Lab Results  Component Value Date   TSH 4.050 12/19/2021   Lab Results  Component Value Date   WBC 8.0 12/19/2021   HGB 10.8 (L) 12/19/2021   HCT 33.3 (L) 12/19/2021   MCV 91 12/19/2021   PLT 310 12/19/2021   Lab Results  Component Value Date   NA 144 12/19/2021   K 4.7 12/19/2021   CO2 23 12/19/2021   GLUCOSE 102 (H) 12/19/2021   BUN 12 12/19/2021   CREATININE 0.65 12/19/2021   BILITOT <0.2 12/19/2021   ALKPHOS 98  12/19/2021   AST 15 12/19/2021   ALT 8 12/19/2021   PROT 6.7 12/19/2021   ALBUMIN 4.5 12/19/2021   CALCIUM 9.4 12/19/2021   EGFR 103 12/19/2021   Lab Results  Component Value Date   CHOL 141 12/19/2021   Lab Results  Component Value Date   HDL 52 12/19/2021   Lab Results  Component Value Date   LDLCALC 67 12/19/2021   Lab Results  Component Value Date   TRIG 127 12/19/2021   Lab Results  Component Value Date   CHOLHDL 2.7 12/19/2021   Lab Results  Component Value Date   HGBA1C 5.3 12/19/2021       Assessment & Plan:   Problem List Items Addressed This Visit       Other   Vitamin D deficiency   Relevant Medications   Vitamin D, Ergocalciferol, (DRISDOL) 1.25 MG (50000 UNIT) CAPS capsule   Other Visit Diagnoses     Other iron deficiency anemia    -  Primary   Relevant Medications   Iron, Ferrous Sulfate, 325 (65 Fe) MG TABS   Other Relevant Orders   CBC with Differential/Platelet   Iron, TIBC and Ferritin Panel   Comprehensive metabolic panel Call GI office to schedule appt   Other fatigue       Relevant Orders   CBC with Differential/Platelet   Iron, TIBC and Ferritin Panel   Comprehensive metabolic panel      Meds ordered this encounter  Medications   Iron, Ferrous Sulfate, 325 (65 Fe) MG TABS    Sig: Take 325 mg by  mouth 2 (two) times daily.    Dispense:  60 tablet    Refill:  1    Order Specific Question:   Supervising Provider    Answer:   Shelton Silvas   Vitamin D, Ergocalciferol, (DRISDOL) 1.25 MG (50000 UNIT) CAPS capsule    Sig: Take 1 capsule (50,000 Units total) by mouth every 7 (seven) days.    Dispense:  5 capsule    Refill:  5    Order Specific Question:   Supervising Provider    AnswerShelton Silvas    Orders Placed This Encounter  Procedures   CBC with Differential/Platelet   Iron, TIBC and Ferritin Panel   Comprehensive metabolic panel     Follow-up: Return in about 4 weeks (around 03/14/2022) for  follow up.  An After Visit Summary was printed and given to the patient.  Yetta Flock [facility] [phone]

## 2022-02-15 ENCOUNTER — Other Ambulatory Visit: Payer: Self-pay | Admitting: Physician Assistant

## 2022-02-15 DIAGNOSIS — D508 Other iron deficiency anemias: Secondary | ICD-10-CM

## 2022-02-15 LAB — CBC WITH DIFFERENTIAL/PLATELET
Basophils Absolute: 0 10*3/uL (ref 0.0–0.2)
Basos: 1 %
EOS (ABSOLUTE): 0 10*3/uL (ref 0.0–0.4)
Eos: 1 %
Hematocrit: 38.4 % (ref 34.0–46.6)
Hemoglobin: 13 g/dL (ref 11.1–15.9)
Immature Grans (Abs): 0 10*3/uL (ref 0.0–0.1)
Immature Granulocytes: 1 %
Lymphocytes Absolute: 1.4 10*3/uL (ref 0.7–3.1)
Lymphs: 22 %
MCH: 30.2 pg (ref 26.6–33.0)
MCHC: 33.9 g/dL (ref 31.5–35.7)
MCV: 89 fL (ref 79–97)
Monocytes Absolute: 0.4 10*3/uL (ref 0.1–0.9)
Monocytes: 7 %
Neutrophils Absolute: 4.4 10*3/uL (ref 1.4–7.0)
Neutrophils: 68 %
Platelets: 365 10*3/uL (ref 150–450)
RBC: 4.31 x10E6/uL (ref 3.77–5.28)
RDW: 13.4 % (ref 11.7–15.4)
WBC: 6.3 10*3/uL (ref 3.4–10.8)

## 2022-02-15 LAB — IRON,TIBC AND FERRITIN PANEL
Ferritin: 40 ng/mL (ref 15–150)
Iron Saturation: 14 % — ABNORMAL LOW (ref 15–55)
Iron: 82 ug/dL (ref 27–159)
Total Iron Binding Capacity: 605 ug/dL (ref 250–450)
UIBC: 523 ug/dL — ABNORMAL HIGH (ref 131–425)

## 2022-02-15 LAB — COMPREHENSIVE METABOLIC PANEL
ALT: 15 IU/L (ref 0–32)
AST: 21 IU/L (ref 0–40)
Albumin/Globulin Ratio: 2.3 — ABNORMAL HIGH (ref 1.2–2.2)
Albumin: 5.2 g/dL — ABNORMAL HIGH (ref 3.8–4.9)
Alkaline Phosphatase: 91 IU/L (ref 44–121)
BUN/Creatinine Ratio: 21 (ref 9–23)
BUN: 20 mg/dL (ref 6–24)
Bilirubin Total: <0.2 mg/dL (ref 0.0–1.2)
CO2: 16 mmol/L — ABNORMAL LOW (ref 20–29)
Calcium: 9.7 mg/dL (ref 8.7–10.2)
Chloride: 103 mmol/L (ref 96–106)
Creatinine, Ser: 0.96 mg/dL (ref 0.57–1.00)
Globulin, Total: 2.3 g/dL (ref 1.5–4.5)
Glucose: 87 mg/dL (ref 70–99)
Potassium: 4.7 mmol/L (ref 3.5–5.2)
Sodium: 139 mmol/L (ref 134–144)
Total Protein: 7.5 g/dL (ref 6.0–8.5)
eGFR: 69 mL/min/{1.73_m2} (ref >59)

## 2022-02-15 MED ORDER — IRON (FERROUS SULFATE) 325 (65 FE) MG PO TABS: 325.0000 mg | ORAL_TABLET | Freq: Every day | ORAL | 1 refills | Status: DC

## 2022-02-22 ENCOUNTER — Other Ambulatory Visit: Payer: Self-pay | Admitting: Physician Assistant

## 2022-02-26 ENCOUNTER — Other Ambulatory Visit: Payer: Self-pay | Admitting: Family Medicine

## 2022-02-27 ENCOUNTER — Other Ambulatory Visit: Payer: Self-pay

## 2022-02-27 NOTE — Telephone Encounter (Signed)
Kaitlyn Diaz called to request a refill on cyclobenzaprine.  This was denied yesterday at the pharmacy.  Marge Duncans, PA advised that we confirm that she is not taking tizanidine. I left a message for her to call back since the cyclobenzaprine was removed from her medication list.

## 2022-02-28 ENCOUNTER — Other Ambulatory Visit: Payer: Self-pay | Admitting: Physician Assistant

## 2022-03-05 ENCOUNTER — Other Ambulatory Visit: Payer: Self-pay | Admitting: Family Medicine

## 2022-03-08 ENCOUNTER — Ambulatory Visit (INDEPENDENT_AMBULATORY_CARE_PROVIDER_SITE_OTHER): Payer: Medicare Other | Admitting: Physician Assistant

## 2022-03-08 ENCOUNTER — Encounter: Payer: Self-pay | Admitting: Physician Assistant

## 2022-03-08 VITALS — BP 110/70 | HR 77 | Temp 97.1°F | Ht 61.5 in | Wt 174.0 lb

## 2022-03-08 DIAGNOSIS — J069 Acute upper respiratory infection, unspecified: Secondary | ICD-10-CM

## 2022-03-08 DIAGNOSIS — J40 Bronchitis, not specified as acute or chronic: Secondary | ICD-10-CM | POA: Diagnosis not present

## 2022-03-08 LAB — POCT INFLUENZA A/B
Influenza A, POC: NEGATIVE
Influenza B, POC: NEGATIVE

## 2022-03-08 LAB — POC COVID19 BINAXNOW: SARS Coronavirus 2 Ag: NEGATIVE

## 2022-03-08 MED ORDER — AZITHROMYCIN 250 MG PO TABS: ORAL_TABLET | ORAL | 0 refills | Status: AC

## 2022-03-08 MED ORDER — TRIAMCINOLONE ACETONIDE 40 MG/ML IJ SUSP
60.0000 mg | Freq: Once | INTRAMUSCULAR | Status: AC
  Administered 2022-03-08: 60 mg via INTRAMUSCULAR

## 2022-03-08 MED ORDER — PREDNISONE 20 MG PO TABS: ORAL_TABLET | ORAL | 0 refills | Status: AC

## 2022-03-08 MED ORDER — PROMETHAZINE-DM 6.25-15 MG/5ML PO SYRP: 5.0000 mL | ORAL_SOLUTION | Freq: Four times a day (QID) | ORAL | 0 refills | Status: DC | PRN

## 2022-03-08 NOTE — Progress Notes (Signed)
Acute Office Visit  Subjective:    Patient ID: Kaitlyn Diaz, female    DOB: 01-09-1966, 57 y.o.   MRN: [id number]  Chief Complaint  Patient presents with   Headache   Cough    Congestion, Body aches    HPI: Patient is in today for complaints of cough, congestion, headache and malaise for the past 4 days.  Has had a low grade fever.  Cough at times productive Denies wheezing or shortness of breath Has been using otc decongestants with minimal relief  Past Medical History:  Diagnosis Date   Anemia    Anxiety    "panic attacks, claustophobic on xanax"   Arthritis    Asthma    Complication of anesthesia    Difficult to put to sleep, Has run fever, "runs in famly"   COPD (chronic obstructive pulmonary disease) ([place])    Enlarged heart    hx of   Essential (primary) hypertension    Family history of anesthesia complication    malignant hyperthermia on mother's side of family   Generalized anxiety disorder    GERD (gastroesophageal reflux disease)    H/O hiatal hernia    hx of   Headache(784.0)    "due to Blood pressure"   History of melanoma    Hypertension    Dr. Lucita Lora, medical physician [phone]   Malignant hyperthermia    Mixed hyperlipidemia    Neuromuscular disorder ([place])    "85 % nerve damage in left leg"   Pneumonia    hx of pneumonia x 2   PONV (postoperative nausea and vomiting)    Spinal headache    Urinary tract infection    and kidney infection Hx of    Past Surgical History:  Procedure Laterality Date   ANTERIOR CERVICAL DECOMP/DISCECTOMY FUSION  12/28/2011   Procedure: ANTERIOR CERVICAL DECOMPRESSION/DISCECTOMY FUSION 2 LEVELS;  Surgeon: Elaina Hoops, MD;  Location: [facility];  Service: Neurosurgery;  Laterality: N/A;  HISTORY OF MALIGNANT HYPERTHERMIA Anterior Cervical Discectomy Fusion of Cervical five-six, Cervical six-seven   BLADDER SUSPENSION     BREAST SURGERY     left breast mass removed   hand mass     1994, multiple  masses removed - "all benign"   leg mass     left leg  surgically removed   POSTERIOR FUSION LUMBAR SPINE     L4/5   TONSILLECTOMY     and adnoids   TUBAL LIGATION      Family History  Problem Relation Age of Onset   Colon cancer Mother    Stroke Maternal Grandmother    Stomach cancer Maternal Grandfather     Social History   Socioeconomic History   Marital status: Married    Spouse name: Not on file   Number of children: 2   Years of education: Not on file   Highest education level: Not on file  Occupational History   Occupation: Disabled  Tobacco Use   Smoking status: Every Day    Packs/day: 0.75    Years: 34.00    Total pack years: 25.50    Types: Cigarettes   Smokeless tobacco: Never  Vaping Use   Vaping Use: Never used  Substance and Sexual Activity   Alcohol use: Not Currently    Comment: Drinks occasionally, and consumes wine.   Drug use: No   Sexual activity: Never  Other Topics Concern   Not on file  Social History Narrative   Not on file  Social Determinants of Health   Financial Resource Strain: Not on file  Food Insecurity: Not on file  Transportation Needs: Not on file  Physical Activity: Not on file  Stress: Not on file  Social Connections: Not on file  Intimate Partner Violence: Not on file    Outpatient Medications Prior to Visit  Medication Sig Dispense Refill   albuterol (VENTOLIN HFA) 108 (90 Base) MCG/ACT inhaler Inhale 1 puff into the lungs 3 (three) times daily as needed. For shortness of breath 1 each 3   ALPRAZolam (XANAX) 1 MG tablet TAKE 1 TABLET BY MOUTH 3 TIMES DAILY AS NEEDED FOR ANXIETY 90 tablet 1   amLODipine (NORVASC) 10 MG tablet TAKE 1 TABLET BY MOUTH EVERY DAY 90 tablet 0   azelastine (ASTELIN) 0.1 % nasal spray Place into both nostrils.     benzonatate (TESSALON) 100 MG capsule TAKE 1 CAPSULE BY MOUTH THREE TIMES A DAY AS NEEDED FOR COUGH 40 capsule 1   buPROPion (WELLBUTRIN SR) 150 MG 12 hr tablet Take 1 tablet  (150 mg total) by mouth 2 (two) times daily. 60 tablet 1   busPIRone (BUSPAR) 15 MG tablet TAKE 1 TABLET BY MOUTH 2 TIMES A DAY 180 tablet 0   DULoxetine (CYMBALTA) 60 MG capsule TAKE 1 CAPSULE BY MOUTH 2 TIMES DAILY 180 capsule 1   fenofibrate micronized (LOFIBRA) 134 MG capsule TAKE 1 CAPSULE BY MOUTH EACH DAY 90 capsule 1   fluticasone furoate-vilanterol (BREO ELLIPTA) 200-25 MCG/ACT AEPB Inhale 1 puff into the lungs daily. 1 each 11   furosemide (LASIX) 20 MG tablet Take 1 tablet (20 mg total) by mouth daily. 30 tablet 3   gabapentin (NEURONTIN) 300 MG capsule TAKE 3 CAPSULES BY MOUTH 2 TIMES DAILY 180 capsule 2   Iron, Ferrous Sulfate, 325 (65 Fe) MG TABS Take 325 mg by mouth daily. 60 tablet 1   lisinopril (ZESTRIL) 20 MG tablet TAKE ONE TABLET BY MOUTH EACH DAY FOR BLOOD PRESSURE 90 tablet 0   meloxicam (MOBIC) 7.5 MG tablet TAKE 1 TABLET BY MOUTH EVERY DAY 90 tablet 1   omeprazole (PRILOSEC) 40 MG capsule TAKE 1 CAPSULE BY MOUTH DAILY BEFORE A MEAL 90 capsule 1   rosuvastatin (CRESTOR) 5 MG tablet TAKE 1 TABLET BY MOUTH EVERY DAY FOR CHOLESTEROL 90 tablet 1   tiZANidine (ZANAFLEX) 4 MG capsule Take 1 capsule (4 mg total) by mouth 2 (two) times daily as needed for muscle spasms. 60 capsule 2   Vitamin D, Ergocalciferol, (DRISDOL) 1.25 MG (50000 UNIT) CAPS capsule Take 1 capsule (50,000 Units total) by mouth every 7 (seven) days. 5 capsule 5   promethazine-dextromethorphan (PROMETHAZINE-DM) 6.25-15 MG/5ML syrup Take 5 mLs by mouth 4 (four) times daily as needed. 118 mL 0   No facility-administered medications prior to visit.    Allergies  Allergen Reactions   Amoxicillin Anaphylaxis, Itching and Swelling   Penicillins Itching and Swelling    Throat swelling Throat swelling    Codeine Other (See Comments)    nausea   Oxycodone Other (See Comments)    Hallucinations   Oxycodone Hcl Other (See Comments)    hallucinations   Oxycodone Hcl     Review of Systems CONSTITUTIONAL: see  HPI E/N/T: see HPI CARDIOVASCULAR: Negative for chest pain, dizziness, palpitations and pedal edema.  RESPIRATORY:see HPI GASTROINTESTINAL: Negative for abdominal pain, acid reflux symptoms, constipation, diarrhea, nausea and vomiting.          Objective:  PHYSICAL EXAM:   VS: BP  110/70 (BP Location: Left Arm, Patient Position: Sitting, Cuff Size: Large)   Pulse 77   Temp (!) 97.1 F (36.2 C) (Temporal)   Ht 5' 1.5" (1.562 m)   Wt 174 lb (78.9 kg)   SpO2 97%   BMI 32.34 kg/m   GEN: Well nourished, well developed, in no acute distress  HEENT: normal external ears and nose - normal external auditory canals and TMS - Lips, Teeth and Gums - normal  Oropharynx - erythema/pnd Neck: no JVD or masses - no thyromegaly Cardiac: RRR; no murmurs,  Respiratory:  scattered rhonchi noted Skin: warm and dry, no rash   Office Visit on 03/08/2022  Component Date Value Ref Range Status   SARS Coronavirus 2 Ag 03/08/2022 Negative  Negative Final   Influenza A, POC 03/08/2022 Negative  Negative Final   Influenza B, POC 03/08/2022 Negative  Negative Final     Health Maintenance Due  Topic Date Due   Medicare Annual Wellness (AWV)  Never done   Zoster Vaccines- Shingrix (1 of 2) Never done   PAP SMEAR-Modifier  Never done   COLONOSCOPY (Pts 45-12yr Insurance coverage will need to be confirmed)  12/16/2019    There are no preventive care reminders to display for this patient.   Lab Results  Component Value Date   TSH 4.050 12/19/2021   Lab Results  Component Value Date   WBC 6.3 02/14/2022   HGB 13.0 02/14/2022   HCT 38.4 02/14/2022   MCV 89 02/14/2022   PLT 365 02/14/2022   Lab Results  Component Value Date   NA 139 02/14/2022   K 4.7 02/14/2022   CO2 16 (L) 02/14/2022   GLUCOSE 87 02/14/2022   BUN 20 02/14/2022   CREATININE 0.96 02/14/2022   BILITOT <0.2 02/14/2022   ALKPHOS 91 02/14/2022   AST 21 02/14/2022   ALT 15 02/14/2022   PROT 7.5 02/14/2022   ALBUMIN 5.2  (H) 02/14/2022   CALCIUM 9.7 02/14/2022   EGFR 69 02/14/2022   Lab Results  Component Value Date   CHOL 141 12/19/2021   Lab Results  Component Value Date   HDL 52 12/19/2021   Lab Results  Component Value Date   LDLCALC 67 12/19/2021   Lab Results  Component Value Date   TRIG 127 12/19/2021   Lab Results  Component Value Date   CHOLHDL 2.7 12/19/2021   Lab Results  Component Value Date   HGBA1C 5.3 12/19/2021       Assessment & Plan:   Problem List Items Addressed This Visit   None Visit Diagnoses     Acute upper respiratory infection    -  Primary   Relevant Medications   triamcinolone acetonide (KENALOG-40) injection 60 mg (Completed)   azithromycin (ZITHROMAX) 250 MG tablet   Other Relevant Orders   POC COVID-19 BinaxNow (Completed)   POCT Influenza A/B (Completed)   Bronchitis       Relevant Medications   azithromycin (ZITHROMAX) 250 MG tablet   predniSONE (DELTASONE) 20 MG tablet   promethazine-dextromethorphan (PROMETHAZINE-DM) 6.25-15 MG/5ML syrup      Meds ordered this encounter  Medications   triamcinolone acetonide (KENALOG-40) injection 60 mg   azithromycin (ZITHROMAX) 250 MG tablet    Sig: Take 2 tablets on day 1, then 1 tablet daily on days 2 through 5    Dispense:  6 tablet    Refill:  0    Order Specific Question:   Supervising Provider    Answer:  COX, KIRSTEN [[id number]]   predniSONE (DELTASONE) 20 MG tablet    Sig: Take 3 tablets (60 mg total) by mouth daily with breakfast for 3 days, THEN 2 tablets (40 mg total) daily with breakfast for 3 days, THEN 1 tablet (20 mg total) daily with breakfast for 3 days.    Dispense:  18 tablet    Refill:  0    Order Specific Question:   Supervising Provider    Answer:   Shelton Silvas   promethazine-dextromethorphan (PROMETHAZINE-DM) 6.25-15 MG/5ML syrup    Sig: Take 5 mLs by mouth 4 (four) times daily as needed.    Dispense:  118 mL    Refill:  0    Order Specific Question:   Supervising  Provider    AnswerShelton Silvas    Orders Placed This Encounter  Procedures   POC COVID-19 BinaxNow   POCT Influenza A/B     Follow-up: Return in about 2 weeks (around 03/22/2022) for follow up and labwork with me - 20 min.  An After Visit Summary was printed and given to the patient.  Yetta Flock [facility] [phone]

## 2022-03-17 ENCOUNTER — Other Ambulatory Visit: Payer: Self-pay | Admitting: Physician Assistant

## 2022-03-17 DIAGNOSIS — E782 Mixed hyperlipidemia: Secondary | ICD-10-CM

## 2022-03-20 ENCOUNTER — Other Ambulatory Visit: Payer: Self-pay | Admitting: Physician Assistant

## 2022-03-20 ENCOUNTER — Other Ambulatory Visit: Payer: Self-pay | Admitting: Family Medicine

## 2022-03-20 DIAGNOSIS — F411 Generalized anxiety disorder: Secondary | ICD-10-CM

## 2022-04-14 ENCOUNTER — Other Ambulatory Visit: Payer: Self-pay | Admitting: Physician Assistant

## 2022-04-14 DIAGNOSIS — D508 Other iron deficiency anemias: Secondary | ICD-10-CM

## 2022-04-14 DIAGNOSIS — F411 Generalized anxiety disorder: Secondary | ICD-10-CM

## 2022-04-17 ENCOUNTER — Other Ambulatory Visit: Payer: Self-pay | Admitting: Physician Assistant

## 2022-04-17 DIAGNOSIS — M5137 Other intervertebral disc degeneration, lumbosacral region: Secondary | ICD-10-CM

## 2022-04-19 DIAGNOSIS — M5440 Lumbago with sciatica, unspecified side: Secondary | ICD-10-CM | POA: Diagnosis not present

## 2022-04-24 ENCOUNTER — Ambulatory Visit: Payer: Medicare Other | Admitting: Physician Assistant

## 2022-05-02 ENCOUNTER — Encounter: Payer: Self-pay | Admitting: Physician Assistant

## 2022-05-02 ENCOUNTER — Ambulatory Visit (INDEPENDENT_AMBULATORY_CARE_PROVIDER_SITE_OTHER): Payer: Medicare Other | Admitting: Physician Assistant

## 2022-05-02 VITALS — BP 114/70 | HR 70 | Temp 97.2°F | Ht 61.5 in | Wt 167.4 lb

## 2022-05-02 DIAGNOSIS — Z720 Tobacco use: Secondary | ICD-10-CM

## 2022-05-02 DIAGNOSIS — M5137 Other intervertebral disc degeneration, lumbosacral region: Secondary | ICD-10-CM | POA: Diagnosis not present

## 2022-05-02 MED ORDER — TIZANIDINE HCL 4 MG PO TABS: ORAL_TABLET | ORAL | 1 refills | Status: DC

## 2022-05-02 MED ORDER — NICOTINE 21 MG/24HR TD PT24: MEDICATED_PATCH | TRANSDERMAL | 0 refills | Status: DC

## 2022-05-02 NOTE — Progress Notes (Signed)
Established Patient Office Visit  Subjective:  Patient ID: Kaitlyn Diaz, female    DOB: 03-05-1966  Age: 57 y.o. MRN: [id number]  CC:  Chief Complaint  Patient presents with   Nicotine Dependence    HPI Kaitlyn Diaz presents for chronic low back pain - she has seen neurosurgeon and will be having MRI tomorrow. The plan is to have surgery however she has been advised to stop smoking.  She states she would also like to try another type of muscle relaxant besides flexeril for muscle spasms because that is not helping at this time Pt currently smoking 1 ppd (for past 45 years) She has tried chantix in the past and did not tolerate well  Past Medical History:  Diagnosis Date   Anemia    Anxiety    "panic attacks, claustophobic on xanax"   Arthritis    Asthma    Complication of anesthesia    Difficult to put to sleep, Has run fever, "runs in famly"   COPD (chronic obstructive pulmonary disease) ([place])    Enlarged heart    hx of   Essential (primary) hypertension    Family history of anesthesia complication    malignant hyperthermia on mother's side of family   Generalized anxiety disorder    GERD (gastroesophageal reflux disease)    H/O hiatal hernia    hx of   Headache(784.0)    "due to Blood pressure"   History of melanoma    Hypertension    Dr. Lucita Lora, medical physician [phone]   Malignant hyperthermia    Mixed hyperlipidemia    Neuromuscular disorder ([place])    "85 % nerve damage in left leg"   Pneumonia    hx of pneumonia x 2   PONV (postoperative nausea and vomiting)    Spinal headache    Urinary tract infection    and kidney infection Hx of    Past Surgical History:  Procedure Laterality Date   ANTERIOR CERVICAL DECOMP/DISCECTOMY FUSION  12/28/2011   Procedure: ANTERIOR CERVICAL DECOMPRESSION/DISCECTOMY FUSION 2 LEVELS;  Surgeon: Elaina Hoops, MD;  Location: [facility];  Service: Neurosurgery;  Laterality: N/A;  HISTORY OF MALIGNANT  HYPERTHERMIA Anterior Cervical Discectomy Fusion of Cervical five-six, Cervical six-seven   BLADDER SUSPENSION     BREAST SURGERY     left breast mass removed   hand mass     1994, multiple masses removed - "all benign"   leg mass     left leg  surgically removed   POSTERIOR FUSION LUMBAR SPINE     L4/5   TONSILLECTOMY     and adnoids   TUBAL LIGATION      Family History  Problem Relation Age of Onset   Colon cancer Mother    Stroke Maternal Grandmother    Stomach cancer Maternal Grandfather     Social History   Socioeconomic History   Marital status: Married    Spouse name: Not on file   Number of children: 2   Years of education: Not on file   Highest education level: Not on file  Occupational History   Occupation: Disabled  Tobacco Use   Smoking status: Every Day    Packs/day: 0.75    Years: 34.00    Total pack years: 25.50    Types: Cigarettes   Smokeless tobacco: Never  Vaping Use   Vaping Use: Never used  Substance and Sexual Activity   Alcohol use: Not Currently    Comment: Drinks  occasionally, and consumes wine.   Drug use: No   Sexual activity: Never  Other Topics Concern   Not on file  Social History Narrative   Not on file   Social Determinants of Health   Financial Resource Strain: Not on file  Food Insecurity: Not on file  Transportation Needs: Not on file  Physical Activity: Not on file  Stress: Not on file  Social Connections: Not on file  Intimate Partner Violence: Not on file     Current Outpatient Medications:    albuterol (VENTOLIN HFA) 108 (90 Base) MCG/ACT inhaler, Inhale 1 puff into the lungs 3 (three) times daily as needed. For shortness of breath, Disp: 1 each, Rfl: 3   ALPRAZolam (XANAX) 1 MG tablet, TAKE 1 TABLET BY MOUTH 3 TIMES DAILY AS NEEDED FOR ANXIETY, Disp: 90 tablet, Rfl: 0   amLODipine (NORVASC) 10 MG tablet, TAKE 1 TABLET BY MOUTH EVERY DAY, Disp: 90 tablet, Rfl: 0   azelastine (ASTELIN) 0.1 % nasal spray, Place  into both nostrils., Disp: , Rfl:    benzonatate (TESSALON) 100 MG capsule, TAKE 1 CAPSULE BY MOUTH THREE TIMES A DAY AS NEEDED FOR COUGH, Disp: 40 capsule, Rfl: 1   buPROPion (WELLBUTRIN SR) 150 MG 12 hr tablet, Take 1 tablet (150 mg total) by mouth 2 (two) times daily., Disp: 60 tablet, Rfl: 1   busPIRone (BUSPAR) 15 MG tablet, TAKE 1 TABLET BY MOUTH 2 TIMES A DAY, Disp: 180 tablet, Rfl: 0   DULoxetine (CYMBALTA) 60 MG capsule, TAKE 1 CAPSULE BY MOUTH 2 TIMES DAILY, Disp: 180 capsule, Rfl: 1   fenofibrate micronized (LOFIBRA) 134 MG capsule, TAKE 1 CAPSULE BY MOUTH ONCE DAILY, Disp: 90 capsule, Rfl: 1   FEROSUL 325 (65 Fe) MG tablet, TAKE 1 TABLET BY MOUTH 2 TIMES A DAY, Disp: 60 tablet, Rfl: 1   fluticasone furoate-vilanterol (BREO ELLIPTA) 200-25 MCG/ACT AEPB, Inhale 1 puff into the lungs daily., Disp: 1 each, Rfl: 11   furosemide (LASIX) 20 MG tablet, Take 1 tablet (20 mg total) by mouth daily., Disp: 30 tablet, Rfl: 3   gabapentin (NEURONTIN) 300 MG capsule, TAKE 3 CAPSULES BY MOUTH 2 TIMES DAILY, Disp: 180 capsule, Rfl: 2   lisinopril (ZESTRIL) 20 MG tablet, TAKE ONE TABLET BY MOUTH EACH DAY FOR BLOOD PRESSURE, Disp: 90 tablet, Rfl: 0   meloxicam (MOBIC) 7.5 MG tablet, TAKE 1 TABLET BY MOUTH EVERY DAY, Disp: 90 tablet, Rfl: 1   nicotine (NICODERM CQ - DOSED IN MG/24 HOURS) 21 mg/24hr patch, Use 1 patch qd for 28 days, Disp: 28 patch, Rfl: 0   omeprazole (PRILOSEC) 40 MG capsule, TAKE 1 CAPSULE BY MOUTH DAILY BEFORE A MEAL, Disp: 90 capsule, Rfl: 1   rosuvastatin (CRESTOR) 5 MG tablet, TAKE 1 TABLET BY MOUTH EVERY DAY FOR CHOLESTEROL, Disp: 90 tablet, Rfl: 1   Vitamin D, Ergocalciferol, (DRISDOL) 1.25 MG (50000 UNIT) CAPS capsule, Take 1 capsule (50,000 Units total) by mouth every 7 (seven) days., Disp: 5 capsule, Rfl: 5   tiZANidine (ZANAFLEX) 4 MG tablet, TAKE 1 TABLET BY MOUTH 2 TIMES A DAY AS NEEDED FOR MUSCLE SPASMS, Disp: 60 tablet, Rfl: 1   Allergies  Allergen Reactions    Amoxicillin Anaphylaxis, Itching and Swelling   Penicillins Itching and Swelling    Throat swelling Throat swelling    Codeine Other (See Comments)    nausea   Oxycodone Other (See Comments)    Hallucinations   Oxycodone Hcl Other (See Comments)    hallucinations  Oxycodone Hcl     ROS CONSTITUTIONAL: Negative for chills, fatigue, fever, unintentional weight gain and unintentional weight loss.   CARDIOVASCULAR: Negative for chest pain, RESPIRATORY: Negative for recent cough and dyspnea.  GASTROINTESTINAL: Negative for abdominal pain, acid reflux symptoms, constipation, diarrhea, nausea and vomiting.  MSK: see HPI     Objective:  PHYSICAL EXAM:   VS: BP 114/70 (BP Location: Left Arm, Patient Position: Sitting, Cuff Size: Normal)   Pulse 70   Temp (!) 97.2 F (36.2 C)   Ht 5' 1.5" (1.562 m)   Wt 167 lb 6.4 oz (75.9 kg)   SpO2 98%   BMI 31.12 kg/m   GEN: Well nourished, well developed, in no acute distress  Cardiac: RRR; no murmurs, Respiratory:  normal respiratory rate and pattern with no distress - normal breath sounds with no rales, rhonchi, wheezes or rubs MS: no deformity or atrophy - walks slow and slightly stooped Skin: warm and dry, no rash    Health Maintenance Due  Topic Date Due   Medicare Annual Wellness (AWV)  Never done   Zoster Vaccines- Shingrix (1 of 2) Never done   PAP SMEAR-Modifier  Never done   COLONOSCOPY (Pts 45-65yr Insurance coverage will need to be confirmed)  12/16/2019    There are no preventive care reminders to display for this patient.  Lab Results  Component Value Date   TSH 4.050 12/19/2021   Lab Results  Component Value Date   WBC 6.3 02/14/2022   HGB 13.0 02/14/2022   HCT 38.4 02/14/2022   MCV 89 02/14/2022   PLT 365 02/14/2022   Lab Results  Component Value Date   NA 139 02/14/2022   K 4.7 02/14/2022   CO2 16 (L) 02/14/2022   GLUCOSE 87 02/14/2022   BUN 20 02/14/2022   CREATININE 0.96 02/14/2022   BILITOT  <0.2 02/14/2022   ALKPHOS 91 02/14/2022   AST 21 02/14/2022   ALT 15 02/14/2022   PROT 7.5 02/14/2022   ALBUMIN 5.2 (H) 02/14/2022   CALCIUM 9.7 02/14/2022   EGFR 69 02/14/2022   Lab Results  Component Value Date   CHOL 141 12/19/2021   Lab Results  Component Value Date   HDL 52 12/19/2021   Lab Results  Component Value Date   LDLCALC 67 12/19/2021   Lab Results  Component Value Date   TRIG 127 12/19/2021   Lab Results  Component Value Date   CHOLHDL 2.7 12/19/2021   Lab Results  Component Value Date   HGBA1C 5.3 12/19/2021      Assessment & Plan:   Problem List Items Addressed This Visit       Musculoskeletal and Integument   DDD (degenerative disc disease), lumbosacral   Relevant Medications   tiZANidine (ZANAFLEX) 4 MG tablet Follow up with specialist as directed   Other Visit Diagnoses     Tobacco abuse    -  Primary   Relevant Medications   nicotine (NICODERM CQ - DOSED IN MG/24 HOURS) 21 mg/24hr patch Will taper over next several weeks       Meds ordered this encounter  Medications   tiZANidine (ZANAFLEX) 4 MG tablet    Sig: TAKE 1 TABLET BY MOUTH 2 TIMES A DAY AS NEEDED FOR MUSCLE SPASMS    Dispense:  60 tablet    Refill:  1    Order Specific Question:   Supervising Provider    Answer:   COX, KElnita Maxwell[[[id number]  nicotine (NICODERM CQ - DOSED IN  MG/24 HOURS) 21 mg/24hr patch    Sig: Use 1 patch qd for 28 days    Dispense:  28 patch    Refill:  0    Order Specific Question:   Supervising Provider    AnswerShelton Silvas    Follow-up: Return if symptoms worsen or fail to improve.    SARA R Livy Ross, PA-C

## 2022-05-03 DIAGNOSIS — M5116 Intervertebral disc disorders with radiculopathy, lumbar region: Secondary | ICD-10-CM | POA: Diagnosis not present

## 2022-05-03 DIAGNOSIS — M5440 Lumbago with sciatica, unspecified side: Secondary | ICD-10-CM | POA: Diagnosis not present

## 2022-05-03 DIAGNOSIS — M4726 Other spondylosis with radiculopathy, lumbar region: Secondary | ICD-10-CM | POA: Diagnosis not present

## 2022-05-03 DIAGNOSIS — M48061 Spinal stenosis, lumbar region without neurogenic claudication: Secondary | ICD-10-CM | POA: Diagnosis not present

## 2022-05-10 DIAGNOSIS — M5416 Radiculopathy, lumbar region: Secondary | ICD-10-CM | POA: Diagnosis not present

## 2022-05-11 ENCOUNTER — Other Ambulatory Visit (HOSPITAL_BASED_OUTPATIENT_CLINIC_OR_DEPARTMENT_OTHER): Payer: Self-pay | Admitting: Student

## 2022-05-11 DIAGNOSIS — M5416 Radiculopathy, lumbar region: Secondary | ICD-10-CM

## 2022-05-16 ENCOUNTER — Other Ambulatory Visit: Payer: Self-pay | Admitting: Physician Assistant

## 2022-05-16 DIAGNOSIS — F411 Generalized anxiety disorder: Secondary | ICD-10-CM

## 2022-05-19 ENCOUNTER — Ambulatory Visit (HOSPITAL_BASED_OUTPATIENT_CLINIC_OR_DEPARTMENT_OTHER): Payer: Medicare Other

## 2022-05-23 ENCOUNTER — Other Ambulatory Visit: Payer: Self-pay | Admitting: Physician Assistant

## 2022-05-24 ENCOUNTER — Ambulatory Visit (HOSPITAL_BASED_OUTPATIENT_CLINIC_OR_DEPARTMENT_OTHER)
Admission: RE | Admit: 2022-05-24 | Discharge: 2022-05-24 | Disposition: A | Discharge status: Home / Self Care | Payer: Medicare Other | Source: Ambulatory Visit | Attending: Student | Admitting: Student

## 2022-05-24 DIAGNOSIS — M5416 Radiculopathy, lumbar region: Secondary | ICD-10-CM | POA: Diagnosis not present

## 2022-05-24 DIAGNOSIS — M545 Low back pain, unspecified: Secondary | ICD-10-CM | POA: Diagnosis not present

## 2022-05-25 ENCOUNTER — Other Ambulatory Visit: Payer: Self-pay

## 2022-05-25 DIAGNOSIS — R600 Localized edema: Secondary | ICD-10-CM

## 2022-05-25 MED ORDER — FUROSEMIDE 20 MG PO TABS: 20.0000 mg | ORAL_TABLET | Freq: Every day | ORAL | 1 refills | Status: DC

## 2022-05-29 ENCOUNTER — Other Ambulatory Visit: Payer: Self-pay | Admitting: Physician Assistant

## 2022-05-30 ENCOUNTER — Encounter: Payer: Self-pay | Admitting: Physician Assistant

## 2022-05-30 ENCOUNTER — Ambulatory Visit (INDEPENDENT_AMBULATORY_CARE_PROVIDER_SITE_OTHER): Payer: Medicare Other | Admitting: Physician Assistant

## 2022-05-30 VITALS — BP 116/70 | HR 80 | Temp 97.3°F | Ht 61.5 in | Wt 174.0 lb

## 2022-05-30 DIAGNOSIS — D649 Anemia, unspecified: Secondary | ICD-10-CM

## 2022-05-30 DIAGNOSIS — Z720 Tobacco use: Secondary | ICD-10-CM

## 2022-05-30 DIAGNOSIS — M5137 Other intervertebral disc degeneration, lumbosacral region: Secondary | ICD-10-CM

## 2022-05-30 DIAGNOSIS — R5383 Other fatigue: Secondary | ICD-10-CM

## 2022-05-30 DIAGNOSIS — E559 Vitamin D deficiency, unspecified: Secondary | ICD-10-CM | POA: Diagnosis not present

## 2022-05-30 DIAGNOSIS — D508 Other iron deficiency anemias: Secondary | ICD-10-CM | POA: Diagnosis not present

## 2022-05-30 HISTORY — DX: Anemia, unspecified: D64.9

## 2022-05-30 HISTORY — DX: Other fatigue: R53.83

## 2022-05-30 HISTORY — DX: Tobacco use: Z72.0

## 2022-05-30 MED ORDER — VARENICLINE TARTRATE 0.5 MG PO TABS: 0.5000 mg | ORAL_TABLET | Freq: Two times a day (BID) | ORAL | 0 refills | Status: DC

## 2022-05-30 NOTE — Progress Notes (Signed)
Established Patient Office Visit  Subjective:  Patient ID: Kaitlyn Diaz, female    DOB: Aug 01, 1965  Age: 57 y.o. MRN: [id number]  CC:  Chief Complaint  Patient presents with   Back Pain    HPI [name] presents for back pain  Pt states that her neurosurgeon told her to come to our office 'to get her health straight' - unsure of exactly what that means Pt states that she is nervous and anxious in his office and bp has been elevated there but here our records show bp normal at last several visits She states that she has felt over the past 2 weeks that she is not walking straight and at times veers to the left.  She states that she is having a lot of lower back pain that radiates down her legs and feels weak at times in legs and 'all over' She denies any symptoms of dizziness or lightheadedness She also mentions that over the past 10 years she has noted trouble with picking up items and having decreased strength in both arms.  She has been evaluated by ortho in past and has had nerve conduction studies and says she was told she had some nerve impingement in her neck.  She would like to see that orthopedist again to further evaluate because she says her symptoms have worsened She has seen Dr Arrie Eastern and currently seeing neurosurgeon Dr Saintclair Halsted --- we do not have access to any of their records and advised pt to sign for those to review  Pt states in order to get the surgery on her back that she needs she does need to stop smoking.  We had discussed this at her last visit and she chose to try nicotine patches which are not working - she would like to try chantix instead  Past Medical History:  Diagnosis Date   Anemia    Anxiety    "panic attacks, claustophobic on xanax"   Arthritis    Asthma    Complication of anesthesia    Difficult to put to sleep, Has run fever, "runs in famly"   COPD (chronic obstructive pulmonary disease) ([place])    Enlarged heart    hx of   Essential  (primary) hypertension    Family history of anesthesia complication    malignant hyperthermia on mother's side of family   Generalized anxiety disorder    GERD (gastroesophageal reflux disease)    H/O hiatal hernia    hx of   Headache(784.0)    "due to Blood pressure"   History of melanoma    Hypertension    Dr. Lucita Lora, medical physician [phone]   Malignant hyperthermia    Mixed hyperlipidemia    Neuromuscular disorder ([place])    "85 % nerve damage in left leg"   Pneumonia    hx of pneumonia x 2   PONV (postoperative nausea and vomiting)    Spinal headache    Urinary tract infection    and kidney infection Hx of    Past Surgical History:  Procedure Laterality Date   ANTERIOR CERVICAL DECOMP/DISCECTOMY FUSION  12/28/2011   Procedure: ANTERIOR CERVICAL DECOMPRESSION/DISCECTOMY FUSION 2 LEVELS;  Surgeon: Elaina Hoops, MD;  Location: [facility];  Service: Neurosurgery;  Laterality: N/A;  HISTORY OF MALIGNANT HYPERTHERMIA Anterior Cervical Discectomy Fusion of Cervical five-six, Cervical six-seven   BLADDER SUSPENSION     BREAST SURGERY     left breast mass removed   hand mass  1994, multiple masses removed - "all benign"   leg mass     left leg  surgically removed   POSTERIOR FUSION LUMBAR SPINE     L4/5   TONSILLECTOMY     and adnoids   TUBAL LIGATION      Family History  Problem Relation Age of Onset   Colon cancer Mother    Stroke Maternal Grandmother    Stomach cancer Maternal Grandfather     Social History   Socioeconomic History   Marital status: Married    Spouse name: Not on file   Number of children: 2   Years of education: Not on file   Highest education level: Not on file  Occupational History   Occupation: Disabled  Tobacco Use   Smoking status: Every Day    Packs/day: 0.75    Years: 34.00    Total pack years: 25.50    Types: Cigarettes   Smokeless tobacco: Never  Vaping Use   Vaping Use: Never used  Substance and Sexual Activity    Alcohol use: Not Currently    Comment: Drinks occasionally, and consumes wine.   Drug use: No   Sexual activity: Never  Other Topics Concern   Not on file  Social History Narrative   Not on file   Social Determinants of Health   Financial Resource Strain: Not on file  Food Insecurity: Not on file  Transportation Needs: Not on file  Physical Activity: Not on file  Stress: Not on file  Social Connections: Not on file  Intimate Partner Violence: Not on file     Current Outpatient Medications:    albuterol (VENTOLIN HFA) 108 (90 Base) MCG/ACT inhaler, Inhale 1 puff into the lungs 3 (three) times daily as needed. For shortness of breath, Disp: 1 each, Rfl: 3   ALPRAZolam (XANAX) 1 MG tablet, TAKE 1 TABLET BY MOUTH 3 TIMES DAILY AS NEEDED FOR ANXIETY, Disp: 90 tablet, Rfl: 1   amLODipine (NORVASC) 10 MG tablet, TAKE 1 TABLET BY MOUTH EVERY DAY, Disp: 90 tablet, Rfl: 0   azelastine (ASTELIN) 0.1 % nasal spray, Place into both nostrils., Disp: , Rfl:    benzonatate (TESSALON) 100 MG capsule, TAKE 1 CAPSULE BY MOUTH THREE TIMES A DAY AS NEEDED FOR COUGH, Disp: 40 capsule, Rfl: 1   buPROPion (WELLBUTRIN SR) 150 MG 12 hr tablet, Take 1 tablet (150 mg total) by mouth 2 (two) times daily., Disp: 60 tablet, Rfl: 1   busPIRone (BUSPAR) 15 MG tablet, TAKE 1 TABLET BY MOUTH 2 TIMES A DAY, Disp: 180 tablet, Rfl: 0   DULoxetine (CYMBALTA) 60 MG capsule, TAKE 1 CAPSULE BY MOUTH 2 TIMES DAILY, Disp: 180 capsule, Rfl: 1   fenofibrate micronized (LOFIBRA) 134 MG capsule, TAKE 1 CAPSULE BY MOUTH ONCE DAILY, Disp: 90 capsule, Rfl: 1   FEROSUL 325 (65 Fe) MG tablet, TAKE 1 TABLET BY MOUTH 2 TIMES A DAY, Disp: 60 tablet, Rfl: 1   fluticasone furoate-vilanterol (BREO ELLIPTA) 200-25 MCG/ACT AEPB, Inhale 1 puff into the lungs daily., Disp: 1 each, Rfl: 11   furosemide (LASIX) 20 MG tablet, Take 1 tablet (20 mg total) by mouth daily., Disp: 90 tablet, Rfl: 1   gabapentin (NEURONTIN) 300 MG capsule, TAKE 3  CAPSULES BY MOUTH 2 TIMES DAILY, Disp: 180 capsule, Rfl: 2   lisinopril (ZESTRIL) 20 MG tablet, TAKE ONE TABLET BY MOUTH EACH DAY FOR BLOOD PRESSURE, Disp: 90 tablet, Rfl: 0   meloxicam (MOBIC) 7.5 MG tablet, TAKE 1 TABLET BY  MOUTH EVERY DAY, Disp: 90 tablet, Rfl: 1   omeprazole (PRILOSEC) 40 MG capsule, TAKE 1 CAPSULE BY MOUTH DAILY BEFORE A MEAL, Disp: 90 capsule, Rfl: 1   rosuvastatin (CRESTOR) 5 MG tablet, TAKE 1 TABLET BY MOUTH EVERY DAY FOR CHOLESTEROL, Disp: 90 tablet, Rfl: 1   tiZANidine (ZANAFLEX) 4 MG tablet, TAKE 1 TABLET BY MOUTH 2 TIMES A DAY AS NEEDED FOR MUSCLE SPASMS, Disp: 60 tablet, Rfl: 1   varenicline (CHANTIX) 0.5 MG tablet, Take 1 tablet (0.5 mg total) by mouth 2 (two) times daily., Disp: 60 tablet, Rfl: 0   Vitamin D, Ergocalciferol, (DRISDOL) 1.25 MG (50000 UNIT) CAPS capsule, Take 1 capsule (50,000 Units total) by mouth every 7 (seven) days., Disp: 5 capsule, Rfl: 5   Allergies  Allergen Reactions   Amoxicillin Anaphylaxis, Itching and Swelling   Penicillins Itching and Swelling    Throat swelling   Codeine Other (See Comments)    nausea   Nicotine Nausea And Vomiting   Oxycodone Other (See Comments)    Hallucinations   Oxycodone Hcl Other (See Comments)    hallucinations   Oxycodone Hcl     ROS CONSTITUTIONAL: has had fatigue and malaise - chronic for several months since her back pain worsened E/N/T: Negative for ear pain, nasal congestion and sore throat.  CARDIOVASCULAR: Negative for chest pain, dizziness, palpitations and pedal edema.  RESPIRATORY: Negative for recent cough and dyspnea.  GASTROINTESTINAL: Negative for abdominal pain, acid reflux symptoms, constipation, diarrhea, nausea and vomiting.  MSK: see HPI INTEGUMENTARY: Negative for rash.  NEUROLOGICAL: Negative for dizziness and headaches.         Objective:    PHYSICAL EXAM:   VS: BP 116/70 (BP Location: Left Arm, Patient Position: Sitting, Cuff Size: Normal)   Pulse 80   Temp (!)  97.3 F (36.3 C) (Temporal)   Ht 5' 1.5" (1.562 m)   Wt 174 lb (78.9 kg)   SpO2 98%   BMI 32.34 kg/m   GEN: Well nourished, well developed, in no acute distress - tearful Cardiac: RRR; no murmurs, rubs, or gallops,no edema - Respiratory:  normal respiratory rate and pattern with no distress - normal breath sounds with no rales, rhonchi, wheezes or rubs MS: no deformity or atrophy - walks slow and stooped slightly but otherwise normal gait Skin: warm and dry, no rash  Neuro:  Alert and Oriented x 3, Strength and sensation are intact - CN II-Xii grossly intact     Health Maintenance Due  Topic Date Due   Medicare Annual Wellness (AWV)  Never done   Zoster Vaccines- Shingrix (1 of 2) Never done   PAP SMEAR-Modifier  Never done   COLONOSCOPY (Pts 45-80yr Insurance coverage will need to be confirmed)  12/16/2019    There are no preventive care reminders to display for this patient.  Lab Results  Component Value Date   TSH 4.050 12/19/2021   Lab Results  Component Value Date   WBC 6.3 02/14/2022   HGB 13.0 02/14/2022   HCT 38.4 02/14/2022   MCV 89 02/14/2022   PLT 365 02/14/2022   Lab Results  Component Value Date   NA 139 02/14/2022   K 4.7 02/14/2022   CO2 16 (L) 02/14/2022   GLUCOSE 87 02/14/2022   BUN 20 02/14/2022   CREATININE 0.96 02/14/2022   BILITOT <0.2 02/14/2022   ALKPHOS 91 02/14/2022   AST 21 02/14/2022   ALT 15 02/14/2022   PROT 7.5 02/14/2022   ALBUMIN 5.2 (H)  02/14/2022   CALCIUM 9.7 02/14/2022   EGFR 69 02/14/2022   Lab Results  Component Value Date   CHOL 141 12/19/2021   Lab Results  Component Value Date   HDL 52 12/19/2021   Lab Results  Component Value Date   LDLCALC 67 12/19/2021   Lab Results  Component Value Date   TRIG 127 12/19/2021   Lab Results  Component Value Date   CHOLHDL 2.7 12/19/2021   Lab Results  Component Value Date   HGBA1C 5.3 12/19/2021      Assessment & Plan:   Problem List Items Addressed This  Visit       Musculoskeletal and Integument   DDD (degenerative disc disease), lumbosacral Follow up with ortho and neurosurgeon Sign for records     Other   Vitamin D deficiency   Relevant Orders   VITAMIN D 25 Hydroxy (Vit-D Deficiency, Fractures)   Other fatigue - Primary   Relevant Orders   CBC with Differential/Platelet   Comprehensive metabolic panel   123456 and Folate Panel   TSH   Absolute anemia   Relevant Orders   Iron, TIBC and Ferritin Panel   Tobacco abuse   Relevant Medications   varenicline (CHANTIX) 0.5 MG tablet    Meds ordered this encounter  Medications   varenicline (CHANTIX) 0.5 MG tablet    Sig: Take 1 tablet (0.5 mg total) by mouth 2 (two) times daily.    Dispense:  60 tablet    Refill:  0    Order Specific Question:   Supervising Provider    AnswerShelton Silvas    Follow-up: No follow-ups on file. - come for next scheduled chronic visit   SARA R Keyshawn Hellwig, PA-C

## 2022-05-31 LAB — IRON,TIBC AND FERRITIN PANEL
Ferritin: 92 ng/mL (ref 15–150)
Iron Saturation: 20 % (ref 15–55)
Iron: 92 ug/dL (ref 27–159)
Total Iron Binding Capacity: 459 ug/dL — ABNORMAL HIGH (ref 250–450)
UIBC: 367 ug/dL (ref 131–425)

## 2022-05-31 LAB — CBC WITH DIFFERENTIAL/PLATELET
Basophils Absolute: 0 10*3/uL (ref 0.0–0.2)
Basos: 1 %
EOS (ABSOLUTE): 0.1 10*3/uL (ref 0.0–0.4)
Eos: 2 %
Hematocrit: 33.9 % — ABNORMAL LOW (ref 34.0–46.6)
Hemoglobin: 11.3 g/dL (ref 11.1–15.9)
Immature Grans (Abs): 0.1 10*3/uL (ref 0.0–0.1)
Immature Granulocytes: 1 %
Lymphocytes Absolute: 1.1 10*3/uL (ref 0.7–3.1)
Lymphs: 14 %
MCH: 31.3 pg (ref 26.6–33.0)
MCHC: 33.3 g/dL (ref 31.5–35.7)
MCV: 94 fL (ref 79–97)
Monocytes Absolute: 0.6 10*3/uL (ref 0.1–0.9)
Monocytes: 7 %
Neutrophils Absolute: 5.9 10*3/uL (ref 1.4–7.0)
Neutrophils: 75 %
Platelets: 323 10*3/uL (ref 150–450)
RBC: 3.61 x10E6/uL — ABNORMAL LOW (ref 3.77–5.28)
RDW: 12.2 % (ref 11.7–15.4)
WBC: 7.8 10*3/uL (ref 3.4–10.8)

## 2022-05-31 LAB — COMPREHENSIVE METABOLIC PANEL
ALT: 21 IU/L (ref 0–32)
AST: 44 IU/L — ABNORMAL HIGH (ref 0–40)
Albumin/Globulin Ratio: 1.8 (ref 1.2–2.2)
Albumin: 4.4 g/dL (ref 3.8–4.9)
Alkaline Phosphatase: 83 IU/L (ref 44–121)
BUN/Creatinine Ratio: 12 (ref 9–23)
BUN: 10 mg/dL (ref 6–24)
Bilirubin Total: 0.2 mg/dL (ref 0.0–1.2)
CO2: 26 mmol/L (ref 20–29)
Calcium: 10 mg/dL (ref 8.7–10.2)
Chloride: 98 mmol/L (ref 96–106)
Creatinine, Ser: 0.86 mg/dL (ref 0.57–1.00)
Globulin, Total: 2.4 g/dL (ref 1.5–4.5)
Glucose: 84 mg/dL (ref 70–99)
Potassium: 5.2 mmol/L (ref 3.5–5.2)
Sodium: 142 mmol/L (ref 134–144)
Total Protein: 6.8 g/dL (ref 6.0–8.5)
eGFR: 78 mL/min/{1.73_m2} (ref >59)

## 2022-05-31 LAB — VITAMIN D 25 HYDROXY (VIT D DEFICIENCY, FRACTURES): Vit D, 25-Hydroxy: 32.2 ng/mL (ref 30.0–100.0)

## 2022-05-31 LAB — B12 AND FOLATE PANEL
Folate: 11.2 ng/mL (ref >3.0)
Vitamin B-12: 922 pg/mL (ref 232–1245)

## 2022-05-31 LAB — TSH: TSH: 3.17 u[IU]/mL (ref 0.450–4.500)

## 2022-06-07 DIAGNOSIS — M48062 Spinal stenosis, lumbar region with neurogenic claudication: Secondary | ICD-10-CM | POA: Diagnosis not present

## 2022-06-15 DIAGNOSIS — L570 Actinic keratosis: Secondary | ICD-10-CM | POA: Diagnosis not present

## 2022-06-15 DIAGNOSIS — L814 Other melanin hyperpigmentation: Secondary | ICD-10-CM | POA: Diagnosis not present

## 2022-06-15 DIAGNOSIS — L820 Inflamed seborrheic keratosis: Secondary | ICD-10-CM | POA: Diagnosis not present

## 2022-06-18 ENCOUNTER — Other Ambulatory Visit: Payer: Self-pay | Admitting: Physician Assistant

## 2022-06-18 MED ORDER — BENZONATATE 100 MG PO CAPS: ORAL_CAPSULE | ORAL | 1 refills | Status: DC

## 2022-06-18 MED ORDER — ROSUVASTATIN CALCIUM 5 MG PO TABS: ORAL_TABLET | ORAL | 1 refills | Status: DC

## 2022-06-19 ENCOUNTER — Other Ambulatory Visit: Payer: Self-pay | Admitting: Family Medicine

## 2022-06-20 ENCOUNTER — Other Ambulatory Visit: Payer: Self-pay | Admitting: Physician Assistant

## 2022-06-29 DIAGNOSIS — L820 Inflamed seborrheic keratosis: Secondary | ICD-10-CM | POA: Diagnosis not present

## 2022-06-29 DIAGNOSIS — L570 Actinic keratosis: Secondary | ICD-10-CM | POA: Diagnosis not present

## 2022-06-29 DIAGNOSIS — D2239 Melanocytic nevi of other parts of face: Secondary | ICD-10-CM | POA: Diagnosis not present

## 2022-07-10 ENCOUNTER — Other Ambulatory Visit: Payer: Self-pay | Admitting: Physician Assistant

## 2022-07-10 DIAGNOSIS — F411 Generalized anxiety disorder: Secondary | ICD-10-CM

## 2022-08-06 ENCOUNTER — Other Ambulatory Visit: Payer: Self-pay | Admitting: Physician Assistant

## 2022-08-06 DIAGNOSIS — F411 Generalized anxiety disorder: Secondary | ICD-10-CM

## 2022-08-07 ENCOUNTER — Encounter: Payer: Self-pay | Admitting: Physician Assistant

## 2022-08-14 ENCOUNTER — Other Ambulatory Visit: Payer: Self-pay | Admitting: Physician Assistant

## 2022-08-14 DIAGNOSIS — J438 Other emphysema: Secondary | ICD-10-CM

## 2022-08-22 ENCOUNTER — Other Ambulatory Visit: Payer: Self-pay

## 2022-08-22 DIAGNOSIS — Z1231 Encounter for screening mammogram for malignant neoplasm of breast: Secondary | ICD-10-CM

## 2022-08-23 ENCOUNTER — Other Ambulatory Visit: Payer: Self-pay | Admitting: Physician Assistant

## 2022-08-23 DIAGNOSIS — M4316 Spondylolisthesis, lumbar region: Secondary | ICD-10-CM | POA: Diagnosis not present

## 2022-08-23 DIAGNOSIS — M963 Postlaminectomy kyphosis: Secondary | ICD-10-CM | POA: Diagnosis not present

## 2022-08-23 DIAGNOSIS — M5432 Sciatica, left side: Secondary | ICD-10-CM | POA: Diagnosis not present

## 2022-08-27 ENCOUNTER — Other Ambulatory Visit: Payer: Self-pay

## 2022-08-27 DIAGNOSIS — R600 Localized edema: Secondary | ICD-10-CM

## 2022-08-27 MED ORDER — FUROSEMIDE 20 MG PO TABS: 20.0000 mg | ORAL_TABLET | Freq: Every day | ORAL | 0 refills | Status: DC

## 2022-08-28 ENCOUNTER — Other Ambulatory Visit: Payer: Self-pay | Admitting: Physician Assistant

## 2022-08-28 ENCOUNTER — Other Ambulatory Visit: Payer: Self-pay | Admitting: Family Medicine

## 2022-09-05 ENCOUNTER — Other Ambulatory Visit: Payer: Self-pay | Admitting: Physician Assistant

## 2022-09-05 DIAGNOSIS — F411 Generalized anxiety disorder: Secondary | ICD-10-CM

## 2022-09-06 DIAGNOSIS — L209 Atopic dermatitis, unspecified: Secondary | ICD-10-CM | POA: Diagnosis not present

## 2022-09-06 DIAGNOSIS — L299 Pruritus, unspecified: Secondary | ICD-10-CM | POA: Diagnosis not present

## 2022-09-10 DIAGNOSIS — M5416 Radiculopathy, lumbar region: Secondary | ICD-10-CM | POA: Diagnosis not present

## 2022-09-12 ENCOUNTER — Telehealth: Payer: Self-pay | Admitting: Physician Assistant

## 2022-09-12 ENCOUNTER — Inpatient Hospital Stay: Admission: RE | Admit: 2022-09-12 | Payer: Medicare Other | Source: Ambulatory Visit

## 2022-09-12 NOTE — Telephone Encounter (Signed)
MAMMOGRAM PEOPLE CALLED AND SAID Kaitlyn Diaz WAS A NO SHOW THIS MORNING BUT IF WE COULD GET HER TO RESCHEDULE HER APPT FOR TODAY OR IF SHE COULD GET HER MAMMOGRAM BEFORE 4PM TODAY THERE WONT BE A NO SHOW CHARGE. FYI I HAVE CALLED THE PT BUT NO ANSWER - LEFT VM

## 2022-09-13 ENCOUNTER — Telehealth: Payer: Self-pay

## 2022-09-13 NOTE — Telephone Encounter (Signed)
Patient called and left message stating that she is is having back surgery soon and that she is doing PT right now and that she is having trouble being off balance. She states that she has fallen 4 times in the past two weeks she states when she goes from sitting to standing or laying to standing she feels as if she is typically leaning to the left side most days but not purposely. She is wanting to schedule appointment for vertigo. Called patient and scheduled patient to come in to be seen.

## 2022-09-20 DIAGNOSIS — M5416 Radiculopathy, lumbar region: Secondary | ICD-10-CM | POA: Diagnosis not present

## 2022-09-24 ENCOUNTER — Telehealth: Payer: Self-pay

## 2022-09-24 ENCOUNTER — Ambulatory Visit: Payer: Medicare Other | Admitting: Physician Assistant

## 2022-09-24 NOTE — Telephone Encounter (Signed)
Pt called at 8:26 AM stating that she is unable to come today due to having PT at 3. The pt stated that she needs to keep this appt due to the upcoming back surgery. She is in a lot of pain.  Appointment has been rescheduled to tomorrow. Patient notified of office policy with cancellation/no shows.  Kennon Rounds notified.  Appointment has been marked as a no show.

## 2022-09-25 ENCOUNTER — Ambulatory Visit (INDEPENDENT_AMBULATORY_CARE_PROVIDER_SITE_OTHER): Payer: Medicare Other | Admitting: Physician Assistant

## 2022-09-25 ENCOUNTER — Encounter: Payer: Self-pay | Admitting: Physician Assistant

## 2022-09-25 VITALS — BP 128/92 | HR 77 | Temp 97.7°F | Resp 14 | Ht 61.5 in | Wt 165.0 lb

## 2022-09-25 DIAGNOSIS — R739 Hyperglycemia, unspecified: Secondary | ICD-10-CM

## 2022-09-25 DIAGNOSIS — I10 Essential (primary) hypertension: Secondary | ICD-10-CM

## 2022-09-25 DIAGNOSIS — M5442 Lumbago with sciatica, left side: Secondary | ICD-10-CM | POA: Diagnosis not present

## 2022-09-25 DIAGNOSIS — E559 Vitamin D deficiency, unspecified: Secondary | ICD-10-CM

## 2022-09-25 DIAGNOSIS — E782 Mixed hyperlipidemia: Secondary | ICD-10-CM | POA: Diagnosis not present

## 2022-09-25 DIAGNOSIS — G8929 Other chronic pain: Secondary | ICD-10-CM

## 2022-09-25 DIAGNOSIS — J449 Chronic obstructive pulmonary disease, unspecified: Secondary | ICD-10-CM | POA: Diagnosis not present

## 2022-09-25 DIAGNOSIS — D508 Other iron deficiency anemias: Secondary | ICD-10-CM | POA: Diagnosis not present

## 2022-09-25 DIAGNOSIS — M5137 Other intervertebral disc degeneration, lumbosacral region: Secondary | ICD-10-CM | POA: Diagnosis not present

## 2022-09-25 DIAGNOSIS — F411 Generalized anxiety disorder: Secondary | ICD-10-CM | POA: Diagnosis not present

## 2022-09-25 DIAGNOSIS — J438 Other emphysema: Secondary | ICD-10-CM

## 2022-09-25 MED ORDER — TRAMADOL HCL 50 MG PO TABS: 50.0000 mg | ORAL_TABLET | Freq: Two times a day (BID) | ORAL | 0 refills | Status: DC

## 2022-09-25 NOTE — Progress Notes (Signed)
Established Patient Office Visit  Subjective:  Patient ID: Kaitlyn Diaz, female    DOB: 05-13-1965  Age: 57 y.o. MRN: [id number]  CC:  Chief Complaint  Patient presents with   Chronic back pain Chronic follow up           HPI Kaitlyn Diaz presents for hypertension     Pt presents for follow up of hypertension.  She is tolerating the medication well without side effects.  Compliance with treatment has been good; she takes her medication as directed, maintains her diet and exercise regimen, and follows up as directed.  She is taking lisinopril 20mg  qd and norvasc 10mg  qd    Follow up of generalized anxiety disorder.  pt is doing well on her xanax, cymbalta , buspar and wellbutrin - currently doing well on medication and voices no concerns or problems  Pt presents with hyperlipidemia.  Compliance with treatment has been good; she maintains her low cholesterol diet, follows up as directed, and maintains her exercise regimen.  She denies experiencing any hypercholesterolemia related symptoms.  She is currently taking fenofibrate and crestor 5mg  qd   Pt with history of COPD - she is still smoking - she tried chantix but was not able to tolerate medication She uses albuterol prn and breo every day States she is not having any breathing issues at this time Defers lung CT  Pt with history of Vit D def - currently on weekly supplement  Pt with history of chronic low back pain - she had surgery many years ago for a fusion of L5/S1 -- she has a diagnosis now of DJD and severe stenosis at L4-L5 - She is following with Dr Precious Gilding at [facility].  She has been doing physical therapy with plan to get injections in spine then hopefully surgery.  She is taking lyrica and robaxin but requests pain medication as well She is having trouble with gait and balance due to her pain  Past Medical History:  Diagnosis Date   Anemia    Anxiety    "panic attacks, claustophobic on  xanax"   Arthritis    Asthma    Complication of anesthesia    Difficult to put to sleep, Has run fever, "runs in famly"   COPD (chronic obstructive pulmonary disease) (HCC)    Enlarged heart    hx of   Essential (primary) hypertension    Family history of anesthesia complication    malignant hyperthermia on mother's side of family   Generalized anxiety disorder    GERD (gastroesophageal reflux disease)    H/O hiatal hernia    hx of   Headache(784.0)    "due to Blood pressure"   History of melanoma    Hypertension    Dr. Syble Creek, medical physician [phone]   Malignant hyperthermia    Mixed hyperlipidemia    Neuromuscular disorder (HCC)    "85 % nerve damage in left leg"   Pneumonia    hx of pneumonia x 2   PONV (postoperative nausea and vomiting)    Spinal headache    Urinary tract infection    and kidney infection Hx of    Past Surgical History:  Procedure Laterality Date   ANTERIOR CERVICAL DECOMP/DISCECTOMY FUSION  12/28/2011   Procedure: ANTERIOR CERVICAL DECOMPRESSION/DISCECTOMY FUSION 2 LEVELS;  Surgeon: Mariam Dollar, MD;  Location: [facility];  Service: Neurosurgery;  Laterality: N/A;  HISTORY OF MALIGNANT HYPERTHERMIA Anterior Cervical Discectomy Fusion of Cervical  five-six, Cervical six-seven   BLADDER SUSPENSION     BREAST SURGERY     left breast mass removed   hand mass     1994, multiple masses removed - "all benign"   leg mass     left leg  surgically removed   POSTERIOR FUSION LUMBAR SPINE     L4/5   TONSILLECTOMY     and adnoids   TUBAL LIGATION      Family History  Problem Relation Age of Onset   Colon cancer Mother    Stroke Maternal Grandmother    Stomach cancer Maternal Grandfather     Social History   Socioeconomic History   Marital status: Married    Spouse name: Not on file   Number of children: 2   Years of education: Not on file   Highest education level: Not on file  Occupational History   Occupation: Disabled   Tobacco Use   Smoking status: Every Day    Packs/day: 0.75    Years: 34.00    Additional pack years: 0.00    Total pack years: 25.50    Types: Cigarettes   Smokeless tobacco: Never  Vaping Use   Vaping Use: Never used  Substance and Sexual Activity   Alcohol use: Not Currently    Comment: Drinks occasionally, and consumes wine.   Drug use: No   Sexual activity: Never  Other Topics Concern   Not on file  Social History Narrative   Not on file   Social Determinants of Health   Financial Resource Strain: Not on file  Food Insecurity: Not on file  Transportation Needs: Not on file  Physical Activity: Not on file  Stress: Not on file  Social Connections: Not on file  Intimate Partner Violence: Not on file     Current Outpatient Medications:    albuterol (VENTOLIN HFA) 108 (90 Base) MCG/ACT inhaler, Inhale 1 puff into the lungs 3 (three) times daily as needed. For shortness of breath, Disp: 1 each, Rfl: 3   ALPRAZolam (XANAX) 1 MG tablet, TAKE 1 TABLET BY MOUTH 3 TIMES DAILY AS NEEDED FOR ANXIETY, Disp: 90 tablet, Rfl: 0   amLODipine (NORVASC) 10 MG tablet, TAKE 1 TABLET BY MOUTH EVERY DAY, Disp: 90 tablet, Rfl: 0   azelastine (ASTELIN) 0.1 % nasal spray, Place into both nostrils., Disp: , Rfl:    benzonatate (TESSALON) 100 MG capsule, TAKE 1 CAPSULE BY MOUTH THREE TIMES A DAY AS NEEDED FOR COUGH, Disp: 40 capsule, Rfl: 1   buPROPion (WELLBUTRIN SR) 150 MG 12 hr tablet, Take 1 tablet (150 mg total) by mouth 2 (two) times daily., Disp: 60 tablet, Rfl: 1   busPIRone (BUSPAR) 15 MG tablet, TAKE 1 TABLET BY MOUTH 2 TIMES A DAY, Disp: 180 tablet, Rfl: 0   DULoxetine (CYMBALTA) 60 MG capsule, TAKE 1 CAPSULE BY MOUTH 2 TIMES DAILY, Disp: 180 capsule, Rfl: 1   fenofibrate micronized (LOFIBRA) 134 MG capsule, TAKE 1 CAPSULE BY MOUTH ONCE DAILY, Disp: 90 capsule, Rfl: 1   fluticasone furoate-vilanterol (BREO ELLIPTA) 200-25 MCG/ACT AEPB, INHALE 1 PUFF INTO LUNGS ONCE DAILY AS DIRECTED  (RINSE, GARGLE & SPIT AFTER USE), Disp: 1 each, Rfl: 2   furosemide (LASIX) 20 MG tablet, Take 1 tablet (20 mg total) by mouth daily., Disp: 90 tablet, Rfl: 0   lisinopril (ZESTRIL) 20 MG tablet, TAKE ONE TABLET BY MOUTH EACH DAY FOR BLOOD PRESSURE, Disp: 90 tablet, Rfl: 0   meloxicam (MOBIC) 7.5 MG tablet, TAKE 1  TABLET BY MOUTH EVERY DAY, Disp: 90 tablet, Rfl: 1   methocarbamol (ROBAXIN) 750 MG tablet, Take 750 mg by mouth 3 (three) times daily., Disp: , Rfl:    omeprazole (PRILOSEC) 40 MG capsule, TAKE 1 CAPSULE BY MOUTH DAILY BEFORE A MEAL, Disp: 90 capsule, Rfl: 1   pregabalin (LYRICA) 200 MG capsule, Take 200 mg by mouth 3 (three) times daily., Disp: , Rfl:    rosuvastatin (CRESTOR) 5 MG tablet, TAKE 1 TABLET BY MOUTH EVERY DAY FOR CHOLESTEROL, Disp: 90 tablet, Rfl: 1   traMADol (ULTRAM) 50 MG tablet, Take 1 tablet (50 mg total) by mouth 2 (two) times daily., Disp: 60 tablet, Rfl: 0   Vitamin D, Ergocalciferol, (DRISDOL) 1.25 MG (50000 UNIT) CAPS capsule, Take 1 capsule (50,000 Units total) by mouth every 7 (seven) days., Disp: 5 capsule, Rfl: 5   Allergies  Allergen Reactions   Amoxicillin Anaphylaxis, Itching and Swelling   Penicillins Itching and Swelling    Throat swelling   Codeine Other (See Comments)    nausea   Nicotine Nausea And Vomiting   Oxycodone Other (See Comments)    Hallucinations   Oxycodone Hcl Other (See Comments)    hallucinations   Oxycodone Hcl    CONSTITUTIONAL: Negative for chills, fatigue, fever, unintentional weight gain and unintentional weight loss.  E/N/T: Negative for ear pain, nasal congestion and sore throat.  CARDIOVASCULAR: Negative for chest pain, dizziness, palpitations and pedal edema.  RESPIRATORY: Negative for recent cough and dyspnea.  GASTROINTESTINAL: Negative for abdominal pain, acid reflux symptoms, constipation, diarrhea, nausea and vomiting.  MSK: see HPI INTEGUMENTARY: Negative for rash.  NEUROLOGICAL: Negative for dizziness and  headaches.  PSYCHIATRIC: Negative for sleep disturbance and to question depression screen.  Negative for depression, negative for anhedonia.            Objective:   PHYSICAL EXAM:   VS: BP (!) 128/92 (BP Location: Left Arm, Patient Position: Sitting, Cuff Size: Normal)   Pulse 77   Temp 97.7 F (36.5 C)   Resp 14   Ht 5' 1.5" (1.562 m)   Wt 165 lb (74.8 kg)   SpO2 99%   BMI 30.67 kg/m   GEN: Well nourished, well developed,- walks slowly and slightly stooped Cardiac: RRR; no murmurs, rubs, or gallops,no edema -  Respiratory:  normal respiratory rate and pattern with no distress - normal breath sounds with no rales, rhonchi, wheezes or rubs Skin: warm and dry, no rash  Neuro:  Alert and Oriented x 3,  CN II-Xii grossly intact Psych: euthymic mood, appropriate affect and demeanor  Health Maintenance Due  Topic Date Due   Medicare Annual Wellness (AWV)  Never done   Zoster Vaccines- Shingrix (1 of 2) Never done   Colonoscopy  12/16/2019   Lung Cancer Screening  07/19/2022   MAMMOGRAM  08/24/2022    There are no preventive care reminders to display for this patient.  Lab Results  Component Value Date   TSH 3.170 05/30/2022   Lab Results  Component Value Date   WBC 7.8 05/30/2022   HGB 11.3 05/30/2022   HCT 33.9 (L) 05/30/2022   MCV 94 05/30/2022   PLT 323 05/30/2022   Lab Results  Component Value Date   NA 142 05/30/2022   K 5.2 05/30/2022   CO2 26 05/30/2022   GLUCOSE 84 05/30/2022   BUN 10 05/30/2022   CREATININE 0.86 05/30/2022   BILITOT 0.2 05/30/2022   ALKPHOS 83 05/30/2022   AST 44 (H)  05/30/2022   ALT 21 05/30/2022   PROT 6.8 05/30/2022   ALBUMIN 4.4 05/30/2022   CALCIUM 10.0 05/30/2022   EGFR 78 05/30/2022   Lab Results  Component Value Date   CHOL 141 12/19/2021   Lab Results  Component Value Date   HDL 52 12/19/2021   Lab Results  Component Value Date   LDLCALC 67 12/19/2021   Lab Results  Component Value Date   TRIG 127  12/19/2021   Lab Results  Component Value Date   CHOLHDL 2.7 12/19/2021   Lab Results  Component Value Date   HGBA1C 5.3 12/19/2021      Assessment & Plan:   Problem List Items Addressed This Visit       Cardiovascular and Mediastinum   Benign essential hypertension - Primary   Relevant Orders   CBC with Differential/Platelet   Comprehensive metabolic panel   TSH Continue meds as directed                   Other   Mixed hyperlipidemia   Relevant Orders   Lipid panel Continue meds and watch diet   Generalized anxiety disorder  Continue meds Other Visit Diagnoses     Vitamin D deficiency       Relevant Orders   VITAMIN D 25 Hydroxy (Vit-D Deficiency, Fractures)   COPD Continue current inhalers Recommend stop smoking         Chronic low back pain with spinal stenosis Continue meds Rx for tramadol Continue PT Follow up with neurosurgeon as directed                               Meds ordered this encounter  Medications   traMADol (ULTRAM) 50 MG tablet    Sig: Take 1 tablet (50 mg total) by mouth 2 (two) times daily.    Dispense:  60 tablet    Refill:  0    Order Specific Question:   Supervising Provider    Answer:   Corey Harold    Follow-up: Return in about 6 weeks (around 11/06/2022) for follow-up.    SARA R Pride Gonzales, PA-C

## 2022-09-26 ENCOUNTER — Other Ambulatory Visit: Payer: Self-pay | Admitting: Physician Assistant

## 2022-09-26 DIAGNOSIS — D508 Other iron deficiency anemias: Secondary | ICD-10-CM

## 2022-09-26 DIAGNOSIS — M5416 Radiculopathy, lumbar region: Secondary | ICD-10-CM | POA: Diagnosis not present

## 2022-09-26 LAB — COMPREHENSIVE METABOLIC PANEL
ALT: 10 IU/L (ref 0–32)
AST: 20 IU/L (ref 0–40)
Albumin: 4.5 g/dL (ref 3.8–4.9)
Alkaline Phosphatase: 92 IU/L (ref 44–121)
BUN/Creatinine Ratio: 19 (ref 9–23)
BUN: 15 mg/dL (ref 6–24)
Bilirubin Total: <0.2 mg/dL (ref 0.0–1.2)
CO2: 21 mmol/L (ref 20–29)
Calcium: 9.7 mg/dL (ref 8.7–10.2)
Chloride: 104 mmol/L (ref 96–106)
Creatinine, Ser: 0.77 mg/dL (ref 0.57–1.00)
Globulin, Total: 2.7 g/dL (ref 1.5–4.5)
Glucose: 103 mg/dL — ABNORMAL HIGH (ref 70–99)
Potassium: 4.4 mmol/L (ref 3.5–5.2)
Sodium: 144 mmol/L (ref 134–144)
Total Protein: 7.2 g/dL (ref 6.0–8.5)
eGFR: 89 mL/min/{1.73_m2} (ref >59)

## 2022-09-26 LAB — TSH: TSH: 1.93 u[IU]/mL (ref 0.450–4.500)

## 2022-09-26 LAB — LIPID PANEL
Chol/HDL Ratio: 3.4 ratio (ref 0.0–4.4)
Cholesterol, Total: 141 mg/dL (ref 100–199)
HDL: 42 mg/dL (ref >39)
LDL Chol Calc (NIH): 76 mg/dL (ref 0–99)
Triglycerides: 126 mg/dL (ref 0–149)
VLDL Cholesterol Cal: 23 mg/dL (ref 5–40)

## 2022-09-26 LAB — IRON,TIBC AND FERRITIN PANEL
Ferritin: 39 ng/mL (ref 15–150)
Iron Saturation: 8 % — CL (ref 15–55)
Iron: 47 ug/dL (ref 27–159)
Total Iron Binding Capacity: 612 ug/dL (ref 250–450)
UIBC: 565 ug/dL — ABNORMAL HIGH (ref 131–425)

## 2022-09-26 LAB — CBC WITH DIFFERENTIAL/PLATELET
Basophils Absolute: 0 10*3/uL (ref 0.0–0.2)
Basos: 1 %
EOS (ABSOLUTE): 0.1 10*3/uL (ref 0.0–0.4)
Eos: 1 %
Hematocrit: 38.3 % (ref 34.0–46.6)
Hemoglobin: 11.8 g/dL (ref 11.1–15.9)
Immature Grans (Abs): 0.1 10*3/uL (ref 0.0–0.1)
Immature Granulocytes: 1 %
Lymphocytes Absolute: 1.1 10*3/uL (ref 0.7–3.1)
Lymphs: 17 %
MCH: 28.2 pg (ref 26.6–33.0)
MCHC: 30.8 g/dL — ABNORMAL LOW (ref 31.5–35.7)
MCV: 92 fL (ref 79–97)
Monocytes Absolute: 0.5 10*3/uL (ref 0.1–0.9)
Monocytes: 7 %
Neutrophils Absolute: 4.8 10*3/uL (ref 1.4–7.0)
Neutrophils: 73 %
Platelets: 323 10*3/uL (ref 150–450)
RBC: 4.18 x10E6/uL (ref 3.77–5.28)
RDW: 13.1 % (ref 11.7–15.4)
WBC: 6.5 10*3/uL (ref 3.4–10.8)

## 2022-09-26 LAB — HEMOGLOBIN A1C
Est. average glucose Bld gHb Est-mCnc: 103 mg/dL
Hgb A1c MFr Bld: 5.2 % (ref 4.8–5.6)

## 2022-09-26 LAB — VITAMIN D 25 HYDROXY (VIT D DEFICIENCY, FRACTURES): Vit D, 25-Hydroxy: 36.8 ng/mL (ref 30.0–100.0)

## 2022-09-26 MED ORDER — IRON (FERROUS SULFATE) 325 (65 FE) MG PO TABS
325.0000 mg | ORAL_TABLET | Freq: Every day | ORAL | 5 refills | Status: DC
Start: 2022-09-26 — End: 2022-12-25

## 2022-09-28 ENCOUNTER — Other Ambulatory Visit: Payer: Self-pay | Admitting: Physician Assistant

## 2022-09-28 DIAGNOSIS — E782 Mixed hyperlipidemia: Secondary | ICD-10-CM

## 2022-09-28 DIAGNOSIS — M5416 Radiculopathy, lumbar region: Secondary | ICD-10-CM | POA: Diagnosis not present

## 2022-10-02 DIAGNOSIS — M5416 Radiculopathy, lumbar region: Secondary | ICD-10-CM | POA: Diagnosis not present

## 2022-10-03 ENCOUNTER — Other Ambulatory Visit: Payer: Self-pay | Admitting: Physician Assistant

## 2022-10-03 DIAGNOSIS — D508 Other iron deficiency anemias: Secondary | ICD-10-CM

## 2022-10-04 DIAGNOSIS — M963 Postlaminectomy kyphosis: Secondary | ICD-10-CM | POA: Diagnosis not present

## 2022-10-04 DIAGNOSIS — M4316 Spondylolisthesis, lumbar region: Secondary | ICD-10-CM | POA: Diagnosis not present

## 2022-10-04 DIAGNOSIS — M5432 Sciatica, left side: Secondary | ICD-10-CM | POA: Diagnosis not present

## 2022-10-05 ENCOUNTER — Other Ambulatory Visit: Payer: Self-pay | Admitting: Physician Assistant

## 2022-10-05 DIAGNOSIS — F411 Generalized anxiety disorder: Secondary | ICD-10-CM

## 2022-10-05 DIAGNOSIS — M5416 Radiculopathy, lumbar region: Secondary | ICD-10-CM | POA: Diagnosis not present

## 2022-10-08 DIAGNOSIS — R200 Anesthesia of skin: Secondary | ICD-10-CM

## 2022-10-08 DIAGNOSIS — M47812 Spondylosis without myelopathy or radiculopathy, cervical region: Secondary | ICD-10-CM | POA: Diagnosis not present

## 2022-10-08 DIAGNOSIS — M5030 Other cervical disc degeneration, unspecified cervical region: Secondary | ICD-10-CM | POA: Diagnosis not present

## 2022-10-08 DIAGNOSIS — M5412 Radiculopathy, cervical region: Secondary | ICD-10-CM | POA: Diagnosis not present

## 2022-10-08 HISTORY — DX: Anesthesia of skin: R20.0

## 2022-10-15 ENCOUNTER — Other Ambulatory Visit (INDEPENDENT_AMBULATORY_CARE_PROVIDER_SITE_OTHER): Payer: Medicare Other

## 2022-10-15 ENCOUNTER — Ambulatory Visit: Payer: Medicare Other

## 2022-10-15 DIAGNOSIS — D508 Other iron deficiency anemias: Secondary | ICD-10-CM

## 2022-10-15 LAB — POC HEMOCCULT BLD/STL (HOME/3-CARD/SCREEN)
Card #2 Fecal Occult Blod, POC: NEGATIVE
Card #3 Fecal Occult Blood, POC: NEGATIVE
Fecal Occult Blood, POC: NEGATIVE

## 2022-10-15 NOTE — Progress Notes (Signed)
Patient  hemoccult cards results was all negative.

## 2022-10-16 DIAGNOSIS — M5416 Radiculopathy, lumbar region: Secondary | ICD-10-CM | POA: Diagnosis not present

## 2022-10-17 ENCOUNTER — Ambulatory Visit
Admission: RE | Admit: 2022-10-17 | Discharge: 2022-10-17 | Disposition: A | Discharge status: Home / Self Care | Payer: Medicare Other | Source: Ambulatory Visit | Attending: Physician Assistant | Admitting: Physician Assistant

## 2022-10-17 DIAGNOSIS — Z1231 Encounter for screening mammogram for malignant neoplasm of breast: Secondary | ICD-10-CM | POA: Diagnosis not present

## 2022-10-18 ENCOUNTER — Telehealth: Payer: Self-pay

## 2022-10-18 DIAGNOSIS — L209 Atopic dermatitis, unspecified: Secondary | ICD-10-CM | POA: Diagnosis not present

## 2022-10-18 DIAGNOSIS — R233 Spontaneous ecchymoses: Secondary | ICD-10-CM | POA: Diagnosis not present

## 2022-10-18 NOTE — Telephone Encounter (Signed)
Patient was informed of her negative results on her hemoccult cards, and the patient asked if we could send a message to you about her Iron. I did inform the patient that you would not be back until Monday. She states that it is hard for her to take the iron pills because they are so big, and she states she had mentioned speaking with you about iron infusions. She was wanting to know if this would be an option or not. Please advise.

## 2022-10-18 NOTE — Telephone Encounter (Signed)
Call pt and notify she needs to come in next week to repeat cbc and iron studies She has referral to GI - when is her appt Can also refer to hematology to see about iron infusion treatment -- who does she want to see?

## 2022-10-19 DIAGNOSIS — M5416 Radiculopathy, lumbar region: Secondary | ICD-10-CM | POA: Diagnosis not present

## 2022-10-20 ENCOUNTER — Other Ambulatory Visit: Payer: Self-pay | Admitting: Physician Assistant

## 2022-10-20 DIAGNOSIS — M5137 Other intervertebral disc degeneration, lumbosacral region: Secondary | ICD-10-CM

## 2022-10-20 DIAGNOSIS — G8929 Other chronic pain: Secondary | ICD-10-CM

## 2022-10-22 NOTE — Telephone Encounter (Signed)
Called patient and left voicemail for patient to call office.

## 2022-10-23 DIAGNOSIS — M5416 Radiculopathy, lumbar region: Secondary | ICD-10-CM | POA: Diagnosis not present

## 2022-10-25 ENCOUNTER — Telehealth: Payer: Self-pay

## 2022-10-25 DIAGNOSIS — M5416 Radiculopathy, lumbar region: Secondary | ICD-10-CM | POA: Diagnosis not present

## 2022-10-25 NOTE — Telephone Encounter (Signed)
Patient called wanting to schedule an appointment, then phone call was transferred to me because patient wants to schedule to come in to talk with you about getting iron infusions. We dont have any available appointments today, tomorrow, Monday, and there are only 2 or 3 same day appointments. Please advise scheduling for this patient and where and when she came come in to discuss this with you.

## 2022-10-25 NOTE — Telephone Encounter (Signed)
Pt had called last week about this same issue --- a message was left for her to call us back --- she needs to come in and repeat her cbc and iron studies - order is in She is scheduled with GI - ask when her appt is I had said we could refer to hematology for iron infusions and had asked where would she like to be referred ? In the meantime please try to continue oral iron therapy

## 2022-10-26 DIAGNOSIS — M5416 Radiculopathy, lumbar region: Secondary | ICD-10-CM | POA: Diagnosis not present

## 2022-10-26 DIAGNOSIS — M963 Postlaminectomy kyphosis: Secondary | ICD-10-CM | POA: Diagnosis not present

## 2022-10-26 DIAGNOSIS — M4316 Spondylolisthesis, lumbar region: Secondary | ICD-10-CM | POA: Diagnosis not present

## 2022-10-26 NOTE — Telephone Encounter (Signed)
Left detailed message informing patient of response and told the patient to call us back to get scheduled to come in for the lab work and to let us know who she would like to see with hematology and also asked her when her appointment is scheduled with GI and to call us back to let us know.

## 2022-10-30 DIAGNOSIS — M5416 Radiculopathy, lumbar region: Secondary | ICD-10-CM | POA: Diagnosis not present

## 2022-11-06 ENCOUNTER — Ambulatory Visit (INDEPENDENT_AMBULATORY_CARE_PROVIDER_SITE_OTHER): Payer: Medicare Other | Admitting: Physician Assistant

## 2022-11-06 ENCOUNTER — Encounter: Payer: Self-pay | Admitting: Physician Assistant

## 2022-11-06 VITALS — BP 124/80 | HR 80 | Temp 97.7°F | Ht 61.5 in | Wt 172.6 lb

## 2022-11-06 DIAGNOSIS — D508 Other iron deficiency anemias: Secondary | ICD-10-CM | POA: Diagnosis not present

## 2022-11-06 DIAGNOSIS — R233 Spontaneous ecchymoses: Secondary | ICD-10-CM

## 2022-11-06 DIAGNOSIS — M5137 Other intervertebral disc degeneration, lumbosacral region: Secondary | ICD-10-CM | POA: Diagnosis not present

## 2022-11-06 HISTORY — DX: Spontaneous ecchymoses: R23.3

## 2022-11-06 NOTE — Progress Notes (Signed)
Subjective:  Patient ID: Kaitlyn Diaz, female    DOB: [birth year]  Age: 57 y.o. MRN: [id number]  Chief Complaint  Patient presents with   Anemia    HPI Pt here for recheck of anemia.  She has been diagnosed with iron def anemia and is currently taking iron supplements bid.  She states that she would like to see about being referred to hematology for iron infusions because she is not tolerating the oral supplement.  She is due to recheck labwork and if still low iron/hgb will make referral Pt also has been advised to follow up with GI- we have made 2 separate referrals for her already but the prior offices she has been are not accepting her back as a patient.  Will need new referral  Pt noticed this morning that she has bruising on her abdomen.  She does not recall any specific injury or trauma.  She does note easy bruising on arms as well  Pt did see specialist about her chronic back pain and had injections done a few weeks ago - states has been helping with her pain She has also been going to physical therapy     11/06/2022   10:41 AM 06/12/2021   11:40 AM 11/22/2020    1:39 PM  Depression screen PHQ 2/9  Decreased Interest 2 2 0  Down, Depressed, Hopeless 2 3 0  PHQ - 2 Score 4 5 0  Altered sleeping 3 3   Tired, decreased energy 3 3   Change in appetite 0 0   Feeling bad or failure about yourself  2 3   Trouble concentrating 0 0   Moving slowly or fidgety/restless 0 0   Suicidal thoughts 0 0   PHQ-9 Score 12 14         03/23/2021    9:32 AM 06/12/2021   11:39 AM 05/02/2022    1:28 PM 11/06/2022   10:40 AM  Fall Risk  Falls in the past year? 0 0 0 0  Was there an injury with Fall? 0 0 0 0  Fall Risk Category Calculator 0 0 0 0  Fall Risk Category (Retired) Low Low    (RETIRED) Patient Fall Risk Level Low fall risk Low fall risk    Patient at Risk for Falls Due to No Fall Risks  No Fall Risks No Fall Risks  Fall risk Follow up   Falls evaluation completed Falls evaluation  completed     ROS CONSTITUTIONAL: Negative for chills, fatigue, fever, unintentional weight gain and unintentional weight loss.   CARDIOVASCULAR: Negative for chest pain, dizziness, palpitations and pedal edema.  RESPIRATORY: Negative for recent cough and dyspnea.  GASTROINTESTINAL: Negative for abdominal pain, acid reflux symptoms, constipation, diarrhea, nausea and vomiting.  MSK: see HPI INTEGUMENTARY - see HPI    Current Outpatient Medications:    albuterol (VENTOLIN HFA) 108 (90 Base) MCG/ACT inhaler, Inhale 1 puff into the lungs 3 (three) times daily as needed. For shortness of breath, Disp: 1 each, Rfl: 3   ALPRAZolam (XANAX) 1 MG tablet, TAKE 1 TABLET BY MOUTH 3 TIMES DAILY AS NEEDED FOR ANXIETY, Disp: 90 tablet, Rfl: 1   amLODipine (NORVASC) 10 MG tablet, TAKE 1 TABLET BY MOUTH EVERY DAY, Disp: 90 tablet, Rfl: 0   azelastine (ASTELIN) 0.1 % nasal spray, Place into both nostrils., Disp: , Rfl:    benzonatate (TESSALON) 100 MG capsule, TAKE 1 CAPSULE BY MOUTH THREE TIMES A DAY AS NEEDED FOR COUGH, Disp: 40  capsule, Rfl: 1   buPROPion (WELLBUTRIN SR) 150 MG 12 hr tablet, Take 1 tablet (150 mg total) by mouth 2 (two) times daily., Disp: 60 tablet, Rfl: 1   busPIRone (BUSPAR) 15 MG tablet, TAKE 1 TABLET BY MOUTH 2 TIMES A DAY, Disp: 180 tablet, Rfl: 0   clobetasol cream (TEMOVATE) 0.05 %, SMARTSIG:2 Topical Twice Daily, Disp: , Rfl:    DULoxetine (CYMBALTA) 60 MG capsule, TAKE 1 CAPSULE BY MOUTH 2 TIMES DAILY, Disp: 180 capsule, Rfl: 1   fenofibrate micronized (LOFIBRA) 134 MG capsule, TAKE 1 CAPSULE BY MOUTH ONCE DAILY, Disp: 90 capsule, Rfl: 1   fluticasone furoate-vilanterol (BREO ELLIPTA) 200-25 MCG/ACT AEPB, INHALE 1 PUFF INTO LUNGS ONCE DAILY AS DIRECTED (RINSE, GARGLE & SPIT AFTER USE), Disp: 1 each, Rfl: 2   furosemide (LASIX) 20 MG tablet, Take 1 tablet (20 mg total) by mouth daily., Disp: 90 tablet, Rfl: 0   Iron, Ferrous Sulfate, 325 (65 Fe) MG TABS, Take 325 mg by mouth  daily., Disp: 30 tablet, Rfl: 5   lisinopril (ZESTRIL) 20 MG tablet, TAKE ONE TABLET BY MOUTH EACH DAY FOR BLOOD PRESSURE, Disp: 90 tablet, Rfl: 0   meloxicam (MOBIC) 7.5 MG tablet, TAKE 1 TABLET BY MOUTH EVERY DAY, Disp: 90 tablet, Rfl: 1   methocarbamol (ROBAXIN) 750 MG tablet, Take 750 mg by mouth 3 (three) times daily., Disp: , Rfl:    mupirocin ointment (BACTROBAN) 2 %, Apply 1 Application topically 3 (three) times daily., Disp: , Rfl:    omeprazole (PRILOSEC) 40 MG capsule, TAKE 1 CAPSULE BY MOUTH DAILY BEFORE A MEAL, Disp: 90 capsule, Rfl: 1   pregabalin (LYRICA) 200 MG capsule, Take 200 mg by mouth 3 (three) times daily., Disp: , Rfl:    rosuvastatin (CRESTOR) 5 MG tablet, TAKE 1 TABLET BY MOUTH EVERY DAY FOR CHOLESTEROL, Disp: 90 tablet, Rfl: 1   traMADol (ULTRAM) 50 MG tablet, Take 1 tablet (50 mg total) by mouth 2 (two) times daily., Disp: 60 tablet, Rfl: 0   Vitamin D, Ergocalciferol, (DRISDOL) 1.25 MG (50000 UNIT) CAPS capsule, Take 1 capsule (50,000 Units total) by mouth every 7 (seven) days., Disp: 5 capsule, Rfl: 5  Past Medical History:  Diagnosis Date   Anemia    Anxiety    "panic attacks, claustophobic on xanax"   Arthritis    Asthma    Complication of anesthesia    Difficult to put to sleep, Has run fever, "runs in famly"   COPD (chronic obstructive pulmonary disease) (HCC)    Enlarged heart    hx of   Essential (primary) hypertension    Family history of anesthesia complication    malignant hyperthermia on mother's side of family   Generalized anxiety disorder    GERD (gastroesophageal reflux disease)    H/O hiatal hernia    hx of   Headache(784.0)    "due to Blood pressure"   History of melanoma    Hypertension    Dr. Syble Creek, medical physician [phone]   Malignant hyperthermia    Mixed hyperlipidemia    Neuromuscular disorder (HCC)    "85 % nerve damage in left leg"   Pneumonia    hx of pneumonia x 2   PONV (postoperative nausea and vomiting)     Spinal headache    Urinary tract infection    and kidney infection Hx of   Objective:  PHYSICAL EXAM:   BP 124/80 (BP Location: Left Arm, Patient Position: Sitting, Cuff Size: Normal)   Pulse  80   Temp 97.7 F (36.5 C) (Temporal)   Ht 5' 1.5" (1.562 m)   Wt 172 lb 9.6 oz (78.3 kg)   SpO2 98%   BMI 32.08 kg/m    GEN: Well nourished, well developed, in no acute distress  Cardiac: RRR; no murmurs, rubs, or gallops,no edema -  Respiratory:  normal respiratory rate and pattern with no distress - normal breath sounds with no rales, rhonchi, wheezes or rubs  Skin: warm and dry, no rash   Psych: euthymic mood, appropriate affect and demeanor  Assessment & Plan:    Other iron deficiency anemia -     CBC with Differential/Platelet -     Iron, TIBC and Ferritin Panel -     Ambulatory referral to Gastroenterology  Abnormal bruising -     CBC with Differential/Platelet -     Protime-INR  DDD (degenerative disc disease), lumbosacral Continue current meds Follow up with ortho and physical therapy    Follow-up: Return in about 2 months (around 01/07/2023) for chronic fasting follow-up - 40 min.  An After Visit Summary was printed and given to the patient.  Jettie Pagan [facility] [phone]

## 2022-11-07 ENCOUNTER — Other Ambulatory Visit: Payer: Self-pay | Admitting: Physician Assistant

## 2022-11-07 DIAGNOSIS — D508 Other iron deficiency anemias: Secondary | ICD-10-CM

## 2022-11-08 DIAGNOSIS — M5412 Radiculopathy, cervical region: Secondary | ICD-10-CM | POA: Diagnosis not present

## 2022-11-08 DIAGNOSIS — S8012XA Contusion of left lower leg, initial encounter: Secondary | ICD-10-CM | POA: Insufficient documentation

## 2022-11-08 HISTORY — DX: Contusion of left lower leg, initial encounter: S80.12XA

## 2022-11-13 ENCOUNTER — Other Ambulatory Visit: Payer: Self-pay | Admitting: Physician Assistant

## 2022-11-13 DIAGNOSIS — R600 Localized edema: Secondary | ICD-10-CM

## 2022-11-14 ENCOUNTER — Inpatient Hospital Stay: Payer: Medicare Other

## 2022-11-14 ENCOUNTER — Telehealth: Payer: Self-pay | Admitting: Hematology and Oncology

## 2022-11-14 ENCOUNTER — Inpatient Hospital Stay: Payer: Medicare Other | Attending: Hematology and Oncology | Admitting: Hematology and Oncology

## 2022-11-14 ENCOUNTER — Encounter: Payer: Self-pay | Admitting: Hematology and Oncology

## 2022-11-14 VITALS — BP 143/81 | HR 67 | Temp 98.6°F | Resp 18 | Ht 61.5 in | Wt 172.5 lb

## 2022-11-14 DIAGNOSIS — I10 Essential (primary) hypertension: Secondary | ICD-10-CM

## 2022-11-14 DIAGNOSIS — D509 Iron deficiency anemia, unspecified: Secondary | ICD-10-CM | POA: Insufficient documentation

## 2022-11-14 DIAGNOSIS — F17210 Nicotine dependence, cigarettes, uncomplicated: Secondary | ICD-10-CM | POA: Insufficient documentation

## 2022-11-14 DIAGNOSIS — Z800 Family history of malignant neoplasm of digestive organs: Secondary | ICD-10-CM | POA: Insufficient documentation

## 2022-11-14 DIAGNOSIS — K5900 Constipation, unspecified: Secondary | ICD-10-CM | POA: Diagnosis not present

## 2022-11-14 DIAGNOSIS — D649 Anemia, unspecified: Secondary | ICD-10-CM

## 2022-11-14 LAB — CMP (CANCER CENTER ONLY)
ALT: 13 U/L (ref 0–44)
AST: 15 U/L (ref 15–41)
Albumin: 3.8 g/dL (ref 3.5–5.0)
Alkaline Phosphatase: 67 U/L (ref 38–126)
Anion gap: 11 (ref 5–15)
BUN: 8 mg/dL (ref 6–20)
CO2: 28 mmol/L (ref 22–32)
Calcium: 9.2 mg/dL (ref 8.9–10.3)
Chloride: 101 mmol/L (ref 98–111)
Creatinine: 0.62 mg/dL (ref 0.44–1.00)
GFR, Estimated: >60 mL/min (ref >60)
Glucose, Bld: 98 mg/dL (ref 70–99)
Potassium: 3 mmol/L — ABNORMAL LOW (ref 3.5–5.1)
Sodium: 140 mmol/L (ref 135–145)
Total Bilirubin: 0.6 mg/dL (ref 0.3–1.2)
Total Protein: 7.8 g/dL (ref 6.5–8.1)

## 2022-11-14 LAB — CBC WITH DIFFERENTIAL (CANCER CENTER ONLY)
Abs Immature Granulocytes: 0.07 10*3/uL (ref 0.00–0.07)
Basophils Absolute: 0.1 10*3/uL (ref 0.0–0.1)
Basophils Relative: 1 %
Eosinophils Absolute: 0.1 10*3/uL (ref 0.0–0.5)
Eosinophils Relative: 1 %
HCT: 34.5 % — ABNORMAL LOW (ref 36.0–46.0)
Hemoglobin: 10.7 g/dL — ABNORMAL LOW (ref 12.0–15.0)
Immature Granulocytes: 1 %
Lymphocytes Relative: 15 %
Lymphs Abs: 1 10*3/uL (ref 0.7–4.0)
MCH: 29.1 pg (ref 26.0–34.0)
MCHC: 31 g/dL (ref 30.0–36.0)
MCV: 93.8 fL (ref 80.0–100.0)
Monocytes Absolute: 0.4 10*3/uL (ref 0.1–1.0)
Monocytes Relative: 5 %
Neutro Abs: 5.1 10*3/uL (ref 1.7–7.7)
Neutrophils Relative %: 77 %
Platelet Count: 338 10*3/uL (ref 150–400)
RBC: 3.68 MIL/uL — ABNORMAL LOW (ref 3.87–5.11)
RDW: 14.5 % (ref 11.5–15.5)
WBC Count: 6.6 10*3/uL (ref 4.0–10.5)
nRBC: 0 % (ref 0.0–0.2)

## 2022-11-14 LAB — DIRECT ANTIGLOBULIN TEST (NOT AT ARMC)
DAT, IgG: NEGATIVE
DAT, complement: NEGATIVE

## 2022-11-14 LAB — FERRITIN: Ferritin: 47 ng/mL (ref 11–307)

## 2022-11-14 LAB — RETICULOCYTES
Immature Retic Fract: 31.5 % — ABNORMAL HIGH (ref 2.3–15.9)
RBC.: 3.72 MIL/uL — ABNORMAL LOW (ref 3.87–5.11)
Retic Count, Absolute: 87.4 10*3/uL (ref 19.0–186.0)
Retic Ct Pct: 2.4 % (ref 0.4–3.1)

## 2022-11-14 LAB — VITAMIN B12: Vitamin B-12: 460 pg/mL (ref 180–914)

## 2022-11-14 LAB — FOLATE: Folate: 5.6 ng/mL — ABNORMAL LOW (ref >5.9)

## 2022-11-14 NOTE — Progress Notes (Signed)
[facility] [address] [place],  [place]  [address] [phone]  Clinic Day:  11/14/2022  Referring physician: Marianne Sofia, PA-C   REASON FOR CONSULTATION:  Iron deficiency anemia  HISTORY OF PRESENT ILLNESS:  Kaitlyn Diaz is a 57 y.o. female with a history of iron deficiency anemia who is referred in consultation by Kaitlyn Sofia, PA-C for assessment and management.  She was found to be iron deficient in September 2023, at which time CBC revealed hemoglobin of 10.8 with an MCV of 91.  White count was 8 with a normal differential.  Platelets 310,000 ferritin was 14.  B12 and folate were normal.  She was placed on oral iron supplementation twice daily.  In November her hemoglobin improved to 13 with an MCV of 89 and ferritin 40.  In February her hemoglobin had dropped down to 11.3 with an MCV of 94, but ferritin remained 92.  B12 and folate were normal again at that time.  In June, her hemoglobin was 11.8 with an MCV of 92 and ferritin 39.  In July her hemoglobin was 10.9 with an MCV of 89 and ferritin of 81.  The patient requested referral here for consideration of IV iron.  She reports fatigue and pica to ice.  She reports intermittent mild shortness of breath.  She denies lightheadedness.  She reports easy bruising, but no abnormal bleeding.  She denies nosebleeds, gum bleeds, hematuria, vaginal bleeding or hematochezia.  She states her stools are dark.  Stool Hemoccults x 3 in July were negative.  She states she has been compliant with the iron and tolerating this well, except for constipation.  She is on meloxicam 7.5 mg daily but denies other NSAID use.  She states EGD colonoscopy in [place] in 2021 were negative, but I do not have the report. She had not been able to follow-up with GI on her own, but has been referred back to GI by Kaitlyn Diaz.  The patient states she received a phone call from the GI office in [facility] and needs to call back to schedule  an appointment.  Medical history: Hypertension, hyperlipidemia, COPD, hiatal hernia, GERD, essential tremor, osteoarthritis degenerative disc disease, nerve damage of the left leg, and anxiety.  History of positive ANA previously evaluated by rheumatology.  She gives a history of melanoma of the right proximal arm, as well as treatment of multiple other skin lesions.  Status post lumbar and cervical fusion.  She had screening mammogram in July that did not reveal any evidence of malignancy.  She is overdue for CT lung cancer screening (last done in March 2023).  Last pelvic examination and Pap smear was in April 2022 with normal Pap.  Social history: Current cigarette smoker having smoked three quarters of a pack of cigarettes 34 years.  She states she is trying to quit, but could not use Chantix or nicotine patch.  She rarely drinks alcohol and denies previous heavy use.  She denies other substance use.  She is married with 2 children.  She is disabled since lumbar fusion caused nerve damage to the left leg.  Family history: Her mother had colon cancer at age 88.  Her maternal grandfather had stomach cancer over age 43.   REVIEW OF SYSTEMS:  Review of Systems  Constitutional:  Positive for fatigue. Negative for appetite change, chills, fever and unexpected weight change.  HENT:   Negative for lump/mass, mouth sores and sore throat.   Respiratory:  Negative for cough, hemoptysis and shortness of breath.   Cardiovascular:  Negative for chest pain and leg swelling.  Gastrointestinal:  Positive for constipation. Negative for abdominal pain, blood in stool, diarrhea, nausea and vomiting.  Genitourinary:  Negative for difficulty urinating, dysuria, frequency and hematuria.   Musculoskeletal:  Positive for back pain (Lower back) and neck pain. Negative for arthralgias and myalgias.  Skin:  Negative for rash.  Neurological:  Negative for dizziness and headaches.  Hematological:  Negative for adenopathy.  Bruises/bleeds easily (Easy bruising).  Psychiatric/Behavioral:  Positive for depression and sleep disturbance. The patient is nervous/anxious.      VITALS:  Blood pressure (!) 143/81, pulse 67, temperature 98.6 F (37 C), temperature source Oral, resp. rate 18, height 5' 1.5" (1.562 m), weight 172 lb 8 oz (78.2 kg), last menstrual period 06/03/2011, SpO2 98%.  Wt Readings from Last 3 Encounters:  11/14/22 172 lb 8 oz (78.2 kg)  11/06/22 172 lb 9.6 oz (78.3 kg)  09/25/22 165 lb (74.8 kg)    Body mass index is 32.07 kg/m.  Performance status (ECOG): 1 - Symptomatic but completely ambulatory  PHYSICAL EXAM:  Physical Exam Vitals and nursing note reviewed.  Constitutional:      General: She is not in acute distress.    Appearance: Normal appearance.  HENT:     Head: Normocephalic and atraumatic.     Mouth/Throat:     Mouth: Mucous membranes are moist.     Pharynx: Oropharynx is clear. No oropharyngeal exudate or posterior oropharyngeal erythema.  Eyes:     General: No scleral icterus.    Extraocular Movements: Extraocular movements intact.     Conjunctiva/sclera: Conjunctivae normal.     Pupils: Pupils are equal, round, and reactive to light.  Cardiovascular:     Rate and Rhythm: Normal rate and regular rhythm.     Heart sounds: Normal heart sounds. No murmur heard.    No friction rub. No gallop.  Pulmonary:     Effort: Pulmonary effort is normal.     Breath sounds: Normal breath sounds. No wheezing, rhonchi or rales.  Abdominal:     General: There is no distension.     Palpations: Abdomen is soft. There is no hepatomegaly, splenomegaly or mass.     Tenderness: There is no abdominal tenderness.  Musculoskeletal:        General: Normal range of motion.     Cervical back: Normal range of motion and neck supple. No tenderness.     Right lower leg: No edema.     Left lower leg: No edema.  Lymphadenopathy:     Cervical: No cervical adenopathy.     Upper Body:     Right  upper body: No supraclavicular or axillary adenopathy.     Left upper body: No supraclavicular or axillary adenopathy.     Lower Body: No right inguinal adenopathy. No left inguinal adenopathy.  Skin:    General: Skin is warm and dry.     Coloration: Skin is not jaundiced.     Findings: No rash.  Neurological:     Mental Status: She is alert and oriented to person, place, and time.     Cranial Nerves: No cranial nerve deficit.  Psychiatric:        Mood and Affect: Mood normal.        Behavior: Behavior normal.        Thought Content: Thought content normal.      LABS:  Latest Ref Rng & Units 11/14/2022    9:56 AM 11/06/2022   11:10 AM 09/25/2022   10:00 AM  CBC  WBC 4.0 - 10.5 K/uL 6.6  8.2  6.5   Hemoglobin 12.0 - 15.0 g/dL 53.6  64.4  03.4   Hematocrit 36.0 - 46.0 % 34.5  34.6  38.3   Platelets 150 - 400 K/uL 338  263  323       Latest Ref Rng & Units 11/14/2022    9:56 AM 09/25/2022   10:00 AM 05/30/2022   10:17 AM  CMP  Glucose 70 - 99 mg/dL 98  742  84   BUN 6 - 20 mg/dL 8  15  10    Creatinine 0.44 - 1.00 mg/dL 5.95  6.38  7.56   Sodium 135 - 145 mmol/L 140  144  142   Potassium 3.5 - 5.1 mmol/L 3.0  4.4  5.2   Chloride 98 - 111 mmol/L 101  104  98   CO2 22 - 32 mmol/L 28  21  26    Calcium 8.9 - 10.3 mg/dL 9.2  9.7  43.3   Total Protein 6.5 - 8.1 g/dL 7.8  7.2  6.8   Total Bilirubin 0.3 - 1.2 mg/dL 0.6  <2.9  0.2   Alkaline Phos 38 - 126 U/L 67  92  83   AST 15 - 41 U/L 15  20  44   ALT 0 - 44 U/L 13  10  21       No results found for: "CEA1", "CEA" / No results found for: "CEA1", "CEA" No results found for: "PSA1" No results found for: "JJO841" No results found for: "CAN125"  No results found for: "TOTALPROTELP", "ALBUMINELP", "A1GS", "A2GS", "BETS", "BETA2SER", "GAMS", "MSPIKE", "SPEI"  Lab Results  Component Value Date   TIBC 596 (HH) 11/06/2022   TIBC 612 (HH) 09/25/2022   TIBC 459 (H) 05/30/2022   FERRITIN 47 11/14/2022   FERRITIN 81 11/06/2022    FERRITIN 39 09/25/2022   IRONPCTSAT 14 (L) 11/06/2022   IRONPCTSAT 8 (LL) 09/25/2022   IRONPCTSAT 20 05/30/2022   No results found for: "LDH"  STUDIES:  MM 3D SCREENING MAMMOGRAM BILATERAL BREAST  Result Date: 10/19/2022 CLINICAL DATA:  Screening. EXAM: DIGITAL SCREENING BILATERAL MAMMOGRAM WITH TOMOSYNTHESIS AND CAD TECHNIQUE: Bilateral screening digital craniocaudal and mediolateral oblique mammograms were obtained. Bilateral screening digital breast tomosynthesis was performed. The images were evaluated with computer-aided detection. COMPARISON:  Previous exam(s). ACR Breast Density Category c: The breasts are heterogeneously dense, which may obscure small masses. FINDINGS: There are no findings suspicious for malignancy. IMPRESSION: No mammographic evidence of malignancy. A result letter of this screening mammogram will be mailed directly to the patient. RECOMMENDATION: Screening mammogram in one year. (Code:SM-B-01Y) BI-RADS CATEGORY  1: Negative. Electronically Signed   By: Frederico Hamman M.D.   On: 10/19/2022 08:06      HISTORY:   Past Medical History:  Diagnosis Date   Anemia    Anxiety    "panic attacks, claustophobic on xanax"   Arthritis    Asthma    Complication of anesthesia    Difficult to put to sleep, Has run fever, "runs in famly"   COPD (chronic obstructive pulmonary disease) (HCC)    Enlarged heart    hx of   Essential (primary) hypertension    Family history of anesthesia complication    malignant hyperthermia on mother's side of family   Generalized anxiety disorder    GERD (gastroesophageal  reflux disease)    H/O hiatal hernia    hx of   Headache(784.0)    "due to Blood pressure"   History of melanoma    Hypertension    Dr. [place]ble [place], medical physician [phone]   Malignant hyperthermia    Mixed hyperlipidemia    Neuromuscular disorder (HCC)    "85 % nerve damage in left leg"   Pneumonia    hx of pneumonia x 2   PONV (postoperative nausea and  vomiting)    Spinal headache    Urinary tract infection    and kidney infection Hx of    Past Surgical History:  Procedure Laterality Date   ANTERIOR CERVICAL DECOMP/DISCECTOMY FUSION  12/28/2011   Procedure: ANTERIOR CERVICAL DECOMPRESSION/DISCECTOMY FUSION 2 LEVELS;  Surgeon: Mariam Dollar, MD;  Location: [facility];  Service: Neurosurgery;  Laterality: N/A;  HISTORY OF MALIGNANT HYPERTHERMIA Anterior Cervical Discectomy Fusion of Cervical five-six, Cervical six-seven   BLADDER SUSPENSION     BREAST SURGERY     left breast mass removed   hand mass     1994, multiple masses removed - "all benign"   leg mass     left leg  surgically removed   POSTERIOR FUSION LUMBAR SPINE     L4/5   TONSILLECTOMY     and adnoids   TUBAL LIGATION      Family History  Problem Relation Age of Onset   Colon cancer Mother    Stroke Maternal Grandmother    Stomach cancer Maternal Grandfather    Breast cancer Neg Hx     Social History:  reports that she has been smoking cigarettes. She started smoking about 44 years ago. She has a 69.5 pack-year smoking history. She has never used smokeless tobacco. She reports that she does not currently use alcohol. She reports that she does not use drugs.The patient is alone today.  Allergies:  Allergies  Allergen Reactions   Amoxicillin Anaphylaxis, Itching and Swelling   Penicillins Itching and Swelling    Throat swelling   Codeine Other (See Comments)    nausea   Oxycodone Other (See Comments)    Hallucinations   Oxycodone Hcl Other (See Comments)    hallucinations   Oxycodone Hcl     Current Medications: Current Outpatient Medications  Medication Sig Dispense Refill   ammonium lactate (AMLACTIN) 12 % cream Apply 1 Application topically as needed for dry skin.     etodolac (LODINE XL) 400 MG 24 hr tablet Take 400 mg by mouth 2 (two) times daily.     fluticasone furoate-vilanterol (BREO ELLIPTA) 200-25 MCG/ACT AEPB INHALE 1 PUFF INTO LUNGS ONCE  DAILY AS DIRECTED (RINSE, GARGLE & SPIT AFTER USE) 1 each 2   albuterol (VENTOLIN HFA) 108 (90 Base) MCG/ACT inhaler Inhale 1 puff into the lungs 3 (three) times daily as needed. For shortness of breath 1 each 3   ALPRAZolam (XANAX) 1 MG tablet TAKE 1 TABLET BY MOUTH 3 TIMES DAILY AS NEEDED FOR ANXIETY 90 tablet 1   amLODipine (NORVASC) 10 MG tablet TAKE 1 TABLET BY MOUTH EVERY DAY 90 tablet 0   azelastine (ASTELIN) 0.1 % nasal spray Place into both nostrils. (Patient not taking: Reported on 11/14/2022)     benzonatate (TESSALON) 100 MG capsule TAKE 1 CAPSULE BY MOUTH THREE TIMES A DAY AS NEEDED FOR COUGH (Patient not taking: Reported on 11/14/2022) 40 capsule 1   buPROPion (WELLBUTRIN SR) 150 MG 12 hr tablet Take 1 tablet (150 mg total)  by mouth 2 (two) times daily. (Patient not taking: Reported on 11/14/2022) 60 tablet 1   busPIRone (BUSPAR) 15 MG tablet TAKE 1 TABLET BY MOUTH 2 TIMES A DAY 180 tablet 0   clobetasol cream (TEMOVATE) 0.05 % SMARTSIG:2 Topical Twice Daily     DULoxetine (CYMBALTA) 60 MG capsule TAKE 1 CAPSULE BY MOUTH 2 TIMES DAILY 180 capsule 1   fenofibrate micronized (LOFIBRA) 134 MG capsule TAKE 1 CAPSULE BY MOUTH ONCE DAILY 90 capsule 1   furosemide (LASIX) 20 MG tablet TAKE 1 TABLET BY MOUTH EACH DAY 90 tablet 0   Iron, Ferrous Sulfate, 325 (65 Fe) MG TABS Take 325 mg by mouth daily. (Patient not taking: Reported on 11/14/2022) 30 tablet 5   lisinopril (ZESTRIL) 20 MG tablet TAKE ONE TABLET BY MOUTH EACH DAY FOR BLOOD PRESSURE 90 tablet 0   meloxicam (MOBIC) 7.5 MG tablet TAKE 1 TABLET BY MOUTH EVERY DAY 90 tablet 1   methocarbamol (ROBAXIN) 750 MG tablet Take 750 mg by mouth 3 (three) times daily.     mupirocin ointment (BACTROBAN) 2 % Apply 1 Application topically 3 (three) times daily.     omeprazole (PRILOSEC) 40 MG capsule TAKE 1 CAPSULE BY MOUTH DAILY BEFORE A MEAL 90 capsule 1   pregabalin (LYRICA) 200 MG capsule Take 200 mg by mouth 3 (three) times daily.     rosuvastatin  (CRESTOR) 5 MG tablet TAKE 1 TABLET BY MOUTH EVERY DAY FOR CHOLESTEROL 90 tablet 1   traMADol (ULTRAM) 50 MG tablet Take 1 tablet (50 mg total) by mouth 2 (two) times daily. 60 tablet 0   Vitamin D, Ergocalciferol, (DRISDOL) 1.25 MG (50000 UNIT) CAPS capsule Take 1 capsule (50,000 Units total) by mouth every 7 (seven) days. 5 capsule 5   No current facility-administered medications for this visit.     ASSESSMENT & PLAN:   Assessment:  SEVYNN MCCONNAUGHEY is a 57 y.o. female iron deficiency anemia responding to oral iron supplementation, which she is tolerating without significant difficulty.  I recommended she try MiraLAX or milk of magnesia for her constipation.  As her iron stores are adequate, we will hold off on IV iron at this time.  Agree with referral to GI as soon as possible to evaluate for blood loss, likely associated with meloxicam.  Her easy bruising is also likely associated with meloxicam.  Plan: 1.  Evaluate for other nutritional or trace mineral deficiency, hemolysis, or decreased RBC production contributing to her anemia 2.  The patient will contact GI to schedule an appointment soon as possible 3.  Follow-up in 3 months for repeat clinical assessment 4.  Follow-up with Kaitlyn Diaz regarding CT chest for lung cancer screening.  The patient understands the importance of complete abstinence from tobacco  I discussed the assessment and plan with the patient.  The patient was provided an opportunity to ask questions and all were answered.  The patient agreed with the plan and demonstrated an understanding of the instructions.  She knows to contact our office if she develops concerns prior to her next appointment.  Thank you for the referral.   I provided 45 minutes of face-to-face time during this encounter and > 50% was spent counseling as documented under my assessment and plan.    Adah Perl, PA-C   Physician Assistant [facility] [place] [phone]

## 2022-11-14 NOTE — Telephone Encounter (Signed)
Contacted pt to schedule an appt. Unable to reach via phone, voicemail was left.   Scheduling Message Entered by Belva Crome A on 11/14/2022 at  9:59 AM Priority: Routine <No visit type provided>  Department: [facility]  Provider:  Scheduling Notes:  Labs and f/u with [facility] in 3 months

## 2022-11-15 ENCOUNTER — Other Ambulatory Visit: Payer: Self-pay | Admitting: Physician Assistant

## 2022-11-15 ENCOUNTER — Telehealth: Payer: Self-pay

## 2022-11-15 DIAGNOSIS — E876 Hypokalemia: Secondary | ICD-10-CM

## 2022-11-15 MED ORDER — POTASSIUM CHLORIDE ER 10 MEQ PO TBCR
10.0000 meq | EXTENDED_RELEASE_TABLET | Freq: Every day | ORAL | 0 refills | Status: DC
Start: 2022-11-15 — End: 2022-12-14

## 2022-11-15 NOTE — Telephone Encounter (Signed)
The patient called today stating that she had blood work done at the cancer center. The patient stated she was told to follow-up with her PCP. Kennon Rounds please review the blood work and advise.

## 2022-11-15 NOTE — Telephone Encounter (Signed)
Patient notified and voiced understanding.

## 2022-11-15 NOTE — Telephone Encounter (Signed)
-----   Message from Adah Perl sent at 11/14/2022  5:49 PM EDT ----- Please let her know her folic acid is mildly low, start OTC folic acid 800 mcg daily. Also, her potassium is low, likely due to furosemide. Please have her see Marianne Sofia, NP to address the low potassium. Thanks

## 2022-11-15 NOTE — Telephone Encounter (Signed)
Called patient, left message for patient to call office back

## 2022-11-15 NOTE — Telephone Encounter (Signed)
Hematology will follow cbc/retic count/iron Folic acid slightly low - recommend to start otc supplement Potassium slightly low - will send in rx for supplement to take once daily and pt needs to recheck cmp in 2 weeks

## 2022-11-16 ENCOUNTER — Telehealth: Payer: Self-pay | Admitting: Hematology and Oncology

## 2022-11-16 NOTE — Telephone Encounter (Signed)
Patient has been scheduled. Aware of appt date and time   Scheduling Message Entered by Belva Crome A on 11/14/2022 at  9:59 AM Priority: Routine <No visit type provided>  Department: [facility]  Provider:  Scheduling Notes:  Labs and f/u with [facility] in 3 months

## 2022-11-16 NOTE — Telephone Encounter (Signed)
Patient Made Aware, Verbalized Understanding. 

## 2022-11-16 NOTE — Telephone Encounter (Signed)
We just sent in potassium supplement which should help with the lasix - would not recommend other diuretics at this time - recommend compression hose and leg elevation --- after getting potassium stable will probably be able to increase to the whole tablet

## 2022-11-16 NOTE — Telephone Encounter (Signed)
Patient called made her aware of provider recommendation. Patient stated that she has been taking 1/2 lasix but not every day, wanted to know if there is another fluid pill she can take instead of lasix because she is still having swelling but do her potassium levels she can't take a whole lasix. Please advise

## 2022-11-19 ENCOUNTER — Telehealth: Payer: Self-pay | Admitting: Gastroenterology

## 2022-11-19 NOTE — Telephone Encounter (Signed)
Transfer of care requests should be directed to the supervising physician, not the DOD. Thanks.

## 2022-11-19 NOTE — Telephone Encounter (Signed)
Good morning Dr. Russella Dar  The following patient is being referred to [place] for Other iron deficiency anemia. She does have previous GI history with [facility] and wishes not proceed with them because they wont allow her to. She missed some appointments because of her health and they dismissed her. Her records are available on Epic. Please review and advise of scheduling.

## 2022-11-21 NOTE — Telephone Encounter (Signed)
My apologies, Dr. Chales Abrahams you are supervising MD for this day. Will you be willing to accommodate the transfer request. Please advise.

## 2022-11-22 ENCOUNTER — Other Ambulatory Visit: Payer: Self-pay | Admitting: Physician Assistant

## 2022-11-22 DIAGNOSIS — M5137 Other intervertebral disc degeneration, lumbosacral region: Secondary | ICD-10-CM

## 2022-11-22 DIAGNOSIS — G8929 Other chronic pain: Secondary | ICD-10-CM

## 2022-11-22 NOTE — Telephone Encounter (Signed)
OK to schedule in APP clinic RG 

## 2022-11-28 ENCOUNTER — Other Ambulatory Visit: Payer: Medicare Other

## 2022-11-28 DIAGNOSIS — E876 Hypokalemia: Secondary | ICD-10-CM | POA: Diagnosis not present

## 2022-11-28 LAB — COMPREHENSIVE METABOLIC PANEL
ALT: 8 IU/L (ref 0–32)
AST: 13 IU/L (ref 0–40)
Albumin: 4.3 g/dL (ref 3.8–4.9)
Alkaline Phosphatase: 82 IU/L (ref 44–121)
BUN/Creatinine Ratio: 21 (ref 9–23)
BUN: 14 mg/dL (ref 6–24)
Bilirubin Total: 0.3 mg/dL (ref 0.0–1.2)
CO2: 25 mmol/L (ref 20–29)
Calcium: 9.8 mg/dL (ref 8.7–10.2)
Chloride: 104 mmol/L (ref 96–106)
Creatinine, Ser: 0.66 mg/dL (ref 0.57–1.00)
Globulin, Total: 2.5 g/dL (ref 1.5–4.5)
Glucose: 93 mg/dL (ref 70–99)
Potassium: 5 mmol/L (ref 3.5–5.2)
Sodium: 143 mmol/L (ref 134–144)
Total Protein: 6.8 g/dL (ref 6.0–8.5)
eGFR: 102 mL/min/{1.73_m2} (ref >59)

## 2022-11-29 ENCOUNTER — Other Ambulatory Visit: Payer: Self-pay | Admitting: Family Medicine

## 2022-11-30 DIAGNOSIS — M4316 Spondylolisthesis, lumbar region: Secondary | ICD-10-CM | POA: Diagnosis not present

## 2022-11-30 DIAGNOSIS — M5416 Radiculopathy, lumbar region: Secondary | ICD-10-CM | POA: Diagnosis not present

## 2022-11-30 DIAGNOSIS — M963 Postlaminectomy kyphosis: Secondary | ICD-10-CM | POA: Diagnosis not present

## 2022-12-04 ENCOUNTER — Other Ambulatory Visit: Payer: Self-pay | Admitting: Physician Assistant

## 2022-12-04 DIAGNOSIS — F411 Generalized anxiety disorder: Secondary | ICD-10-CM

## 2022-12-11 DIAGNOSIS — M5416 Radiculopathy, lumbar region: Secondary | ICD-10-CM | POA: Diagnosis not present

## 2022-12-11 DIAGNOSIS — M5432 Sciatica, left side: Secondary | ICD-10-CM | POA: Diagnosis not present

## 2022-12-11 DIAGNOSIS — M4316 Spondylolisthesis, lumbar region: Secondary | ICD-10-CM | POA: Diagnosis not present

## 2022-12-11 DIAGNOSIS — M963 Postlaminectomy kyphosis: Secondary | ICD-10-CM | POA: Diagnosis not present

## 2022-12-14 ENCOUNTER — Other Ambulatory Visit: Payer: Self-pay | Admitting: Physician Assistant

## 2022-12-14 DIAGNOSIS — E876 Hypokalemia: Secondary | ICD-10-CM

## 2022-12-25 ENCOUNTER — Other Ambulatory Visit: Payer: Self-pay | Admitting: Physician Assistant

## 2022-12-25 DIAGNOSIS — J438 Other emphysema: Secondary | ICD-10-CM

## 2022-12-27 ENCOUNTER — Encounter: Payer: Self-pay | Admitting: Physician Assistant

## 2022-12-27 ENCOUNTER — Ambulatory Visit (INDEPENDENT_AMBULATORY_CARE_PROVIDER_SITE_OTHER): Payer: Medicare HMO | Admitting: Physician Assistant

## 2022-12-27 VITALS — BP 102/68 | HR 96 | Temp 97.5°F | Ht 61.5 in | Wt 170.2 lb

## 2022-12-27 DIAGNOSIS — R079 Chest pain, unspecified: Secondary | ICD-10-CM

## 2022-12-27 DIAGNOSIS — D225 Melanocytic nevi of trunk: Secondary | ICD-10-CM | POA: Diagnosis not present

## 2022-12-27 DIAGNOSIS — F411 Generalized anxiety disorder: Secondary | ICD-10-CM | POA: Diagnosis not present

## 2022-12-27 DIAGNOSIS — R9431 Abnormal electrocardiogram [ECG] [EKG]: Secondary | ICD-10-CM

## 2022-12-27 DIAGNOSIS — G8929 Other chronic pain: Secondary | ICD-10-CM

## 2022-12-27 DIAGNOSIS — M5137 Other intervertebral disc degeneration, lumbosacral region: Secondary | ICD-10-CM

## 2022-12-27 DIAGNOSIS — D508 Other iron deficiency anemias: Secondary | ICD-10-CM

## 2022-12-27 DIAGNOSIS — M5442 Lumbago with sciatica, left side: Secondary | ICD-10-CM | POA: Diagnosis not present

## 2022-12-27 DIAGNOSIS — R0789 Other chest pain: Secondary | ICD-10-CM

## 2022-12-27 DIAGNOSIS — D485 Neoplasm of uncertain behavior of skin: Secondary | ICD-10-CM | POA: Diagnosis not present

## 2022-12-27 DIAGNOSIS — L820 Inflamed seborrheic keratosis: Secondary | ICD-10-CM | POA: Diagnosis not present

## 2022-12-27 HISTORY — DX: Chest pain, unspecified: R07.9

## 2022-12-27 HISTORY — DX: Abnormal electrocardiogram (ECG) (EKG): R94.31

## 2022-12-27 NOTE — Progress Notes (Signed)
Subjective:  Patient ID: Kaitlyn Diaz, female    DOB: [birth year]  Age: 57 y.o. MRN: [id number]  Chief Complaint  Patient presents with   Hypertension    HPI Pt was initially scheduled for preop exam for her back.  However pt states she is hesitant to actually have the surgery because she has not been informed of exactly what type of surgery the spine center would be doing -- she states she has not had a recent MRI either - was advised the surgery would take 6-9 hours.  Has history of DJD and chronic low back pain with sciatica.  She is uneasy about the treatment she is receiving [facility] --- I have recommended that she find out which neurosurgeons are in her network and will be glad to do a referral for a second opinion Regardless pt has iron def anemia and is currently following with hematology and needs to see GI (referral was made and pt has not made appt yet) and she will need to have clearance through those providers as well  However more importantly is the fact she states about 6 months ago she had an episode of chest pain with palpitations and caused her left arm pain and numbness.  She states she thought it was a panic attack and dismissed her symptoms.  She has several cardiac risk factors including tobacco abuse, hypertension and hyperlipidemia She denies any pain today     11/14/2022    9:35 AM 11/06/2022   10:41 AM 06/12/2021   11:40 AM 11/22/2020    1:39 PM  Depression screen PHQ 2/9  Decreased Interest 0 2 2 0  Down, Depressed, Hopeless 0 2 3 0  PHQ - 2 Score 0 4 5 0  Altered sleeping  3 3   Tired, decreased energy  3 3   Change in appetite  0 0   Feeling bad or failure about yourself   2 3   Trouble concentrating  0 0   Moving slowly or fidgety/restless  0 0   Suicidal thoughts  0 0   PHQ-9 Score  12 14         03/23/2021    9:32 AM 06/12/2021   11:39 AM 05/02/2022    1:28 PM 11/06/2022   10:40 AM  Fall Risk  Falls in the past year? 0 0 0 0   Was there an injury with Fall? 0 0 0 0  Fall Risk Category Calculator 0 0 0 0  Fall Risk Category (Retired) Low Low    (RETIRED) Patient Fall Risk Level Low fall risk Low fall risk    Patient at Risk for Falls Due to No Fall Risks  No Fall Risks No Fall Risks  Fall risk Follow up   Falls evaluation completed Falls evaluation completed     ROS CONSTITUTIONAL: Negative for chills, fatigue, fever, unintentional weight gain and unintentional weight loss.  E/N/T: Negative for ear pain, nasal congestion and sore throat.  CARDIOVASCULAR: see HPI RESPIRATORY: Negative for recent cough and dyspnea.  GASTROINTESTINAL: Negative for abdominal pain, acid reflux symptoms, constipation, diarrhea, nausea and vomiting.  MSK: see HPI INTEGUMENTARY: Negative for rash.  NEUROLOGICAL: Negative for dizziness and headaches.  PSYCHIATRIC: Negative for sleep disturbance and to question depression screen.  Negative for depression, negative for anhedonia.    Current Outpatient Medications:    albuterol (VENTOLIN HFA) 108 (90 Base) MCG/ACT inhaler, Inhale 1 puff into the lungs 3 (three) times daily  as needed. For shortness of breath, Disp: 1 each, Rfl: 3   ALPRAZolam (XANAX) 1 MG tablet, TAKE 1 TABLET BY MOUTH 3 TIMES DAILY AS NEEDED FOR ANXIETY, Disp: 90 tablet, Rfl: 0   amLODipine (NORVASC) 10 MG tablet, TAKE 1 TABLET BY MOUTH EVERY DAY, Disp: 90 tablet, Rfl: 0   ammonium lactate (AMLACTIN) 12 % cream, Apply 1 Application topically as needed for dry skin., Disp: , Rfl:    busPIRone (BUSPAR) 15 MG tablet, TAKE 1 TABLET BY MOUTH 2 TIMES A DAY, Disp: 180 tablet, Rfl: 0   clobetasol cream (TEMOVATE) 0.05 %, SMARTSIG:2 Topical Twice Daily, Disp: , Rfl:    DULoxetine (CYMBALTA) 60 MG capsule, TAKE 1 CAPSULE BY MOUTH 2 TIMES DAILY, Disp: 180 capsule, Rfl: 1   fenofibrate micronized (LOFIBRA) 134 MG capsule, TAKE 1 CAPSULE BY MOUTH ONCE DAILY, Disp: 90 capsule, Rfl: 1   fluticasone furoate-vilanterol (BREO ELLIPTA)  200-25 MCG/ACT AEPB, INHALE 1 PUFF INTO LUNGS ONCE DAILY AS DIRECTED (RINSE, GARGLE & SPIT AFTER USE), Disp: 60 each, Rfl: 2   furosemide (LASIX) 20 MG tablet, TAKE 1 TABLET BY MOUTH EACH DAY, Disp: 90 tablet, Rfl: 0   lisinopril (ZESTRIL) 20 MG tablet, TAKE ONE TABLET BY MOUTH EACH DAY FOR BLOOD PRESSURE, Disp: 90 tablet, Rfl: 0   meloxicam (MOBIC) 7.5 MG tablet, TAKE 1 TABLET BY MOUTH EVERY DAY, Disp: 90 tablet, Rfl: 1   methocarbamol (ROBAXIN) 750 MG tablet, Take 750 mg by mouth 3 (three) times daily., Disp: , Rfl:    mupirocin ointment (BACTROBAN) 2 %, Apply 1 Application topically 3 (three) times daily., Disp: , Rfl:    omeprazole (PRILOSEC) 40 MG capsule, TAKE 1 CAPSULE BY MOUTH DAILY BEFORE A MEAL, Disp: 90 capsule, Rfl: 1   potassium chloride (KLOR-CON) 10 MEQ tablet, TAKE 1 TABLET BY MOUTH EVERY DAY, Disp: 30 tablet, Rfl: 0   pregabalin (LYRICA) 200 MG capsule, Take 200 mg by mouth 3 (three) times daily., Disp: , Rfl:    rosuvastatin (CRESTOR) 5 MG tablet, TAKE 1 TABLET BY MOUTH EVERY DAY FOR CHOLESTEROL, Disp: 90 tablet, Rfl: 1   traMADol (ULTRAM) 50 MG tablet, Take 1 tablet (50 mg total) by mouth 2 (two) times daily., Disp: 60 tablet, Rfl: 1   Vitamin D, Ergocalciferol, (DRISDOL) 1.25 MG (50000 UNIT) CAPS capsule, Take 1 capsule (50,000 Units total) by mouth every 7 (seven) days., Disp: 5 capsule, Rfl: 5  Past Medical History:  Diagnosis Date   Anemia    Anxiety    "panic attacks, claustophobic on xanax"   Arthritis    Asthma    Complication of anesthesia    Difficult to put to sleep, Has run fever, "runs in famly"   COPD (chronic obstructive pulmonary disease) (HCC)    Enlarged heart    hx of   Essential (primary) hypertension    Family history of anesthesia complication    malignant hyperthermia on mother's side of family   Generalized anxiety disorder    GERD (gastroesophageal reflux disease)    H/O hiatal hernia    hx of   Headache(784.0)    "due to Blood pressure"    History of melanoma    Hypertension    Dr. [place]ble [place], medical physician [phone]   Malignant hyperthermia    Mixed hyperlipidemia    Neuromuscular disorder (HCC)    "85 % nerve damage in left leg"   Pneumonia    hx of pneumonia x 2   PONV (postoperative nausea and vomiting)  Spinal headache    Urinary tract infection    and kidney infection Hx of   Objective:  PHYSICAL EXAM:   BP 102/68 (BP Location: Right Arm, Patient Position: Sitting, Cuff Size: Large)   Pulse 96   Temp (!) 97.5 F (36.4 C) (Temporal)   Ht 5' 1.5" (1.562 m)   Wt 170 lb 3.2 oz (77.2 kg)   LMP 06/03/2011   SpO2 98%   BMI 31.64 kg/m    GEN: Well nourished, well developed, in no acute distress  Cardiac: RRR; no murmurs, rubs, or gallops,no edema - no significant varicosities Respiratory:  normal respiratory rate and pattern with no distress - normal breath sounds with no rales, rhonchi, wheezes or rubs GI: normal bowel sounds, no masses or tenderness  Skin: warm and dry, no rash   Psych: euthymic mood, appropriate affect and demeanor   EKG - no old comparison --- inverted Twaves noted V1, V2 - PVCs noted Assessment & Plan:    Other chest pain -     CBC with Differential/Platelet -     Comprehensive metabolic panel -     TSH -     EKG 12-Lead -     Ambulatory referral to Cardiology  Abnormal EKG -     Ambulatory referral to Cardiology  Other iron deficiency anemia -     Iron, TIBC and Ferritin Panel Recommend follow up with hematology as directed Pt to call GI to schedule appt DDD (degenerative disc disease), lumbosacral Continue meds Generalized anxiety disorder Continue meds Chronic bilateral low back pain with left-sided sciatica Continue meds and recommend second opinion because of her insecurities with current provider for her back pain    Follow-up: Return in about 4 weeks (around 01/24/2023) for follow-up.  An After Visit Summary was printed and given to the  patient.  Jettie Pagan [facility] [phone]

## 2022-12-28 LAB — CBC WITH DIFFERENTIAL/PLATELET
Basophils Absolute: 0.1 10*3/uL (ref 0.0–0.2)
Basos: 1 %
EOS (ABSOLUTE): 0.2 10*3/uL (ref 0.0–0.4)
Eos: 2 %
Hematocrit: 35.4 % (ref 34.0–46.6)
Hemoglobin: 11.1 g/dL (ref 11.1–15.9)
Immature Grans (Abs): 0.1 10*3/uL (ref 0.0–0.1)
Immature Granulocytes: 1 %
Lymphocytes Absolute: 1.3 10*3/uL (ref 0.7–3.1)
Lymphs: 19 %
MCH: 29.1 pg (ref 26.6–33.0)
MCHC: 31.4 g/dL — ABNORMAL LOW (ref 31.5–35.7)
MCV: 93 fL (ref 79–97)
Monocytes Absolute: 0.4 10*3/uL (ref 0.1–0.9)
Monocytes: 6 %
Neutrophils Absolute: 5 10*3/uL (ref 1.4–7.0)
Neutrophils: 71 %
Platelets: 407 10*3/uL (ref 150–450)
RBC: 3.81 x10E6/uL (ref 3.77–5.28)
RDW: 13.9 % (ref 11.7–15.4)
WBC: 7 10*3/uL (ref 3.4–10.8)

## 2022-12-28 LAB — COMPREHENSIVE METABOLIC PANEL
ALT: 6 IU/L (ref 0–32)
AST: 12 IU/L (ref 0–40)
Albumin: 4.3 g/dL (ref 3.8–4.9)
Alkaline Phosphatase: 73 IU/L (ref 44–121)
BUN/Creatinine Ratio: 16 (ref 9–23)
BUN: 12 mg/dL (ref 6–24)
Bilirubin Total: 0.3 mg/dL (ref 0.0–1.2)
CO2: 25 mmol/L (ref 20–29)
Calcium: 9.8 mg/dL (ref 8.7–10.2)
Chloride: 101 mmol/L (ref 96–106)
Creatinine, Ser: 0.74 mg/dL (ref 0.57–1.00)
Globulin, Total: 2.4 g/dL (ref 1.5–4.5)
Glucose: 99 mg/dL (ref 70–99)
Potassium: 4.9 mmol/L (ref 3.5–5.2)
Sodium: 142 mmol/L (ref 134–144)
Total Protein: 6.7 g/dL (ref 6.0–8.5)
eGFR: 94 mL/min/{1.73_m2} (ref >59)

## 2022-12-28 LAB — IRON,TIBC AND FERRITIN PANEL
Ferritin: 71 ng/mL (ref 15–150)
Iron Saturation: 15 % (ref 15–55)
Iron: 69 ug/dL (ref 27–159)
Total Iron Binding Capacity: 460 ug/dL — ABNORMAL HIGH (ref 250–450)
UIBC: 391 ug/dL (ref 131–425)

## 2022-12-28 LAB — TSH: TSH: 3.75 u[IU]/mL (ref 0.450–4.500)

## 2023-01-01 ENCOUNTER — Ambulatory Visit: Payer: Medicare HMO

## 2023-01-02 ENCOUNTER — Other Ambulatory Visit: Payer: Self-pay | Admitting: Physician Assistant

## 2023-01-02 DIAGNOSIS — F411 Generalized anxiety disorder: Secondary | ICD-10-CM

## 2023-01-03 ENCOUNTER — Other Ambulatory Visit: Payer: Self-pay | Admitting: Physician Assistant

## 2023-01-04 DIAGNOSIS — R160 Hepatomegaly, not elsewhere classified: Secondary | ICD-10-CM | POA: Diagnosis not present

## 2023-01-04 DIAGNOSIS — M5081 Other cervical disc disorders,  high cervical region: Secondary | ICD-10-CM | POA: Diagnosis not present

## 2023-01-04 DIAGNOSIS — M48061 Spinal stenosis, lumbar region without neurogenic claudication: Secondary | ICD-10-CM | POA: Diagnosis not present

## 2023-01-04 DIAGNOSIS — R079 Chest pain, unspecified: Secondary | ICD-10-CM | POA: Diagnosis not present

## 2023-01-04 DIAGNOSIS — S32010A Wedge compression fracture of first lumbar vertebra, initial encounter for closed fracture: Secondary | ICD-10-CM | POA: Insufficient documentation

## 2023-01-04 DIAGNOSIS — K5730 Diverticulosis of large intestine without perforation or abscess without bleeding: Secondary | ICD-10-CM | POA: Diagnosis not present

## 2023-01-04 DIAGNOSIS — I700 Atherosclerosis of aorta: Secondary | ICD-10-CM | POA: Diagnosis not present

## 2023-01-04 DIAGNOSIS — M50821 Other cervical disc disorders at C4-C5 level: Secondary | ICD-10-CM | POA: Diagnosis not present

## 2023-01-04 DIAGNOSIS — N854 Malposition of uterus: Secondary | ICD-10-CM | POA: Diagnosis not present

## 2023-01-04 DIAGNOSIS — M5002 Cervical disc disorder with myelopathy, mid-cervical region, unspecified level: Secondary | ICD-10-CM | POA: Diagnosis not present

## 2023-01-04 DIAGNOSIS — R296 Repeated falls: Secondary | ICD-10-CM | POA: Diagnosis not present

## 2023-01-04 DIAGNOSIS — M5083 Other cervical disc disorders, cervicothoracic region: Secondary | ICD-10-CM | POA: Diagnosis not present

## 2023-01-04 DIAGNOSIS — M5126 Other intervertebral disc displacement, lumbar region: Secondary | ICD-10-CM | POA: Diagnosis not present

## 2023-01-04 DIAGNOSIS — M4312 Spondylolisthesis, cervical region: Secondary | ICD-10-CM | POA: Diagnosis not present

## 2023-01-04 DIAGNOSIS — M5136 Other intervertebral disc degeneration, lumbar region: Secondary | ICD-10-CM | POA: Diagnosis not present

## 2023-01-04 DIAGNOSIS — M4856XA Collapsed vertebra, not elsewhere classified, lumbar region, initial encounter for fracture: Secondary | ICD-10-CM | POA: Diagnosis not present

## 2023-01-04 DIAGNOSIS — M549 Dorsalgia, unspecified: Secondary | ICD-10-CM | POA: Diagnosis not present

## 2023-01-04 DIAGNOSIS — S32018A Other fracture of first lumbar vertebra, initial encounter for closed fracture: Secondary | ICD-10-CM | POA: Diagnosis not present

## 2023-01-04 HISTORY — DX: Wedge compression fracture of first lumbar vertebra, initial encounter for closed fracture: S32.010A

## 2023-01-05 DIAGNOSIS — S32010A Wedge compression fracture of first lumbar vertebra, initial encounter for closed fracture: Secondary | ICD-10-CM | POA: Diagnosis not present

## 2023-01-05 DIAGNOSIS — M48061 Spinal stenosis, lumbar region without neurogenic claudication: Secondary | ICD-10-CM | POA: Diagnosis not present

## 2023-01-05 DIAGNOSIS — S32018A Other fracture of first lumbar vertebra, initial encounter for closed fracture: Secondary | ICD-10-CM | POA: Diagnosis not present

## 2023-01-05 DIAGNOSIS — M5002 Cervical disc disorder with myelopathy, mid-cervical region, unspecified level: Secondary | ICD-10-CM | POA: Diagnosis not present

## 2023-01-07 ENCOUNTER — Ambulatory Visit: Payer: Medicare HMO

## 2023-01-07 ENCOUNTER — Telehealth: Payer: Self-pay

## 2023-01-07 VITALS — BP 130/90 | HR 66 | Ht 60.0 in | Wt 171.4 lb

## 2023-01-07 DIAGNOSIS — R002 Palpitations: Secondary | ICD-10-CM

## 2023-01-07 DIAGNOSIS — R55 Syncope and collapse: Secondary | ICD-10-CM | POA: Insufficient documentation

## 2023-01-07 DIAGNOSIS — E782 Mixed hyperlipidemia: Secondary | ICD-10-CM

## 2023-01-07 DIAGNOSIS — R296 Repeated falls: Secondary | ICD-10-CM | POA: Insufficient documentation

## 2023-01-07 DIAGNOSIS — I10 Essential (primary) hypertension: Secondary | ICD-10-CM

## 2023-01-07 DIAGNOSIS — R079 Chest pain, unspecified: Secondary | ICD-10-CM

## 2023-01-07 HISTORY — DX: Syncope and collapse: R55

## 2023-01-07 HISTORY — DX: Repeated falls: R29.6

## 2023-01-07 HISTORY — DX: Palpitations: R00.2

## 2023-01-07 MED ORDER — METOPROLOL TARTRATE 50 MG PO TABS: ORAL_TABLET | ORAL | 0 refills | Status: DC

## 2023-01-07 NOTE — Assessment & Plan Note (Signed)
Cardiac versus neurogenic. Proceed with cardiac workup with echocardiogram, event monitor as above.

## 2023-01-07 NOTE — Assessment & Plan Note (Signed)
The 10-year ASCVD risk score (Arnett DK, et al., 2019) is: 7.5%   Values used to calculate the score:     Age: 57 years     Sex: Female     Is Non-Hispanic African American: No     Diabetic: No     Tobacco smoker: Yes     Systolic Blood Pressure: 130 mmHg     Is BP treated: Yes     HDL Cholesterol: 42 mg/dL     Total Cholesterol: 141 mg/dL   Continue rosuvastatin 5 mg once daily.

## 2023-01-07 NOTE — Assessment & Plan Note (Signed)
Mechanical versus cardiogenic. Event monitor as above.

## 2023-01-07 NOTE — Transitions of Care (Post Inpatient/ED Visit) (Unsigned)
01/07/2023  Name: Kaitlyn Diaz MRN: [id number] DOB: 06/07/1965  Today's TOC FU Call Status: Today's TOC FU Call Status:: Unsuccessful Call (1st Attempt) Unsuccessful Call (1st Attempt) Date: 01/07/23  Attempted to reach the patient regarding the most recent Inpatient/ED visit.  Follow Up Plan: Additional outreach attempts will be made to reach the patient to complete the Transitions of Care (Post Inpatient/ED visit) call.   Signature Karena Addison, LPN [facility] Nurse Health Advisor Direct Dial [phone]

## 2023-01-07 NOTE — Assessment & Plan Note (Signed)
Frequent episodes and at times associated with falls and syncope. Will proceed with 30-day event monitor. Advised to take precautions changing position and keep herself well-hydrated.

## 2023-01-07 NOTE — Patient Instructions (Signed)
Medication Instructions:  Your physician recommends that you continue on your current medications as directed. Please refer to the Current Medication list given to you today.   *If you need a refill on your cardiac medications before your next appointment, please call your pharmacy*   Lab Work: Your physician recommends that you have a BMP today in the office.   If you have labs (blood work) drawn today and your tests are completely normal, you will receive your results only by: MyChart Message (if you have MyChart) OR A paper copy in the mail If you have any lab test that is abnormal or we need to change your treatment, we will call you to review the results.   Testing/Procedures:   Your cardiac CT will be scheduled at one of the below locations:   [facility] [address] [place], [place] [address] [phone]  If scheduled at [facility], please arrive at the [facility] and Children's Entrance (Entrance C2) of [facility] 30 minutes prior to test start time. You can use the FREE valet parking offered at entrance C (encouraged to control the heart rate for the test)  Proceed to the [facility] Radiology Department (first floor) to check-in and test prep.  All radiology patients and guests should use entrance C2 at [facility], accessed from [facility], even though the hospital's physical address listed is [address].     Please follow these instructions carefully (unless otherwise directed):  On the Night Before the Test: Be sure to Drink plenty of water. Do not consume any caffeinated/decaffeinated beverages or chocolate 12 hours prior to your test. Do not take any antihistamines 12 hours prior to your test.   On the Day of the Test: Drink plenty of water until 1 hour prior to the test. Do not eat any food 1 hour prior to test. You may take your regular medications prior to the test.  Take metoprolol  (Lopressor) 50 mg two hours prior to test. This is a one time dose. HOLD Furosemide morning of the test. FEMALES- please wear underwire-free bra if available, avoid dresses & tight clothing      After the Test: Drink plenty of water. After receiving IV contrast, you may experience a mild flushed feeling. This is normal. On occasion, you may experience a mild rash up to 24 hours after the test. This is not dangerous. If this occurs, you can take Benadryl 25 mg and increase your fluid intake. If you experience trouble breathing, this can be serious. If it is severe call 911 IMMEDIATELY. If it is mild, please call our office. If you take any of these medications: Glipizide/Metformin, Avandament, Glucavance, please do not take 48 hours after completing test unless otherwise instructed.  We will call to schedule your test 2-4 weeks out understanding that some insurance companies will need an authorization prior to the service being performed.   For non-scheduling related questions, please contact the cardiac imaging nurse navigator should you have any questions/concerns: Rockwell Alexandria, Cardiac Imaging Nurse Navigator Larey Brick, Cardiac Imaging Nurse Navigator [facility] Direct Office Dial: [phone]   For scheduling needs, including cancellations and rescheduling, please call [place], [phone].   Your physician has requested that you have an echocardiogram. Echocardiography is a painless test that uses sound waves to create images of your heart. It provides your doctor with information about the size and shape of your heart and how well your  heart's chambers and valves are working. This procedure takes approximately one hour. There are no restrictions for this procedure. Please do NOT wear cologne, perfume, aftershave, or lotions (deodorant is allowed). Please arrive 15 minutes prior to your appointment time.   Your physician has recommended that you wear  an event monitor. Event monitors are medical devices that record the heart's electrical activity. Doctors most often [place] these monitors to diagnose arrhythmias. Arrhythmias are problems with the speed or rhythm of the heartbeat. The monitor is a small, portable device. You can wear one while you do your normal daily activities. This is usually used to diagnose what is causing palpitations/syncope (passing out). This is a 30 day monitor.  Your next appointment:   2 month(s)  The format for your next appointment:   In Person  Provider:   Huntley Dec, MD   Other Instructions Cardiac CT Angiogram A cardiac CT angiogram is a procedure to look at the heart and the area around the heart. It may be done to help find the cause of chest pains or other symptoms of heart disease. During this procedure, a substance called contrast dye is injected into the blood vessels in the area to be checked. A large X-ray machine, called a CT scanner, then takes detailed pictures of the heart and the surrounding area. The procedure is also sometimes called a coronary CT angiogram, coronary artery scanning, or CTA. A cardiac CT angiogram allows the health care provider to see how well blood is flowing to and from the heart. The health care provider will be able to see if there are any problems, such as: Blockage or narrowing of the coronary arteries in the heart. Fluid around the heart. Signs of weakness or disease in the muscles, valves, and tissues of the heart. Tell a health care provider about: Any allergies you have. This is especially important if you have had a previous allergic reaction to contrast dye. All medicines you are taking, including vitamins, herbs, eye drops, creams, and over-the-counter medicines. Any blood disorders you have. Any surgeries you have had. Any medical conditions you have. Whether you are pregnant or may be pregnant. Any anxiety disorders, chronic pain, or other conditions you  have that may increase your stress or prevent you from lying still. What are the risks? Generally, this is a safe procedure. However, problems may occur, including: Bleeding. Infection. Allergic reactions to medicines or dyes. Damage to other structures or organs. Kidney damage from the contrast dye that is used. Increased risk of cancer from radiation exposure. This risk is low. Talk with your health care provider about: The risks and benefits of testing. How you can receive the lowest dose of radiation. What happens before the procedure? Wear comfortable clothing and remove any jewelry, glasses, dentures, and hearing aids. Follow instructions from your health care provider about eating and drinking. This may include: For 12 hours before the procedure -- avoid caffeine. This includes tea, coffee, soda, energy drinks, and diet pills. Drink plenty of water or other fluids that do not have caffeine in them. Being well hydrated can prevent complications. For 4-6 hours before the procedure -- stop eating and drinking. The contrast dye can cause nausea, but this is less likely if your stomach is empty. Ask your health care provider about changing or stopping your regular medicines. This is especially important if you are taking diabetes medicines, blood thinners, or medicines to treat problems with erections (erectile dysfunction). What happens during the procedure?  Hair  on your chest may need to be removed so that small sticky patches called electrodes can be placed on your chest. These will transmit information that helps to monitor your heart during the procedure. An IV will be inserted into one of your veins. You might be given a medicine to control your heart rate during the procedure. This will help to ensure that good images are obtained. You will be asked to lie on an exam table. This table will slide in and out of the CT machine during the procedure. Contrast dye will be injected into the  IV. You might feel warm, or you may get a metallic taste in your mouth. You will be given a medicine called nitroglycerin. This will relax or dilate the arteries in your heart. The table that you are lying on will move into the CT machine tunnel for the scan. The person running the machine will give you instructions while the scans are being done. You may be asked to: Keep your arms above your head. Hold your breath. Stay very still, even if the table is moving. When the scanning is complete, you will be moved out of the machine. The IV will be removed. The procedure may vary among health care providers and hospitals. What can I expect after the procedure? After your procedure, it is common to have: A metallic taste in your mouth from the contrast dye. A feeling of warmth. A headache from the nitroglycerin. Follow these instructions at home: Take over-the-counter and prescription medicines only as told by your health care provider. If you are told, drink enough fluid to keep your urine pale yellow. This will help to flush the contrast dye out of your body. Most people can return to their normal activities right after the procedure. Ask your health care provider what activities are safe for you. It is up to you to get the results of your procedure. Ask your health care provider, or the department that is doing the procedure, when your results will be ready. Keep all follow-up visits as told by your health care provider. This is important. Contact a health care provider if: You have any symptoms of allergy to the contrast dye. These include: Shortness of breath. Rash or hives. A racing heartbeat. Summary A cardiac CT angiogram is a procedure to look at the heart and the area around the heart. It may be done to help find the cause of chest pains or other symptoms of heart disease. During this procedure, a large X-ray machine, called a CT scanner, takes detailed pictures of the heart and the  surrounding area after a contrast dye has been injected into blood vessels in the area. Ask your health care provider about changing or stopping your regular medicines before the procedure. This is especially important if you are taking diabetes medicines, blood thinners, or medicines to treat erectile dysfunction. If you are told, drink enough fluid to keep your urine pale yellow. This will help to flush the contrast dye out of your body. This information is not intended to replace advice given to you by your health care provider. Make sure you discuss any questions you have with your health care provider. Document Revised: 11/19/2018 Document Reviewed: 11/19/2018 Elsevier Patient Education  2020 [employer].

## 2023-01-07 NOTE — Assessment & Plan Note (Signed)
At home on  lisinopril and amlodipine. Appears erroneously recorded below at initial documentation. Blood pressure today 130 over 90 mmHg.  Target below 130 over 80 mmHg. Advised to keep a close log at home. If not at target, can further titrate up lisinopril dose.

## 2023-01-07 NOTE — Assessment & Plan Note (Signed)
She does have significant cardiovascular risk factors. Incidentally, high stress, age, smoking history until recently, elevated ASCVD risk score. Baseline EKG findings abnormalities which could be related to underlying pulmonary versus cardiac etiology.  Will assess for coronary artery disease with CT coronary angiogram. She denies any known allergy to iodine. Will prescribe 1 dose of metoprolol tartrate 50 mg on the morning of the test to optimize heart rates for the imaging.  Will also obtain a transthoracic echocardiogram for cardiac structure and function.

## 2023-01-07 NOTE — Progress Notes (Signed)
Cardiology Consultation:    Date:  01/07/2023   ID:  Kaitlyn Diaz, DOB 29-Mar-1966, MRN [id number]  PCP:  Kaitlyn Sofia, PA-C  Cardiologist:  Kaitlyn Corporal Aundrea Horace, MD   Referring MD: Kaitlyn Sofia, PA-C   Chief Complaint  Patient presents with   Abnormal ECG    Back surgery clearance was denied  Was hospitalized at [facility]     ASSESSMENT AND PLAN:   Kaitlyn Diaz 57 year old woman with no prior significant cardiac history, has reportedly significantly limited functional status due to chronic back pain and anticipating neurosurgery evaluation and surgery near future. Has hypertension, hyperlipidemia, smoking [quit a week ago], unclear if she has had history of stroke but appears to have had complicated back surgery back in 2013.  Now with symptoms of recurrent falls, syncope, chest discomfort. Baseline EKG abnormalities with T wave inversions in lead V1 V2.  Problem List Items Addressed This Visit     Benign essential hypertension - Primary    At home on  lisinopril and amlodipine. Appears erroneously recorded below at initial documentation. Blood pressure today 130 over 90 mmHg.  Target below 130 over 80 mmHg. Advised to keep a close log at home. If not at target, can further titrate up lisinopril dose.        Relevant Medications   metoprolol tartrate (LOPRESSOR) 50 MG tablet   Other Relevant Orders   EKG 12-Lead (Completed)   Mixed hyperlipidemia    The 10-year ASCVD risk score (Arnett DK, et al., 2019) is: 7.5%   Values used to calculate the score:     Age: 72 years     Sex: Female     Is Non-Hispanic African American: No     Diabetic: No     Tobacco smoker: Yes     Systolic Blood Pressure: 130 mmHg     Is BP treated: Yes     HDL Cholesterol: 42 mg/dL     Total Cholesterol: 141 mg/dL   Continue rosuvastatin 5 mg once daily.       Relevant Medications   metoprolol tartrate (LOPRESSOR) 50 MG tablet   Essential (primary) hypertension   Relevant  Medications   metoprolol tartrate (LOPRESSOR) 50 MG tablet   Chest pain of uncertain etiology    She does have significant cardiovascular risk factors. Incidentally, high stress, age, smoking history until recently, elevated ASCVD risk score. Baseline EKG findings abnormalities which could be related to underlying pulmonary versus cardiac etiology.  Will assess for coronary artery disease with CT coronary angiogram. She denies any known allergy to iodine. Will prescribe 1 dose of metoprolol tartrate 50 mg on the morning of the test to optimize heart rates for the imaging.  Will also obtain a transthoracic echocardiogram for cardiac structure and function.       Relevant Orders   Basic metabolic panel   CT CORONARY MORPH W/CTA COR W/SCORE W/CA W/CM &/OR WO/CM   Recurrent falls    Mechanical versus cardiogenic. Event monitor as above.       Syncope and collapse    Cardiac versus neurogenic. Proceed with cardiac workup with echocardiogram, event monitor as above.      Relevant Orders   Cardiac event monitor   ECHOCARDIOGRAM COMPLETE   CT CORONARY MORPH W/CTA COR W/SCORE W/CA W/CM &/OR WO/CM   Palpitations    Frequent episodes and at times associated with falls and syncope. Will proceed with 30-day event monitor. Advised to take precautions changing position and keep herself well-hydrated.  Relevant Orders   Cardiac event monitor   Other Visit Diagnoses     Precordial pain       Relevant Orders   CT CORONARY MORPH W/CTA COR W/SCORE W/CA W/CM &/OR WO/CM      Return to clinic for evaluation in 2 months.   History of Present Illness:    Kaitlyn Diaz is a 57 y.o. female who is being seen today for the evaluation of chest pain episodes at the request of Kaitlyn Diaz, Cordelia Poche.   With history of chronic back pain with recent evidence of compression fracture on MRI of the spine, hypertension, hyperlipidemia, smoking, remote h/o stroke? After a complicated back  surgery in 2013.  Denies any significant prior cardiac history.  Here for the visit today accompanied by her sister. She lives at home with her husband.  She mentions limited functional status at home due to chronic back pain.  Has had multiple falls in the past couple months.  Mentions with her recent last episode of fall at home in the restroom she might have passed out for few minutes.  She feels most of her falls are related to her lower extremity weakness.  A month ago she had an episode of palpitations while sitting and watching TV at home with her husband.  Symptoms lasted for few minutes associated with lightheadedness and discomfort going down the left upper extremity.  Also associated with a sensation of shortness of breath.  Does notice pedal edema at times and uses Lasix on an as-needed basis, last use over a week ago.  She does have history of COPD and uses albuterol inhaler on an as-needed basis up to 2 times a week.  Mentions blood pressures at home typically 100s to 120s for the top number and 60s to 80s on the bottom number. Blood pressure reading today in the office initially noted as 88/50 mmHg. I rechecked the blood pressure myself and it was 130 over 90 mmHg.  I feel the above reading was an error. She currently denies any symptoms of lightheadedness, dizziness or chest pain. No shortness of breath.   EKG in the clinic today shows sinus rhythm heart rate 66/min, low voltage QRS, PR interval 134 ms, inverted T waves in lead V1 V2.   Past Medical History:  Diagnosis Date   Anemia    Anxiety    "panic attacks, claustophobic on xanax"   Arthritis    Asthma    Complication of anesthesia    Difficult to put to sleep, Has run fever, "runs in famly"   COPD (chronic obstructive pulmonary disease) (HCC)    Enlarged heart    hx of   Essential (primary) hypertension    Family history of anesthesia complication    malignant hyperthermia on mother's side of family    Generalized anxiety disorder    GERD (gastroesophageal reflux disease)    H/O hiatal hernia    hx of   Headache(784.0)    "due to Blood pressure"   History of melanoma    Hypertension    Dr. [place]ble [place], medical physician [phone]   Malignant hyperthermia    Mixed hyperlipidemia    Neuromuscular disorder (HCC)    "85 % nerve damage in left leg"   Pneumonia    hx of pneumonia x 2   PONV (postoperative nausea and vomiting)    Spinal headache    Urinary tract infection    and kidney infection Hx of    Past Surgical History:  Procedure Laterality Date   ANTERIOR CERVICAL DECOMP/DISCECTOMY FUSION  12/28/2011   Procedure: ANTERIOR CERVICAL DECOMPRESSION/DISCECTOMY FUSION 2 LEVELS;  Surgeon: Mariam Dollar, MD;  Location: [facility];  Service: Neurosurgery;  Laterality: N/A;  HISTORY OF MALIGNANT HYPERTHERMIA Anterior Cervical Discectomy Fusion of Cervical five-six, Cervical six-seven   BLADDER SUSPENSION     BREAST SURGERY     left breast mass removed   hand mass     1994, multiple masses removed - "all benign"   leg mass     left leg  surgically removed   POSTERIOR FUSION LUMBAR SPINE     L4/5   TONSILLECTOMY     and adnoids   TUBAL LIGATION      Current Medications: Current Meds  Medication Sig   albuterol (VENTOLIN HFA) 108 (90 Base) MCG/ACT inhaler Inhale 1 puff into the lungs 3 (three) times daily as needed. For shortness of breath (Patient taking differently: Inhale 1 puff into the lungs 3 (three) times daily as needed for wheezing or shortness of breath. For shortness of breath)   ALPRAZolam (XANAX) 1 MG tablet TAKE 1 TABLET BY MOUTH 3 TIMES DAILY AS NEEDED FOR ANXIETY   amLODipine (NORVASC) 10 MG tablet TAKE 1 TABLET BY MOUTH EVERY DAY (Patient taking differently: Take 10 mg by mouth daily.)   ammonium lactate (AMLACTIN) 12 % cream Apply 1 Application topically as needed for dry skin.   busPIRone (BUSPAR) 15 MG tablet TAKE 1 TABLET BY MOUTH 2 TIMES A DAY (Patient  taking differently: Take 15 mg by mouth 2 (two) times daily.)   clobetasol cream (TEMOVATE) 0.05 % Apply 1 Application topically 2 (two) times daily.   doxycycline (VIBRA-TABS) 100 MG tablet Take 100 mg by mouth 2 (two) times daily.   DULoxetine (CYMBALTA) 60 MG capsule TAKE 1 CAPSULE BY MOUTH 2 TIMES DAILY (Patient taking differently: Take 60 mg by mouth 2 (two) times daily.)   etodolac (LODINE) 400 MG tablet Take 400 mg by mouth 2 (two) times daily.   fenofibrate micronized (LOFIBRA) 134 MG capsule TAKE 1 CAPSULE BY MOUTH ONCE DAILY (Patient taking differently: Take 134 mg by mouth daily before breakfast. TAKE 1 CAPSULE BY MOUTH ONCE DAILY)   ferrous sulfate (FEROSUL) 325 (65 FE) MG tablet Take 325 mg by mouth daily with breakfast.   fluticasone furoate-vilanterol (BREO ELLIPTA) 200-25 MCG/ACT AEPB INHALE 1 PUFF INTO LUNGS ONCE DAILY AS DIRECTED (RINSE, GARGLE & SPIT AFTER USE) (Patient taking differently: Inhale 1 puff into the lungs daily.)   furosemide (LASIX) 20 MG tablet TAKE 1 TABLET BY MOUTH EACH DAY (Patient taking differently: Take 20 mg by mouth daily.)   HYDROcodone-acetaminophen (NORCO/VICODIN) 5-325 MG tablet Take 1 tablet by mouth every 6 (six) hours as needed for moderate pain or severe pain.   lisinopril (ZESTRIL) 20 MG tablet TAKE ONE TABLET BY MOUTH EACH DAY FOR BLOOD PRESSURE (Patient taking differently: Take 20 mg by mouth daily.)   meloxicam (MOBIC) 7.5 MG tablet TAKE 1 TABLET BY MOUTH EVERY DAY   methocarbamol (ROBAXIN) 750 MG tablet Take 750 mg by mouth 3 (three) times daily.   metoprolol tartrate (LOPRESSOR) 50 MG tablet Take 50 mg Lopressor 2 hours prior to your CT scan for a heart rate greater than 55.   mupirocin ointment (BACTROBAN) 2 % Apply 1 Application topically 3 (three) times daily.   omeprazole (PRILOSEC) 40 MG capsule TAKE 1 CAPSULE BY MOUTH DAILY BEFORE A MEAL (Patient taking differently: Take 40 mg by mouth daily.  TAKE 1 CAPSULE BY MOUTH DAILY BEFORE A MEAL)    potassium chloride (KLOR-CON) 10 MEQ tablet TAKE 1 TABLET BY MOUTH EVERY DAY   pregabalin (LYRICA) 200 MG capsule Take 200 mg by mouth 3 (three) times daily.   rosuvastatin (CRESTOR) 5 MG tablet TAKE 1 TABLET BY MOUTH EVERY DAY FOR CHOLESTEROL (Patient taking differently: Take 5 mg by mouth daily. TAKE 1 TABLET BY MOUTH EVERY DAY FOR CHOLESTEROL)   traMADol (ULTRAM) 50 MG tablet Take 1 tablet (50 mg total) by mouth 2 (two) times daily.   Vitamin D, Ergocalciferol, (DRISDOL) 1.25 MG (50000 UNIT) CAPS capsule Take 1 capsule (50,000 Units total) by mouth every 7 (seven) days.     Allergies:   Amoxicillin, Penicillins, Codeine, Oxycodone, Oxycodone hcl, and Oxycodone hcl   Social History   Socioeconomic History   Marital status: Married    Spouse name: Not on file   Number of children: 2   Years of education: Not on file   Highest education level: GED or equivalent  Occupational History   Occupation: Disabled  Tobacco Use   Smoking status: Every Day    Average packs/day: 0.9 packs/day for 78.0 years (69.5 ttl pk-yrs)    Types: Cigarettes    Start date: 31   Smokeless tobacco: Never  Vaping Use   Vaping status: Some Days   Substances: Nicotine, Flavoring  Substance and Sexual Activity   Alcohol use: Not Currently    Comment: Drinks occasionally, and consumes wine.   Drug use: No   Sexual activity: Not Currently  Other Topics Concern   Not on file  Social History Narrative   Not on file   Social Determinants of Health   Financial Resource Strain: Low Risk  (11/06/2022)   Overall Financial Resource Strain (CARDIA)    Difficulty of Paying Living Expenses: Not hard at all  Food Insecurity: No Food Insecurity (11/14/2022)   Hunger Vital Sign    Worried About Running Out of Food in the Last Year: Never true    Ran Out of Food in the Last Year: Never true  Transportation Needs: No Transportation Needs (11/14/2022)   PRAPARE - Administrator, Civil Service (Medical): No     Lack of Transportation (Non-Medical): No  Physical Activity: Inactive (11/06/2022)   Exercise Vital Sign    Days of Exercise per Week: 0 days    Minutes of Exercise per Session: 0 min  Stress: No Stress Concern Present (11/06/2022)   [employer] of Occupational Health - Occupational Stress Questionnaire    Feeling of Stress : Not at all  Social Connections: Unknown (01/04/2023)   [facility] from [facility]   Social Network    Social Network: Not on file  Recent Concern: Social Connections - Moderately Isolated (11/06/2022)   Social Connection and Isolation Panel [NHANES]    Frequency of Communication with Friends and Family: More than three times a week    Frequency of Social Gatherings with Friends and Family: More than three times a week    Attends Religious Services: Never    Database administrator or Organizations: No    Attends Engineer, structural: Never    Marital Status: Married     Family History: The patient's family history includes Colon cancer in her mother; Stomach cancer in her maternal grandfather; Stroke in her maternal grandmother. There is no history of Breast cancer. ROS:   Please see the history of present illness.    All 14  point review of systems negative except as described per history of present illness.  EKGs/Labs/Other Studies Reviewed:    The following studies were reviewed today:   EKG:  EKG Interpretation Date/Time:  Monday January 07 2023 12:54:35 EDT Ventricular Rate:  66 PR Interval:  134 QRS Duration:  90 QT Interval:  418 QTC Calculation: 438 R Axis:   79  Text Interpretation: Normal sinus rhythm Low voltage QRS RSR' or QR pattern in V1 suggests right ventricular conduction delay Borderline ECG No previous ECGs available Confirmed by Huntley Dec reddy [phone]) on 01/07/2023 1:16:33 PM    Recent Labs: 12/27/2022: ALT 6; BUN 12; Creatinine, Ser 0.74; Hemoglobin 11.1; Platelets 407; Potassium 4.9; Sodium 142; TSH 3.750   Recent Lipid Panel    Component Value Date/Time   CHOL 141 09/25/2022 1000   TRIG 126 09/25/2022 1000   HDL 42 09/25/2022 1000   CHOLHDL 3.4 09/25/2022 1000   LDLCALC 76 09/25/2022 1000    Physical Exam:    VS:  BP (!) 130/90 Comment: per Dr. Vincent Gros  Pulse 66   Ht 5' (1.524 m)   Wt 171 lb 6.4 oz (77.7 kg)   LMP 06/03/2011   SpO2 95%   BMI 33.47 kg/m     Wt Readings from Last 3 Encounters:  01/07/23 171 lb 6.4 oz (77.7 kg)  12/27/22 170 lb 3.2 oz (77.2 kg)  11/14/22 172 lb 8 oz (78.2 kg)     GENERAL:  Well nourished, well developed in no acute distress NECK: No JVD; No carotid bruits CARDIAC: RRR, S1 and S2 present, no murmurs, no rubs, no gallops CHEST:  Clear to auscultation without rales, wheezing or rhonchi  Extremities: No pitting pedal edema. Pulses bilaterally symmetric with radial 2+ and dorsalis pedis 2+ NEUROLOGIC:  Alert and oriented x 3  Medication Adjustments/Labs and Tests Ordered: Current medicines are reviewed at length with the patient today.  Concerns regarding medicines are outlined above.  Orders Placed This Encounter  Procedures   CT CORONARY MORPH W/CTA COR W/SCORE W/CA W/CM &/OR WO/CM   Basic metabolic panel   Cardiac event monitor   EKG 12-Lead   ECHOCARDIOGRAM COMPLETE   Meds ordered this encounter  Medications   metoprolol tartrate (LOPRESSOR) 50 MG tablet    Sig: Take 50 mg Lopressor 2 hours prior to your CT scan for a heart rate greater than 55.    Dispense:  1 tablet    Refill:  0    Signed, Bethania Schlotzhauer reddy Aubryanna Nesheim, MD, MPH, [facility]. 01/07/2023 1:54 PM    [facility]

## 2023-01-08 ENCOUNTER — Telehealth: Payer: Self-pay | Admitting: Physician Assistant

## 2023-01-08 LAB — BASIC METABOLIC PANEL
BUN/Creatinine Ratio: 18 (ref 9–23)
BUN: 17 mg/dL (ref 6–24)
CO2: 22 mmol/L (ref 20–29)
Calcium: 9.9 mg/dL (ref 8.7–10.2)
Chloride: 100 mmol/L (ref 96–106)
Creatinine, Ser: 0.94 mg/dL (ref 0.57–1.00)
Glucose: 79 mg/dL (ref 70–99)
Potassium: 4.9 mmol/L (ref 3.5–5.2)
Sodium: 139 mmol/L (ref 134–144)
eGFR: 70 mL/min/{1.73_m2} (ref >59)

## 2023-01-08 NOTE — Telephone Encounter (Signed)
Patient wanted to let you know she has to wear heart monitor for 30 days, also is/will be scheduled for some heart test and she also wanted to let you know they detected her liver is enlarged while she was in the hospital and they are concerned some of her medications may have caused this situation.    Thank you   Gabriel Cirri [place] AW[facility] TEAM Direct Dial [phone]

## 2023-01-08 NOTE — Transitions of Care (Post Inpatient/ED Visit) (Unsigned)
01/08/2023  Name: Kaitlyn Diaz MRN: [id number] DOB: [birth year]  Today's TOC FU Call Status: Today's TOC FU Call Status:: Unsuccessful Call (2nd Attempt) Unsuccessful Call (1st Attempt) Date: 01/07/23 Unsuccessful Call (2nd Attempt) Date: 01/08/23  Attempted to reach the patient regarding the most recent Inpatient/ED visit.  Follow Up Plan: Additional outreach attempts will be made to reach the patient to complete the Transitions of Care (Post Inpatient/ED visit) call.   Signature Karena Addison, LPN [facility] Nurse Health Advisor Direct Dial [phone]

## 2023-01-09 NOTE — Transitions of Care (Post Inpatient/ED Visit) (Signed)
01/09/2023  Name: Kaitlyn Diaz MRN: [id number] DOB: [birth year]  Today's TOC FU Call Status: Today's TOC FU Call Status:: Unsuccessful Call (3rd Attempt) Unsuccessful Call (1st Attempt) Date: 01/07/23 Unsuccessful Call (2nd Attempt) Date: 01/08/23 Unsuccessful Call (3rd Attempt) Date: 01/09/23  Attempted to reach the patient regarding the most recent Inpatient/ED visit.  Follow Up Plan: No further outreach attempts will be made at this time. We have been unable to contact the patient.  Signature Karena Addison, LPN [facility] Nurse Health Advisor Direct Dial [phone]

## 2023-01-11 NOTE — Telephone Encounter (Signed)
Left VM for patient to call and schedule OV. 

## 2023-01-14 ENCOUNTER — Telehealth (HOSPITAL_COMMUNITY): Payer: Self-pay | Admitting: *Deleted

## 2023-01-14 NOTE — Telephone Encounter (Signed)
Attempted to call patient regarding upcoming cardiac CT appointment. Left message on voicemail with name and callback number Hayley Sharpe RN Navigator Cardiac Imaging [facility] [phone] Office   

## 2023-01-15 ENCOUNTER — Ambulatory Visit (HOSPITAL_COMMUNITY)
Admission: RE | Admit: 2023-01-15 | Discharge: 2023-01-15 | Disposition: A | Discharge status: Home / Self Care | Payer: Medicare HMO | Source: Ambulatory Visit

## 2023-01-15 DIAGNOSIS — R072 Precordial pain: Secondary | ICD-10-CM | POA: Diagnosis not present

## 2023-01-15 DIAGNOSIS — L820 Inflamed seborrheic keratosis: Secondary | ICD-10-CM | POA: Diagnosis not present

## 2023-01-15 DIAGNOSIS — R079 Chest pain, unspecified: Secondary | ICD-10-CM | POA: Insufficient documentation

## 2023-01-15 DIAGNOSIS — I2510 Atherosclerotic heart disease of native coronary artery without angina pectoris: Secondary | ICD-10-CM | POA: Diagnosis not present

## 2023-01-15 DIAGNOSIS — L720 Epidermal cyst: Secondary | ICD-10-CM | POA: Diagnosis not present

## 2023-01-15 MED ORDER — IOHEXOL 350 MG/ML SOLN
100.0000 mL | Freq: Once | INTRAVENOUS | Status: AC | PRN
  Administered 2023-01-15: 100 mL via INTRAVENOUS

## 2023-01-15 MED ORDER — NITROGLYCERIN 0.4 MG SL SUBL
SUBLINGUAL_TABLET | SUBLINGUAL | Status: AC
  Filled 2023-01-15: qty 2

## 2023-01-15 MED ORDER — NITROGLYCERIN 0.4 MG SL SUBL
0.8000 mg | SUBLINGUAL_TABLET | Freq: Once | SUBLINGUAL | Status: AC
  Administered 2023-01-15: 0.8 mg via SUBLINGUAL

## 2023-01-22 ENCOUNTER — Telehealth: Payer: Self-pay

## 2023-01-22 MED ORDER — ROSUVASTATIN CALCIUM 20 MG PO TABS: 20.0000 mg | ORAL_TABLET | Freq: Every day | ORAL | 3 refills | Status: DC

## 2023-01-22 NOTE — Addendum Note (Signed)
Addended by: Baldo Ash D on: 01/22/2023 02:06 PM   Modules accepted: Orders

## 2023-01-22 NOTE — Telephone Encounter (Signed)
Spoke with pt. Advised per Dr. Madireddy's note to increase Rosuvastatin to 20mg  daily and take Aspirin 81mg  daily. Pt agreed and verbalized understanding. Sent to front desk to make appt to discuss CT scan. Routed to PCP.

## 2023-01-24 ENCOUNTER — Ambulatory Visit: Payer: Medicare HMO

## 2023-01-25 ENCOUNTER — Ambulatory Visit: Payer: Medicare HMO

## 2023-01-25 VITALS — BP 98/56 | HR 74 | Ht 61.0 in | Wt 176.0 lb

## 2023-01-25 DIAGNOSIS — F17200 Nicotine dependence, unspecified, uncomplicated: Secondary | ICD-10-CM

## 2023-01-25 DIAGNOSIS — I25119 Atherosclerotic heart disease of native coronary artery with unspecified angina pectoris: Secondary | ICD-10-CM | POA: Diagnosis not present

## 2023-01-25 DIAGNOSIS — I2510 Atherosclerotic heart disease of native coronary artery without angina pectoris: Secondary | ICD-10-CM

## 2023-01-25 DIAGNOSIS — R079 Chest pain, unspecified: Secondary | ICD-10-CM

## 2023-01-25 HISTORY — DX: Atherosclerotic heart disease of native coronary artery without angina pectoris: I25.10

## 2023-01-25 NOTE — Assessment & Plan Note (Signed)
No significant interval episodes. Further evaluation for CAD as above.

## 2023-01-25 NOTE — H&P (View-Only) (Signed)
Cardiology Office Note:    Date:  01/25/2023   ID:  Kaitlyn Diaz, DOB 1965-06-11, MRN [id number]  PCP:  Marianne Sofia, PA-C  Cardiologist:  Marlyn Corporal Davian Wollenberg, MD    Referring MD: Marianne Sofia, PA-C   No chief complaint on file.   History of Present Illness:    Kaitlyn Diaz is a 57 y.o. female here for follow-up visit.  Last visit with me in the office was 01-07-2023 for initial consult. Here for the visit accompanied by her daughter and her daughter's friend.  55 year old woman with no prior significant cardiac history and limited functional status due to chronic back pain and was anticipating neurosurgery in near future. Has cardiovascular risk factors with hypertension, hyperlipidemia, COPD, briefly quit smoking a month ago but continues to smoke intermittently now, history of complicated back surgery in 2013.  Frequent falls at home and syncopal episode in her restroom, occasional palpitations also reported.  With atypical chest pain symptoms given her elevated cardiovascular risk factors, proceeded with CT coronary angiogram  CT coronary angiogram ordered shows elevated calcium score 439, severe stenosis of proximal to mid LAD 70 to 99% stenosis with difficult to interpret given the calcified plaque.  Echocardiogram ordered is currently pending.  Event monitor currently pending.  Reports she has been avoiding any significant physical exertion.  Denies any significant chest pain does have shortness of breath with activity at times.  Denies any further syncopal episodes or falls.  She has been smoking on and off, mentions that she has been stressed out about the results.  Has been taking aspirin 81 mg consistently.  EKG in the clinic today shows sinus rhythm with heart rate 74/min, PR interval 146 ms, RSR prime pattern QRS with i right ventricular conduction delay.  No significant change compared to prior EKG from 01-07-2023  Past Medical History:  Diagnosis Date   Anemia     Anxiety    "panic attacks, claustophobic on xanax"   Arthritis    Asthma    Complication of anesthesia    Difficult to put to sleep, Has run fever, "runs in famly"   COPD (chronic obstructive pulmonary disease) (HCC)    Enlarged heart    hx of   Essential (primary) hypertension    Family history of anesthesia complication    malignant hyperthermia on mother's side of family   Generalized anxiety disorder    GERD (gastroesophageal reflux disease)    H/O hiatal hernia    hx of   Headache(784.0)    "due to Blood pressure"   History of melanoma    Hypertension    Dr. [place]ble [place], medical physician [phone]   Malignant hyperthermia    Mixed hyperlipidemia    Neuromuscular disorder (HCC)    "85 % nerve damage in left leg"   Pneumonia    hx of pneumonia x 2   PONV (postoperative nausea and vomiting)    Spinal headache    Urinary tract infection    and kidney infection Hx of    Past Surgical History:  Procedure Laterality Date   ANTERIOR CERVICAL DECOMP/DISCECTOMY FUSION  12/28/2011   Procedure: ANTERIOR CERVICAL DECOMPRESSION/DISCECTOMY FUSION 2 LEVELS;  Surgeon: Mariam Dollar, MD;  Location: [facility];  Service: Neurosurgery;  Laterality: N/A;  HISTORY OF MALIGNANT HYPERTHERMIA Anterior Cervical Discectomy Fusion of Cervical five-six, Cervical six-seven   BLADDER SUSPENSION     BREAST SURGERY     left breast mass removed   hand mass  1994, multiple masses removed - "all benign"   leg mass     left leg  surgically removed   POSTERIOR FUSION LUMBAR SPINE     L4/5   TONSILLECTOMY     and adnoids   TUBAL LIGATION      Current Medications: Current Meds  Medication Sig   albuterol (VENTOLIN HFA) 108 (90 Base) MCG/ACT inhaler Inhale 1 puff into the lungs 3 (three) times daily as needed. For shortness of breath (Patient taking differently: Inhale 1 puff into the lungs 3 (three) times daily as needed for wheezing or shortness of breath. For shortness of breath)    ALPRAZolam (XANAX) 1 MG tablet TAKE 1 TABLET BY MOUTH 3 TIMES DAILY AS NEEDED FOR ANXIETY   amLODipine (NORVASC) 10 MG tablet TAKE 1 TABLET BY MOUTH EVERY DAY (Patient taking differently: Take 10 mg by mouth daily.)   ammonium lactate (AMLACTIN) 12 % cream Apply 1 Application topically as needed for dry skin.   busPIRone (BUSPAR) 15 MG tablet TAKE 1 TABLET BY MOUTH 2 TIMES A DAY (Patient taking differently: Take 15 mg by mouth 2 (two) times daily.)   clobetasol cream (TEMOVATE) 0.05 % Apply 1 Application topically 2 (two) times daily.   doxycycline (VIBRA-TABS) 100 MG tablet Take 100 mg by mouth 2 (two) times daily.   DULoxetine (CYMBALTA) 60 MG capsule TAKE 1 CAPSULE BY MOUTH 2 TIMES DAILY (Patient taking differently: Take 60 mg by mouth 2 (two) times daily.)   etodolac (LODINE) 400 MG tablet Take 400 mg by mouth 2 (two) times daily.   fenofibrate micronized (LOFIBRA) 134 MG capsule TAKE 1 CAPSULE BY MOUTH ONCE DAILY (Patient taking differently: Take 134 mg by mouth daily before breakfast. TAKE 1 CAPSULE BY MOUTH ONCE DAILY)   ferrous sulfate (FEROSUL) 325 (65 FE) MG tablet Take 325 mg by mouth daily with breakfast.   fluticasone furoate-vilanterol (BREO ELLIPTA) 200-25 MCG/ACT AEPB INHALE 1 PUFF INTO LUNGS ONCE DAILY AS DIRECTED (RINSE, GARGLE & SPIT AFTER USE) (Patient taking differently: Inhale 1 puff into the lungs daily.)   furosemide (LASIX) 20 MG tablet TAKE 1 TABLET BY MOUTH EACH DAY (Patient taking differently: Take 20 mg by mouth daily.)   lisinopril (ZESTRIL) 20 MG tablet TAKE ONE TABLET BY MOUTH EACH DAY FOR BLOOD PRESSURE (Patient taking differently: Take 20 mg by mouth daily.)   meloxicam (MOBIC) 7.5 MG tablet TAKE 1 TABLET BY MOUTH EVERY DAY   methocarbamol (ROBAXIN) 750 MG tablet Take 750 mg by mouth 3 (three) times daily.   mupirocin ointment (BACTROBAN) 2 % Apply 1 Application topically 3 (three) times daily.   omeprazole (PRILOSEC) 40 MG capsule TAKE 1 CAPSULE BY MOUTH DAILY  BEFORE A MEAL (Patient taking differently: Take 40 mg by mouth daily. TAKE 1 CAPSULE BY MOUTH DAILY BEFORE A MEAL)   potassium chloride (KLOR-CON) 10 MEQ tablet TAKE 1 TABLET BY MOUTH EVERY DAY   pregabalin (LYRICA) 200 MG capsule Take 200 mg by mouth 3 (three) times daily.   rosuvastatin (CRESTOR) 20 MG tablet Take 1 tablet (20 mg total) by mouth daily.   traMADol (ULTRAM) 50 MG tablet Take 1 tablet (50 mg total) by mouth 2 (two) times daily.   Vitamin D, Ergocalciferol, (DRISDOL) 1.25 MG (50000 UNIT) CAPS capsule Take 1 capsule (50,000 Units total) by mouth every 7 (seven) days.   [DISCONTINUED] metoprolol tartrate (LOPRESSOR) 50 MG tablet Take 50 mg Lopressor 2 hours prior to your CT scan for a heart rate greater than 55.  Allergies:   Amoxicillin, Penicillins, Codeine, Oxycodone, Oxycodone hcl, and Oxycodone hcl   Social History   Socioeconomic History   Marital status: Married    Spouse name: Not on file   Number of children: 2   Years of education: Not on file   Highest education level: GED or equivalent  Occupational History   Occupation: Disabled  Tobacco Use   Smoking status: Every Day    Average packs/day: 0.9 packs/day for 78.0 years (69.5 ttl pk-yrs)    Types: Cigarettes    Start date: 21   Smokeless tobacco: Never  Vaping Use   Vaping status: Some Days   Substances: Nicotine, Flavoring  Substance and Sexual Activity   Alcohol use: Not Currently    Comment: Drinks occasionally, and consumes wine.   Drug use: No   Sexual activity: Not Currently  Other Topics Concern   Not on file  Social History Narrative   Not on file   Social Determinants of Health   Financial Resource Strain: Low Risk  (11/06/2022)   Overall Financial Resource Strain (CARDIA)    Difficulty of Paying Living Expenses: Not hard at all  Food Insecurity: No Food Insecurity (11/14/2022)   Hunger Vital Sign    Worried About Running Out of Food in the Last Year: Never true    Ran Out of Food in  the Last Year: Never true  Transportation Needs: No Transportation Needs (11/14/2022)   PRAPARE - Administrator, Civil Service (Medical): No    Lack of Transportation (Non-Medical): No  Physical Activity: Inactive (11/06/2022)   Exercise Vital Sign    Days of Exercise per Week: 0 days    Minutes of Exercise per Session: 0 min  Stress: No Stress Concern Present (11/06/2022)   [employer] of Occupational Health - Occupational Stress Questionnaire    Feeling of Stress : Not at all  Social Connections: Unknown (01/04/2023)   Received from [facility]   Social Network    Social Network: Not on file  Recent Concern: Social Connections - Moderately Isolated (11/06/2022)   Social Connection and Isolation Panel [NHANES]    Frequency of Communication with Friends and Family: More than three times a week    Frequency of Social Gatherings with Friends and Family: More than three times a week    Attends Religious Services: Never    Database administrator or Organizations: No    Attends Engineer, structural: Never    Marital Status: Married     Family History: The patient's family history includes Colon cancer in her mother; Stomach cancer in her maternal grandfather; Stroke in her maternal grandmother. There is no history of Breast cancer. ROS:   Please see the history of present illness.    All 14 point review of systems negative except as described per history of present illness  EKGs/Labs/Other Studies Reviewed:    EKG Interpretation Date/Time:  Friday January 25 2023 14:23:48 EDT Ventricular Rate:  74 PR Interval:  146 QRS Duration:  88 QT Interval:  378 QTC Calculation: 419 R Axis:   88  Text Interpretation: Unusual P axis, possible ectopic atrial rhythm Nonspecific T wave abnormality Abnormal ECG When compared with ECG of 07-Jan-2023 12:54, Ectopic atrial rhythm has replaced Sinus rhythm Confirmed by Huntley Dec reddy [id number]) on 01/25/2023 2:52:59 PM     Recent Labs: 12/27/2022: ALT 6; Hemoglobin 11.1; Platelets 407; TSH 3.750 01/07/2023: BUN 17; Creatinine, Ser 0.94; Potassium 4.9; Sodium 139  Recent Lipid  Panel    Component Value Date/Time   CHOL 141 09/25/2022 1000   TRIG 126 09/25/2022 1000   HDL 42 09/25/2022 1000   CHOLHDL 3.4 09/25/2022 1000   LDLCALC 76 09/25/2022 1000    Physical Exam:    VS:  BP (!) 98/56 (BP Location: Left Arm, Patient Position: Sitting)   Pulse 74   Ht 5\' 1"  (1.549 m)   Wt 176 lb (79.8 kg)   LMP 06/03/2011   SpO2 91%   BMI 33.25 kg/m     Wt Readings from Last 3 Encounters:  01/25/23 176 lb (79.8 kg)  01/07/23 171 lb 6.4 oz (77.7 kg)  12/27/22 170 lb 3.2 oz (77.2 kg)     GENERAL:  Well nourished, well developed in no acute distress NECK: No JVD; No carotid bruits CARDIAC: RRR, S1 and S2 present, no murmurs, no rubs, no gallops CHEST:  Clear to auscultation without rales, wheezing or rhonchi  Extremities: No pitting pedal edema. Pulses bilaterally symmetric with radial 2+ and dorsalis pedis 2+ NEUROLOGIC:  Alert and oriented x 3   ASSESSMENT AND PLAN:   Ms. Halling 57 year old woman with history of chronic back pain limited functional status, hypertension, hyperlipidemia, COPD, quit smoking about a month ago, mechanical falls and near syncope/syncopal episodes, palpitations, and chest symptoms now with abnormal CT coronary angiogram results and currently pending echocardiogram and event monitor results here to discuss CT coronary angiogram results and review further assessment options.  Problem List Items Addressed This Visit     Tobacco dependence    Advised only to quit smoking.  Discussed harmful effects on the heart and also risk of stroke and malignancies.        Chest pain of uncertain etiology    No significant interval episodes. Further evaluation for CAD as above.       CAD, elevated calcium score 439, severe proximal to mid LAD lesion, difficult to assess given  calcification. - Primary    Discussed further evaluation with definitive assessment by cardiac catheterization.  Continue with aspirin 81 mg once daily, rosuvastatin 20 mg once daily. Advised to avoid moderate to heavy exertion.  Reviewed the CT coronary angiogram results at length.  Discussed further evaluation by heart catheterization for definitive assessment.  Discussed the procedure at length including sedation, vascular access, monitoring.  Shared Decision Making/Informed Consent{ The risks [stroke (1 in 1000), death (1 in 1000), kidney failure [usually temporary] (1 in 500), bleeding (1 in 200), allergic reaction [possibly serious] (1 in 200)], benefits (diagnostic support and management of coronary artery disease) and alternatives of a cardiac catheterization were discussed in detail with her and her family members and she is willing to proceed.  Will tentatively schedule at [facility].          Relevant Orders   EKG 12-Lead (Completed)     Medication Adjustments/Labs and Tests Ordered: Current medicines are reviewed at length with the patient today.  Concerns regarding medicines are outlined above.  Orders Placed This Encounter  Procedures   EKG 12-Lead   Medication changes: No orders of the defined types were placed in this encounter.   Signed, Cecille Amsterdam, MD, MPH, [facility] 01/25/2023 3:21 PM    [facility]

## 2023-01-25 NOTE — Assessment & Plan Note (Signed)
Advised only to quit smoking.  Discussed harmful effects on the heart and also risk of stroke and malignancies.

## 2023-01-25 NOTE — Assessment & Plan Note (Addendum)
Discussed further evaluation with definitive assessment by cardiac catheterization.  Continue with aspirin 81 mg once daily, rosuvastatin 20 mg once daily. Advised to avoid moderate to heavy exertion.  Reviewed the CT coronary angiogram results at length.  Discussed further evaluation by heart catheterization for definitive assessment.  Discussed the procedure at length including sedation, vascular access, monitoring.  Shared Decision Making/Informed Consent{ The risks [stroke (1 in 1000), death (1 in 1000), kidney failure [usually temporary] (1 in 500), bleeding (1 in 200), allergic reaction [possibly serious] (1 in 200)], benefits (diagnostic support and management of coronary artery disease) and alternatives of a cardiac catheterization were discussed in detail with her and her family members and she is willing to proceed.  Will tentatively schedule at [facility].

## 2023-01-25 NOTE — Patient Instructions (Addendum)
Medication Instructions:  Your physician recommends that you continue on your current medications as directed. Please refer to the Current Medication list given to you today.  *If you need a refill on your cardiac medications before your next appointment, please call your pharmacy*   Lab Work: Your physician recommends that you return for lab work in:   Labs today: CBC, BMP  If you have labs (blood work) drawn today and your tests are completely normal, you will receive your results only by: MyChart Message (if you have MyChart) OR A paper copy in the mail If you have any lab test that is abnormal or we need to change your treatment, we will call you to review the results.   Testing/Procedures:  [facility] DEPT OF [facility] [place] [address] [place] [address] Dept: [phone] Loc: [phone]  Kaitlyn Diaz  01/25/2023  You are scheduled for a Cardiac Catheterization on Friday, October 25 with Dr. Peter [place].  1. Please arrive at the [facility] (Main Entrance A) at [facility]: [address] [place], [place] [address] at 8:30 AM (This time is 2 hour(s) before your procedure to ensure your preparation). Free valet parking service is available. You will check in at ADMITTING. The support person will be asked to wait in the waiting room.  It is OK to have someone drop you off and come back when you are ready to be discharged.    Special note: Every effort is made to have your procedure done on time. Please understand that emergencies sometimes delay scheduled procedures.  2. Diet: Do not eat solid foods after midnight.  The patient may have clear liquids until 5am upon the day of the procedure.  3. Labs: You will need to have blood drawn on Friday, October 18 at [facility]: [address] [place], [place] . You do not need to be fasting.  4. Medication instructions in preparation for your  procedure:  Hold Lasix the morning of your procedure.   Contrast Allergy: No  On the morning of your procedure, take your Aspirin 81 mg and any morning medicines NOT listed above.  You may use sips of water.  5. Plan to go home the same day, you will only stay overnight if medically necessary. 6. Bring a current list of your medications and current insurance cards. 7. You MUST have a responsible person to drive you home. 8. Someone MUST be with you the first 24 hours after you arrive home or your discharge will be delayed. 9. Please wear clothes that are easy to get on and off and wear slip-on shoes.  Thank you for allowing [place] to care for you!   --  Invasive Cardiovascular services    Follow-Up: At [facility], you and your health needs are our priority.  As part of our continuing mission to provide you with exceptional heart care, we have created designated Provider Care Teams.  These Care Teams include your primary Cardiologist (physician) and Advanced Practice Providers (APPs -  Physician Assistants and Nurse Practitioners) who all work together to provide you with the care you need, when you need it.  We recommend signing up for the patient portal called "MyChart".  Sign up information is provided on this After Visit Summary.  MyChart is used to connect with patients for Virtual Visits (Telemedicine).  Patients are able to view lab/test results, encounter notes, upcoming appointments, etc.  Non-urgent messages can be sent to your provider as well.   To learn more about what you can do with MyChart, go to [email or web address].    Your next appointment:   Follow up to be determined after testing  Provider:   Huntley Dec, MD    Other Instructions None

## 2023-01-25 NOTE — Progress Notes (Signed)
 Cardiology Office Note:    Date:  01/25/2023   ID:  Kaitlyn Diaz, DOB [birth year], MRN [id number]  PCP:  Nicholaus Credit, PA-C  Cardiologist:  Alean JONELLE Wynter Isaacs, MD    Referring MD: Nicholaus Credit, PA-C   No chief complaint on file.   History of Present Illness:    Kaitlyn Diaz is a 57 y.o. female here for follow-up visit.  Last visit with me in the office was 01-07-2023 for initial consult. Here for the visit accompanied by her daughter and her daughter's friend.  42 year old woman with no prior significant cardiac history and limited functional status due to chronic back pain and was anticipating neurosurgery in near future. Has cardiovascular risk factors with hypertension, hyperlipidemia, COPD, briefly quit smoking a month ago but continues to smoke intermittently now, history of complicated back surgery in 2013.  Frequent falls at home and syncopal episode in her restroom, occasional palpitations also reported.  With atypical chest pain symptoms given her elevated cardiovascular risk factors, proceeded with CT coronary angiogram  CT coronary angiogram ordered shows elevated calcium  score 439, severe stenosis of proximal to mid LAD 70 to 99% stenosis with difficult to interpret given the calcified plaque.  Echocardiogram ordered is currently pending.  Event monitor currently pending.  Reports she has been avoiding any significant physical exertion.  Denies any significant chest pain does have shortness of breath with activity at times.  Denies any further syncopal episodes or falls.  She has been smoking on and off, mentions that she has been stressed out about the results.  Has been taking aspirin  81 mg consistently.  EKG in the clinic today shows sinus rhythm with heart rate 74/min, PR interval 146 ms, RSR prime pattern QRS with i right ventricular conduction delay.  No significant change compared to prior EKG from 01-07-2023  Past Medical History:  Diagnosis Date   Anemia     Anxiety    "panic attacks, claustophobic on xanax "   Arthritis    Asthma    Complication of anesthesia    Difficult to put to sleep, Has run fever, "runs in famly"   COPD (chronic obstructive pulmonary disease) (HCC)    Enlarged heart    hx of   Essential (primary) hypertension    Family history of anesthesia complication    malignant hyperthermia on mother's side of family   Generalized anxiety disorder    GERD (gastroesophageal reflux disease)    H/O hiatal hernia    hx of   Headache(784.0)    "due to Blood pressure"   History of melanoma    Hypertension    Dr. Rexene Crazier, medical physician [phone]   Malignant hyperthermia    Mixed hyperlipidemia    Neuromuscular disorder (HCC)    "85 % nerve damage in left leg"   Pneumonia    hx of pneumonia x 2   PONV (postoperative nausea and vomiting)    Spinal headache    Urinary tract infection    and kidney infection Hx of    Past Surgical History:  Procedure Laterality Date   ANTERIOR CERVICAL DECOMP/DISCECTOMY FUSION  12/28/2011   Procedure: ANTERIOR CERVICAL DECOMPRESSION/DISCECTOMY FUSION 2 LEVELS;  Surgeon: Arley SHAUNNA Helling, MD;  Location: [facility];  Service: Neurosurgery;  Laterality: N/A;  HISTORY OF MALIGNANT HYPERTHERMIA Anterior Cervical Discectomy Fusion of Cervical five-six, Cervical six-seven   BLADDER SUSPENSION     BREAST SURGERY     left breast mass removed   hand mass  1994, multiple masses removed - "all benign"   leg mass     left leg  surgically removed   POSTERIOR FUSION LUMBAR SPINE     L4/5   TONSILLECTOMY     and adnoids   TUBAL LIGATION      Current Medications: Current Meds  Medication Sig   albuterol  (VENTOLIN  HFA) 108 (90 Base) MCG/ACT inhaler Inhale 1 puff into the lungs 3 (three) times daily as needed. For shortness of breath (Patient taking differently: Inhale 1 puff into the lungs 3 (three) times daily as needed for wheezing or shortness of breath. For shortness of breath)    ALPRAZolam  (XANAX ) 1 MG tablet TAKE 1 TABLET BY MOUTH 3 TIMES DAILY AS NEEDED FOR ANXIETY   amLODipine  (NORVASC ) 10 MG tablet TAKE 1 TABLET BY MOUTH EVERY DAY (Patient taking differently: Take 10 mg by mouth daily.)   ammonium lactate (AMLACTIN) 12 % cream Apply 1 Application topically as needed for dry skin.   busPIRone  (BUSPAR ) 15 MG tablet TAKE 1 TABLET BY MOUTH 2 TIMES A DAY (Patient taking differently: Take 15 mg by mouth 2 (two) times daily.)   clobetasol cream (TEMOVATE) 0.05 % Apply 1 Application topically 2 (two) times daily.   doxycycline (VIBRA-TABS) 100 MG tablet Take 100 mg by mouth 2 (two) times daily.   DULoxetine  (CYMBALTA ) 60 MG capsule TAKE 1 CAPSULE BY MOUTH 2 TIMES DAILY (Patient taking differently: Take 60 mg by mouth 2 (two) times daily.)   etodolac  (LODINE ) 400 MG tablet Take 400 mg by mouth 2 (two) times daily.   fenofibrate  micronized (LOFIBRA) 134 MG capsule TAKE 1 CAPSULE BY MOUTH ONCE DAILY (Patient taking differently: Take 134 mg by mouth daily before breakfast. TAKE 1 CAPSULE BY MOUTH ONCE DAILY)   ferrous sulfate  (FEROSUL) 325 (65 FE) MG tablet Take 325 mg by mouth daily with breakfast.   fluticasone  furoate-vilanterol (BREO ELLIPTA ) 200-25 MCG/ACT AEPB INHALE 1 PUFF INTO LUNGS ONCE DAILY AS DIRECTED (RINSE, GARGLE & SPIT AFTER USE) (Patient taking differently: Inhale 1 puff into the lungs daily.)   furosemide  (LASIX ) 20 MG tablet TAKE 1 TABLET BY MOUTH EACH DAY (Patient taking differently: Take 20 mg by mouth daily.)   lisinopril  (ZESTRIL ) 20 MG tablet TAKE ONE TABLET BY MOUTH EACH DAY FOR BLOOD PRESSURE (Patient taking differently: Take 20 mg by mouth daily.)   meloxicam  (MOBIC ) 7.5 MG tablet TAKE 1 TABLET BY MOUTH EVERY DAY   methocarbamol (ROBAXIN) 750 MG tablet Take 750 mg by mouth 3 (three) times daily.   mupirocin ointment (BACTROBAN) 2 % Apply 1 Application topically 3 (three) times daily.   omeprazole  (PRILOSEC) 40 MG capsule TAKE 1 CAPSULE BY MOUTH DAILY  BEFORE A MEAL (Patient taking differently: Take 40 mg by mouth daily. TAKE 1 CAPSULE BY MOUTH DAILY BEFORE A MEAL)   potassium chloride  (KLOR-CON ) 10 MEQ tablet TAKE 1 TABLET BY MOUTH EVERY DAY   pregabalin  (LYRICA ) 200 MG capsule Take 200 mg by mouth 3 (three) times daily.   rosuvastatin  (CRESTOR ) 20 MG tablet Take 1 tablet (20 mg total) by mouth daily.   traMADol  (ULTRAM ) 50 MG tablet Take 1 tablet (50 mg total) by mouth 2 (two) times daily.   Vitamin D , Ergocalciferol , (DRISDOL ) 1.25 MG (50000 UNIT) CAPS capsule Take 1 capsule (50,000 Units total) by mouth every 7 (seven) days.   [DISCONTINUED] metoprolol  tartrate (LOPRESSOR ) 50 MG tablet Take 50 mg Lopressor  2 hours prior to your CT scan for a heart rate greater than 55.  Allergies:   Amoxicillin, Penicillins, Codeine, Oxycodone , Oxycodone  hcl, and Oxycodone  hcl   Social History   Socioeconomic History   Marital status: Married    Spouse name: Not on file   Number of children: 2   Years of education: Not on file   Highest education level: GED or equivalent  Occupational History   Occupation: Disabled  Tobacco Use   Smoking status: Every Day    Average packs/day: 0.9 packs/day for 78.0 years (69.5 ttl pk-yrs)    Types: Cigarettes    Start date: 58   Smokeless tobacco: Never  Vaping Use   Vaping status: Some Days   Substances: Nicotine , Flavoring  Substance and Sexual Activity   Alcohol use: Not Currently    Comment: Drinks occasionally, and consumes wine.   Drug use: No   Sexual activity: Not Currently  Other Topics Concern   Not on file  Social History Narrative   Not on file   Social Determinants of Health   Financial Resource Strain: Low Risk  (11/06/2022)   Overall Financial Resource Strain (CARDIA)    Difficulty of Paying Living Expenses: Not hard at all  Food Insecurity: No Food Insecurity (11/14/2022)   Hunger Vital Sign    Worried About Running Out of Food in the Last Year: Never true    Ran Out of Food in  the Last Year: Never true  Transportation Needs: No Transportation Needs (11/14/2022)   PRAPARE - Administrator, Civil Service (Medical): No    Lack of Transportation (Non-Medical): No  Physical Activity: Inactive (11/06/2022)   Exercise Vital Sign    Days of Exercise per Week: 0 days    Minutes of Exercise per Session: 0 min  Stress: No Stress Concern Present (11/06/2022)   [employer] of Occupational Health - Occupational Stress Questionnaire    Feeling of Stress : Not at all  Social Connections: Unknown (01/04/2023)   Received from [facility]   Social Network    Social Network: Not on file  Recent Concern: Social Connections - Moderately Isolated (11/06/2022)   Social Connection and Isolation Panel [NHANES]    Frequency of Communication with Friends and Family: More than three times a week    Frequency of Social Gatherings with Friends and Family: More than three times a week    Attends Religious Services: Never    Database administrator or Organizations: No    Attends Engineer, structural: Never    Marital Status: Married     Family History: The patient's family history includes Colon cancer in her mother; Stomach cancer in her maternal grandfather; Stroke in her maternal grandmother. There is no history of Breast cancer. ROS:   Please see the history of present illness.    All 14 point review of systems negative except as described per history of present illness  EKGs/Labs/Other Studies Reviewed:    EKG Interpretation Date/Time:  Friday January 25 2023 14:23:48 EDT Ventricular Rate:  74 PR Interval:  146 QRS Duration:  88 QT Interval:  378 QTC Calculation: 419 R Axis:   88  Text Interpretation: Unusual P axis, possible ectopic atrial rhythm Nonspecific T wave abnormality Abnormal ECG When compared with ECG of 07-Jan-2023 12:54, Ectopic atrial rhythm has replaced Sinus rhythm Confirmed by Liborio Hai reddy [phone]) on 01/25/2023 2:52:59 PM     Recent Labs: 12/27/2022: ALT 6; Hemoglobin 11.1; Platelets 407; TSH 3.750 01/07/2023: BUN 17; Creatinine, Ser 0.94; Potassium 4.9; Sodium 139  Recent Lipid  Panel    Component Value Date/Time   CHOL 141 09/25/2022 1000   TRIG 126 09/25/2022 1000   HDL 42 09/25/2022 1000   CHOLHDL 3.4 09/25/2022 1000   LDLCALC 76 09/25/2022 1000    Physical Exam:    VS:  BP (!) 98/56 (BP Location: Left Arm, Patient Position: Sitting)   Pulse 74   Ht 5\' 1"  (1.549 m)   Wt 176 lb (79.8 kg)   LMP 06/03/2011   SpO2 91%   BMI 33.25 kg/m     Wt Readings from Last 3 Encounters:  01/25/23 176 lb (79.8 kg)  01/07/23 171 lb 6.4 oz (77.7 kg)  12/27/22 170 lb 3.2 oz (77.2 kg)     GENERAL:  Well nourished, well developed in no acute distress NECK: No JVD; No carotid bruits CARDIAC: RRR, S1 and S2 present, no murmurs, no rubs, no gallops CHEST:  Clear to auscultation without rales, wheezing or rhonchi  Extremities: No pitting pedal edema. Pulses bilaterally symmetric with radial 2+ and dorsalis pedis 2+ NEUROLOGIC:  Alert and oriented x 3   ASSESSMENT AND PLAN:   Ms. Rembold 57 year old woman with history of chronic back pain limited functional status, hypertension, hyperlipidemia, COPD, quit smoking about a month ago, mechanical falls and near syncope/syncopal episodes, palpitations, and chest symptoms now with abnormal CT coronary angiogram results and currently pending echocardiogram and event monitor results here to discuss CT coronary angiogram results and review further assessment options.  Problem List Items Addressed This Visit     Tobacco dependence    Advised only to quit smoking.  Discussed harmful effects on the heart and also risk of stroke and malignancies.        Chest pain of uncertain etiology    No significant interval episodes. Further evaluation for CAD as above.       CAD, elevated calcium  score 439, severe proximal to mid LAD lesion, difficult to assess given  calcification. - Primary    Discussed further evaluation with definitive assessment by cardiac catheterization.  Continue with aspirin  81 mg once daily, rosuvastatin  20 mg once daily. Advised to avoid moderate to heavy exertion.  Reviewed the CT coronary angiogram results at length.  Discussed further evaluation by heart catheterization for definitive assessment.  Discussed the procedure at length including sedation, vascular access, monitoring.  Shared Decision Making/Informed Consent{ The risks [stroke (1 in 1000), death (1 in 1000), kidney failure [usually temporary] (1 in 500), bleeding (1 in 200), allergic reaction [possibly serious] (1 in 200)], benefits (diagnostic support and management of coronary artery disease) and alternatives of a cardiac catheterization were discussed in detail with her and her family members and she is willing to proceed.  Will tentatively schedule at [facility].          Relevant Orders   EKG 12-Lead (Completed)     Medication Adjustments/Labs and Tests Ordered: Current medicines are reviewed at length with the patient today.  Concerns regarding medicines are outlined above.  Orders Placed This Encounter  Procedures   EKG 12-Lead   Medication changes: No orders of the defined types were placed in this encounter.   Signed, Alean jess Kobus, MD, MPH, [facility] 01/25/2023 3:21 PM    [facility]

## 2023-01-26 ENCOUNTER — Telehealth: Payer: Self-pay | Admitting: Physician Assistant

## 2023-01-26 NOTE — Telephone Encounter (Signed)
I got a page from [employer] listing her name and birthday.  They were not able to find any abnormal labs and the woman I talked to did not know why I was paged.  However, upon reviewing her labs I saw a BUN and creatinine that were higher than normal for her as well as potassium level of 6.1.  I phoned both she and her husband.  I left her a message on her only active number which is [phone].  I then tried to call her husband.  The [phone] number is not in service but I was able to leave a message on the [phone].  I left a message requesting that she go to the closest emergency room or urgent care to get this value rechecked.  I also requested that she stop taking her potassium.  Upon further review of her medications, she is also on Lasix and lisinopril.  I called back her number to ask her to hold both of those as well.      Latest Ref Rng & Units 01/25/2023    3:41 PM 01/07/2023    2:14 PM 12/27/2022    2:36 PM  BMP  Glucose 70 - 99 mg/dL 95  79  99   BUN 6 - 24 mg/dL 16  17  12    Creatinine 0.57 - 1.00 mg/dL 5.78  4.69  6.29   BUN/Creat Ratio 9 - 23 12  18  16    Sodium 134 - 144 mmol/L 138  139  142   Potassium 3.5 - 5.2 mmol/L 6.1  4.9  4.9   Chloride 96 - 106 mmol/L 101  100  101   CO2 20 - 29 mmol/L 21  22  25    Calcium 8.7 - 10.2 mg/dL 9.8  9.9  9.8     Shari Natt, PA-C 01/26/2023 5:26 PM

## 2023-01-27 DIAGNOSIS — R42 Dizziness and giddiness: Secondary | ICD-10-CM | POA: Diagnosis not present

## 2023-01-27 DIAGNOSIS — I517 Cardiomegaly: Secondary | ICD-10-CM | POA: Diagnosis not present

## 2023-01-27 DIAGNOSIS — R918 Other nonspecific abnormal finding of lung field: Secondary | ICD-10-CM | POA: Diagnosis not present

## 2023-01-27 DIAGNOSIS — B379 Candidiasis, unspecified: Secondary | ICD-10-CM | POA: Diagnosis not present

## 2023-01-27 DIAGNOSIS — N3000 Acute cystitis without hematuria: Secondary | ICD-10-CM | POA: Diagnosis not present

## 2023-01-27 DIAGNOSIS — E875 Hyperkalemia: Secondary | ICD-10-CM | POA: Diagnosis not present

## 2023-01-27 DIAGNOSIS — R9431 Abnormal electrocardiogram [ECG] [EKG]: Secondary | ICD-10-CM | POA: Diagnosis not present

## 2023-01-27 NOTE — Telephone Encounter (Signed)
10/20  I called the pt and her husband again this am. No answer, left another voicemail to each of them.   Theodore Demark, PA-C 01/27/2023 9:51 AM

## 2023-01-28 ENCOUNTER — Telehealth: Payer: Self-pay

## 2023-01-28 ENCOUNTER — Other Ambulatory Visit: Payer: Self-pay | Admitting: Physician Assistant

## 2023-01-28 ENCOUNTER — Telehealth: Payer: Self-pay | Admitting: Internal Medicine

## 2023-01-28 DIAGNOSIS — F411 Generalized anxiety disorder: Secondary | ICD-10-CM

## 2023-01-28 LAB — BASIC METABOLIC PANEL
BUN/Creatinine Ratio: 12 (ref 9–23)
BUN: 16 mg/dL (ref 6–24)
CO2: 21 mmol/L (ref 20–29)
Calcium: 9.8 mg/dL (ref 8.7–10.2)
Chloride: 101 mmol/L (ref 96–106)
Creatinine, Ser: 1.29 mg/dL — ABNORMAL HIGH (ref 0.57–1.00)
Glucose: 95 mg/dL (ref 70–99)
Potassium: 6.1 mmol/L (ref 3.5–5.2)
Sodium: 138 mmol/L (ref 134–144)
eGFR: 48 mL/min/{1.73_m2} — ABNORMAL LOW (ref >59)

## 2023-01-28 LAB — CBC
Hematocrit: 36 % (ref 34.0–46.6)
Hemoglobin: 11.6 g/dL (ref 11.1–15.9)
MCH: 29.7 pg (ref 26.6–33.0)
MCHC: 32.2 g/dL (ref 31.5–35.7)
MCV: 92 fL (ref 79–97)
Platelets: 324 10*3/uL (ref 150–450)
RBC: 3.9 x10E6/uL (ref 3.77–5.28)
RDW: 14.2 % (ref 11.7–15.4)
WBC: 6.7 10*3/uL (ref 3.4–10.8)

## 2023-01-28 NOTE — Telephone Encounter (Signed)
Patient's sister is calling about her sister's procedure tomorrow.  She wants to know what medications she needs to stop taking. Please advise.

## 2023-01-28 NOTE — Telephone Encounter (Signed)
Patient called and stated she went to hospital yesterday, due to her potassium was very high and she does have a blockage and she is having heart cath done on Friday.  She needs a refill on her xanax due to her nerves being so bad, she is out.   Also printed out [facility] visit.

## 2023-01-28 NOTE — Telephone Encounter (Signed)
Spoke with sister Mardene Sayer per [place]. She stated that the pt is having issues with her phone and asked that we call the sister's number for instructions or information until the issues is resolved. Message sent to Katina Dung for Cath instruction as well.

## 2023-01-29 ENCOUNTER — Other Ambulatory Visit: Payer: Self-pay | Admitting: *Deleted

## 2023-01-29 ENCOUNTER — Telehealth: Payer: Self-pay | Admitting: *Deleted

## 2023-01-29 ENCOUNTER — Encounter: Payer: Self-pay | Admitting: Physician Assistant

## 2023-01-29 DIAGNOSIS — Z01812 Encounter for preprocedural laboratory examination: Secondary | ICD-10-CM

## 2023-01-29 DIAGNOSIS — E875 Hyperkalemia: Secondary | ICD-10-CM

## 2023-01-29 NOTE — Telephone Encounter (Signed)
Cardiac Catheterization scheduled at [facility] for: Friday February 01, 2023 10:30 AM Arrival time [facility] Main Entrance A at: 8:30 AM  Nothing to eat after midnight prior to procedure, clear liquids until 5 AM day of procedure.  Medication instructions: -Usual morning medications can be taken with sips of water including aspirin 81 mg.  Plan to go home the same day, you will only stay overnight if medically necessary.  You must have responsible adult to drive you home.  Someone must be with you the first 24 hours after you arrive home.  Reviewed procedure instructions with patient's sister (DPR), Kim.  Kim reports patient has decreased lisinopril to 10 mg daily (see 01/26/23 phone note), is not currently taking lasix or potassium supplements, will plan to take patient to have  BMP done 01/30/23 at [facility] lab.

## 2023-01-30 ENCOUNTER — Ambulatory Visit: Payer: Medicare HMO | Admitting: Physician Assistant

## 2023-01-30 DIAGNOSIS — Z01812 Encounter for preprocedural laboratory examination: Secondary | ICD-10-CM | POA: Diagnosis not present

## 2023-01-30 DIAGNOSIS — E875 Hyperkalemia: Secondary | ICD-10-CM | POA: Diagnosis not present

## 2023-01-31 LAB — BASIC METABOLIC PANEL
BUN/Creatinine Ratio: 17 (ref 9–23)
BUN: 16 mg/dL (ref 6–24)
CO2: 25 mmol/L (ref 20–29)
Calcium: 9.8 mg/dL (ref 8.7–10.2)
Chloride: 100 mmol/L (ref 96–106)
Creatinine, Ser: 0.93 mg/dL (ref 0.57–1.00)
Glucose: 81 mg/dL (ref 70–99)
Potassium: 5.1 mmol/L (ref 3.5–5.2)
Sodium: 137 mmol/L (ref 134–144)
eGFR: 71 mL/min/{1.73_m2} (ref >59)

## 2023-01-31 NOTE — Telephone Encounter (Signed)
Reviewed procedure instructions with patient's sister (DPR), Kim.

## 2023-02-01 ENCOUNTER — Ambulatory Visit (HOSPITAL_COMMUNITY)
Admission: RE | Disposition: A | Discharge status: Home / Self Care | Payer: Self-pay | Source: Home / Self Care | Attending: Cardiology

## 2023-02-01 ENCOUNTER — Other Ambulatory Visit: Payer: Self-pay

## 2023-02-01 ENCOUNTER — Ambulatory Visit (HOSPITAL_COMMUNITY)
Admission: RE | Admit: 2023-02-01 | Discharge: 2023-02-01 | Disposition: A | Discharge status: Home / Self Care | Payer: Medicare HMO | Attending: Cardiology | Admitting: Cardiology

## 2023-02-01 DIAGNOSIS — J4489 Other specified chronic obstructive pulmonary disease: Secondary | ICD-10-CM | POA: Diagnosis not present

## 2023-02-01 DIAGNOSIS — E785 Hyperlipidemia, unspecified: Secondary | ICD-10-CM | POA: Diagnosis not present

## 2023-02-01 DIAGNOSIS — I2510 Atherosclerotic heart disease of native coronary artery without angina pectoris: Secondary | ICD-10-CM | POA: Diagnosis not present

## 2023-02-01 DIAGNOSIS — Z79899 Other long term (current) drug therapy: Secondary | ICD-10-CM | POA: Diagnosis not present

## 2023-02-01 DIAGNOSIS — F17210 Nicotine dependence, cigarettes, uncomplicated: Secondary | ICD-10-CM | POA: Diagnosis not present

## 2023-02-01 DIAGNOSIS — G8929 Other chronic pain: Secondary | ICD-10-CM | POA: Diagnosis not present

## 2023-02-01 DIAGNOSIS — R0789 Other chest pain: Secondary | ICD-10-CM | POA: Insufficient documentation

## 2023-02-01 DIAGNOSIS — I2584 Coronary atherosclerosis due to calcified coronary lesion: Secondary | ICD-10-CM | POA: Insufficient documentation

## 2023-02-01 DIAGNOSIS — Z7982 Long term (current) use of aspirin: Secondary | ICD-10-CM | POA: Diagnosis not present

## 2023-02-01 DIAGNOSIS — R296 Repeated falls: Secondary | ICD-10-CM | POA: Diagnosis not present

## 2023-02-01 DIAGNOSIS — I10 Essential (primary) hypertension: Secondary | ICD-10-CM | POA: Insufficient documentation

## 2023-02-01 DIAGNOSIS — I25119 Atherosclerotic heart disease of native coronary artery with unspecified angina pectoris: Secondary | ICD-10-CM

## 2023-02-01 DIAGNOSIS — R079 Chest pain, unspecified: Secondary | ICD-10-CM | POA: Diagnosis not present

## 2023-02-01 HISTORY — PX: LEFT HEART CATH AND CORONARY ANGIOGRAPHY: CATH118249

## 2023-02-01 SURGERY — LEFT HEART CATH AND CORONARY ANGIOGRAPHY
Anesthesia: LOCAL

## 2023-02-01 MED ORDER — LIDOCAINE HCL (PF) 1 % IJ SOLN
INTRAMUSCULAR | Status: DC | PRN
  Administered 2023-02-01: 5 mL

## 2023-02-01 MED ORDER — SODIUM CHLORIDE 0.9 % IV SOLN
250.0000 mL | INTRAVENOUS | Status: DC | PRN
Start: 2023-02-01 — End: 2023-02-01

## 2023-02-01 MED ORDER — SODIUM CHLORIDE 0.9% FLUSH: 3.0000 mL | Freq: Two times a day (BID) | INTRAVENOUS | Status: DC

## 2023-02-01 MED ORDER — ACETAMINOPHEN 325 MG PO TABS: 650.0000 mg | ORAL_TABLET | ORAL | Status: DC | PRN

## 2023-02-01 MED ORDER — ONDANSETRON HCL 4 MG/2ML IJ SOLN: 4.0000 mg | Freq: Four times a day (QID) | INTRAMUSCULAR | Status: DC | PRN

## 2023-02-01 MED ORDER — HEPARIN SODIUM (PORCINE) 1000 UNIT/ML IJ SOLN
INTRAMUSCULAR | Status: AC
  Filled 2023-02-01: qty 10

## 2023-02-01 MED ORDER — SODIUM CHLORIDE 0.9% FLUSH: 3.0000 mL | INTRAVENOUS | Status: DC | PRN

## 2023-02-01 MED ORDER — LIDOCAINE HCL (PF) 1 % IJ SOLN
INTRAMUSCULAR | Status: AC
  Filled 2023-02-01: qty 30

## 2023-02-01 MED ORDER — ASPIRIN 81 MG PO CHEW: 81.0000 mg | CHEWABLE_TABLET | ORAL | Status: DC

## 2023-02-01 MED ORDER — MIDAZOLAM HCL 2 MG/2ML IJ SOLN
INTRAMUSCULAR | Status: DC | PRN
  Administered 2023-02-01: 1 mg via INTRAVENOUS

## 2023-02-01 MED ORDER — HEPARIN (PORCINE) IN NACL 1000-0.9 UT/500ML-% IV SOLN
INTRAVENOUS | Status: DC | PRN
  Administered 2023-02-01 (×2): 500 mL

## 2023-02-01 MED ORDER — VERAPAMIL HCL 2.5 MG/ML IV SOLN
INTRAVENOUS | Status: DC | PRN
  Administered 2023-02-01: 10 mL via INTRA_ARTERIAL

## 2023-02-01 MED ORDER — SODIUM CHLORIDE 0.9 % WEIGHT BASED INFUSION: 1.0000 mL/kg/h | INTRAVENOUS | Status: DC

## 2023-02-01 MED ORDER — VERAPAMIL HCL 2.5 MG/ML IV SOLN
INTRAVENOUS | Status: AC
  Filled 2023-02-01: qty 2

## 2023-02-01 MED ORDER — HEPARIN SODIUM (PORCINE) 1000 UNIT/ML IJ SOLN
INTRAMUSCULAR | Status: DC | PRN
  Administered 2023-02-01: 4000 [IU] via INTRAVENOUS

## 2023-02-01 MED ORDER — HYDRALAZINE HCL 20 MG/ML IJ SOLN: 10.0000 mg | INTRAMUSCULAR | Status: DC | PRN

## 2023-02-01 MED ORDER — IOHEXOL 350 MG/ML SOLN
INTRAVENOUS | Status: DC | PRN
  Administered 2023-02-01: 45 mL

## 2023-02-01 MED ORDER — SODIUM CHLORIDE 0.9 % WEIGHT BASED INFUSION
3.0000 mL/kg/h | INTRAVENOUS | Status: AC
  Administered 2023-02-01: 3 mL/kg/h via INTRAVENOUS

## 2023-02-01 MED ORDER — FENTANYL CITRATE (PF) 100 MCG/2ML IJ SOLN
INTRAMUSCULAR | Status: AC
  Filled 2023-02-01: qty 2

## 2023-02-01 MED ORDER — MIDAZOLAM HCL 2 MG/2ML IJ SOLN
INTRAMUSCULAR | Status: AC
  Filled 2023-02-01: qty 2

## 2023-02-01 MED ORDER — FENTANYL CITRATE (PF) 100 MCG/2ML IJ SOLN
INTRAMUSCULAR | Status: DC | PRN
  Administered 2023-02-01: 25 ug via INTRAVENOUS

## 2023-02-01 SURGICAL SUPPLY — 8 items
CATH 5FR JL3.5 JR4 ANG PIG MP (CATHETERS) IMPLANT
DEVICE RAD COMP TR BAND LRG (VASCULAR PRODUCTS) IMPLANT
GLIDESHEATH SLEND SS 6F .021 (SHEATH) IMPLANT
GUIDEWIRE INQWIRE 1.5J.035X260 (WIRE) IMPLANT
INQWIRE 1.5J .035X260CM (WIRE) ×1
PACK CARDIAC CATHETERIZATION (CUSTOM PROCEDURE TRAY) ×1 IMPLANT
SET ATX-X65L (MISCELLANEOUS) IMPLANT
SHEATH PROBE COVER 6X72 (BAG) IMPLANT

## 2023-02-01 NOTE — Interval H&P Note (Signed)
History and Physical Interval Note:  02/01/2023 10:58 AM  Kaitlyn Diaz  has presented today for surgery, with the diagnosis of cad.  The various methods of treatment have been discussed with the patient and family. After consideration of risks, benefits and other options for treatment, the patient has consented to  Procedure(s): LEFT HEART CATH AND CORONARY ANGIOGRAPHY (N/A) as a surgical intervention.  The patient's history has been reviewed, patient examined, no change in status, stable for surgery.  I have reviewed the patient's chart and labs.  Questions were answered to the patient's satisfaction.   Cath Lab Visit (complete for each Cath Lab visit)  Clinical Evaluation Leading to the Procedure:   ACS: No.  Non-ACS:    Anginal Classification: CCS II  Anti-ischemic medical therapy: Minimal Therapy (1 class of medications)  Non-Invasive Test Results: Intermediate-risk stress test findings: cardiac mortality 1-3%/year  Prior CABG: No previous CABG        Kaitlyn Diaz [facility] 02/01/2023 10:58 AM

## 2023-02-01 NOTE — Discharge Instructions (Signed)

## 2023-02-01 NOTE — Progress Notes (Signed)
Patient and daughter was given discharge instructions. Both verbalized understanding. 

## 2023-02-04 ENCOUNTER — Encounter (HOSPITAL_COMMUNITY): Payer: Self-pay | Admitting: Cardiology

## 2023-02-04 ENCOUNTER — Ambulatory Visit: Payer: Medicare Other | Admitting: Physician Assistant

## 2023-02-05 ENCOUNTER — Telehealth: Payer: Self-pay

## 2023-02-05 MED FILL — Lidocaine HCl Local Preservative Free (PF) Inj 1%: INTRAMUSCULAR | Qty: 30 | Status: AC

## 2023-02-05 NOTE — Telephone Encounter (Signed)
Pt wants to know how long it takes for an artery to close up and when can she change the gauze. Please advise

## 2023-02-07 NOTE — Telephone Encounter (Signed)
Left vm to return call.    

## 2023-02-08 ENCOUNTER — Ambulatory Visit: Payer: Medicare HMO

## 2023-02-08 DIAGNOSIS — R079 Chest pain, unspecified: Secondary | ICD-10-CM | POA: Diagnosis not present

## 2023-02-08 DIAGNOSIS — R55 Syncope and collapse: Secondary | ICD-10-CM

## 2023-02-08 LAB — ECHOCARDIOGRAM COMPLETE
Area-P 1/2: 2.68 cm2
MV M vel: 4.94 m/s
MV Peak grad: 97.6 mm[Hg]
P 1/2 time: 508 ms
S' Lateral: 3.1 cm

## 2023-02-08 NOTE — Telephone Encounter (Signed)
Pt was in for Echo this morning and stopped by nurses desk. She had no concerns with cath site. She had some mild diminishing bruising and had no complaints. Instructed how to remove monitor, charge phone and send it in.

## 2023-02-12 ENCOUNTER — Telehealth: Payer: Self-pay

## 2023-02-12 ENCOUNTER — Other Ambulatory Visit: Payer: Self-pay | Admitting: Hematology and Oncology

## 2023-02-12 DIAGNOSIS — D649 Anemia, unspecified: Secondary | ICD-10-CM

## 2023-02-12 NOTE — Telephone Encounter (Signed)
LVM for pt to call regarding Echo results

## 2023-02-12 NOTE — Progress Notes (Deleted)
[facility] [address] [place],  [place]  [address] [phone]  Clinic Day:  02/12/2023  Patient Care Team: Marianne Sofia, Cordelia Poche as PCP - General (Physician Assistant) Ines Bloomer, MD (Thoracic Surgery)   CHIEF COMPLAINT:  CC: Anemia  HISTORY OF PRESENT ILLNESS:  Kaitlyn Diaz is a 57 y.o. female with anemia  Medical history notable for Iron deficiency anemia, hypertension, hyperlipidemia, COPD, hiatal hernia, GERD, essential tremor, osteoarthritis degenerative disc disease, nerve damage of the left leg, and anxiety.  History of positive ANA previously evaluated by rheumatology.  She gives a history of melanoma of the right proximal arm, as well as treatment of multiple other skin lesions.  Status post lumbar and cervical fusion.  She had screening mammogram in July that did not reveal any evidence of malignancy.  She is overdue for CT lung cancer screening (last done in March 2023).  Last pelvic examination and Pap smear was in April 2022 with normal Pap.    September 2023: Labs at PCP hemoglobin of 10.8 with an MCV of 91.  White count was 8 with a normal differential.  Platelets 310,000.  Ferritin was 14.  B12 and folate were normal.  She was placed on oral iron supplementation twice daily with improvement in her hemoglobin to 13 and ferritin and 40.    February 2024: Hemoglobin 11.3 with an MCV of 94, but ferritin remained 92.  B12 and folate were normal again at that time.  In June, her hemoglobin was 11.8 with an MCV of 92.  Ferritin 39.  In   July 2024: Hemoglobin 10.9 with an MCV of 89.  Ferritin of 81. The patient requested referral here for consideration of IV iron.   November 14, 2022: Hematology Consult [facility]  -She reports fatigue and pica to ice.  She reports intermittent mild shortness of breath.  She denies lightheadedness.  She reports easy bruising, but no abnormal bleeding.  She denies nosebleeds, gum bleeds, hematuria, vaginal  bleeding or hematochezia.  She states her stools are dark.  Stool Hemoccults x 3 in July were negative.  She states she has been compliant with the iron and tolerating this well, except for constipation.  Taking meloxicam 7.5 mg daily.  Denies other NSAID use.   EGD colonoscopy in [place] in 2021 were negative, but I do not have the report. She had not been able to follow-up with GI on her own, but was been referred back to GI by Ms. Davis.   -.  MCV 94.  Ferritin 47.  Platelets 338,000.  WBC 6.8, 77% neutrophils, 15% lymphocytes, 5% monocytes, 1% eosinophils and 1% basophils.  B12 460 and folate 5.6.  Copper 155.  Zinc 71.  Retake 31.5.  Coombs negative.  Potassium 3.0, CMP otherwise unremarkable.  February 15, 2023: Scheduled follow-up for anemia   Social history: Current cigarette smoker having smoked three quarters of a pack of cigarettes 34 years.  She states she is trying to quit, but could not use Chantix or nicotine patch.  She rarely drinks alcohol and denies previous heavy use.  She denies other substance use.  She is married with 2 children.  She is disabled since lumbar fusion caused nerve damage to the left leg.   Family history: Her mother had colon cancer at age 60.  Her maternal grandfather had stomach cancer over age 2.   REVIEW OF SYSTEMS:  Review of Systems - Oncology    STUDIES:  ECHOCARDIOGRAM  COMPLETE  Result Date: 02/08/2023    ECHOCARDIOGRAM REPORT   Patient Name:   Kaitlyn Diaz Date of Exam: 02/08/2023 Medical Rec #:  [id number]       Height:       61.0 in Accession #:    [id number]      Weight:       170.0 lb Date of Birth:  January 09, 1966       BSA:          1.763 m Patient Age:    57 years        BP:           103/67 mmHg Patient Gender: F               HR:           74 bpm. Exam Location:  [facility] Procedure: 2D Echo, Cardiac Doppler, Color Doppler and Strain Analysis Indications:    Syncope and collapse [Q65 (ICD-10-CM)]; Chest pain of uncertain                  etiology [R07.9 (ICD-10-CM)]  History:        Patient has no prior history of Echocardiogram examinations.                 CAD, Signs/Symptoms:Syncope and Chest Pain; Risk Factors:Current                 Smoker.  Sonographer:    Margreta Journey RDCS Referring Phys: [id number] Marlyn Corporal MADIREDDY IMPRESSIONS  1. Left ventricular ejection fraction, by estimation, is 60 to 65%. The left ventricle has normal function. The left ventricle has no regional wall motion abnormalities. Left ventricular diastolic parameters are consistent with Grade I diastolic dysfunction (impaired relaxation). The average left ventricular global longitudinal strain is 20.3 %. The global longitudinal strain is normal.  2. Right ventricular systolic function is normal. The right ventricular size is normal.  3. Left atrial size was mildly dilated.  4. Right atrial size was mildly dilated.  5. The mitral valve is normal in structure. Trivial mitral valve regurgitation. No evidence of mitral stenosis.  6. The aortic valve was not well visualized. Aortic valve regurgitation is mild. No aortic stenosis is present.  7. The inferior vena cava is normal in size with greater than 50% respiratory variability, suggesting right atrial pressure of 3 mmHg. FINDINGS  Left Ventricle: Left ventricular ejection fraction, by estimation, is 60 to 65%. The left ventricle has normal function. The left ventricle has no regional wall motion abnormalities. The average left ventricular global longitudinal strain is 20.3 %. The  global longitudinal strain is normal. The left ventricular internal cavity size was normal in size. There is no left ventricular hypertrophy. Left ventricular diastolic parameters are consistent with Grade I diastolic dysfunction (impaired relaxation). Normal left ventricular filling pressure. Right Ventricle: The right ventricular size is normal. No increase in right ventricular wall thickness. Right ventricular systolic function is normal. Left  Atrium: Left atrial size was mildly dilated. Right Atrium: Right atrial size was mildly dilated. Pericardium: Trivial pericardial effusion is present. The pericardial effusion is posterior to the left ventricle. Mitral Valve: The mitral valve is normal in structure. Trivial mitral valve regurgitation. No evidence of mitral valve stenosis. Tricuspid Valve: The tricuspid valve is normal in structure. Tricuspid valve regurgitation is not demonstrated. No evidence of tricuspid stenosis. Aortic Valve: The aortic valve was not well visualized. Aortic valve regurgitation is mild. Aortic regurgitation PHT measures 508 msec.  No aortic stenosis is present. Pulmonic Valve: The pulmonic valve was normal in structure. Pulmonic valve regurgitation is not visualized. No evidence of pulmonic stenosis. Aorta: The aortic root, ascending aorta, aortic arch and descending aorta are all structurally normal, with no evidence of dilitation or obstruction. Venous: The pulmonary veins were not well visualized. The inferior vena cava is normal in size with greater than 50% respiratory variability, suggesting right atrial pressure of 3 mmHg. IAS/Shunts: No atrial level shunt detected by color flow Doppler.  LEFT VENTRICLE PLAX 2D LVIDd:         4.30 cm   Diastology LVIDs:         3.10 cm   LV e' medial:    10.00 cm/s LV PW:         1.20 cm   LV E/e' medial:  9.1 LV IVS:        1.10 cm   LV e' lateral:   9.14 cm/s LVOT diam:     1.80 cm   LV E/e' lateral: 9.9 LV SV:         78 LV SV Index:   44        2D Longitudinal Strain LVOT Area:     2.54 cm  2D Strain GLS Avg:     20.3 %  RIGHT VENTRICLE             IVC RV Basal diam:  3.90 cm     IVC diam: 1.10 cm RV Mid diam:    3.30 cm RV S prime:     12.70 cm/s TAPSE (M-mode): 2.7 cm LEFT ATRIUM             Index        RIGHT ATRIUM           Index LA diam:        3.50 cm 1.99 cm/m   RA Area:     23.00 cm LA Vol (A2C):   67.4 ml 38.24 ml/m  RA Volume:   69.00 ml  39.14 ml/m LA Vol (A4C):   51.9  ml 29.44 ml/m LA Biplane Vol: 60.2 ml 34.15 ml/m  AORTIC VALVE LVOT Vmax:   133.50 cm/s LVOT Vmean:  86.900 cm/s LVOT VTI:    0.306 m AI PHT:      508 msec  AORTA Ao Root diam: 3.40 cm Ao Asc diam:  2.80 cm Ao Desc diam: 2.00 cm MITRAL VALVE MV Area (PHT): 2.68 cm     SHUNTS MV Decel Time: 284 msec     Systemic VTI:  0.31 m MR Peak grad: 97.6 mmHg     Systemic Diam: 1.80 cm MR Mean grad: 68.0 mmHg MR Vmax:      494.00 cm/s MR Vmean:     395.0 cm/s MV E velocity: 90.85 cm/s MV A velocity: 112.00 cm/s MV E/A ratio:  0.81 Norman Herrlich MD Electronically signed by Norman Herrlich MD Signature Date/Time: 02/08/2023/6:26:12 PM    Final    CT CORONARY MORPH W/CTA COR W/SCORE Vallarie Mare W/CM &/OR WO/CM  Addendum Date: 02/07/2023   ADDENDUM REPORT: 02/07/2023 10:58 EXAM: OVER-READ INTERPRETATION  CT CHEST The following report is an over-read performed by radiologist Dr. Curly Shores [facility] on 02/07/2023. This over-read does not include interpretation of cardiac or coronary anatomy or pathology. The coronary CTA interpretation by the cardiologist is attached. COMPARISON:  06/16/2021. FINDINGS: Cardiovascular: Cardiomegaly. See findings discussed in the body of the report. Mediastinum/Nodes: No suspicious adenopathy identified. Imaged  mediastinal structures are unremarkable. Lungs/Pleura: There is dependent basilar subsegmental atelectasis. No pneumonia or pulmonary edema. No pleural effusion or pneumothorax. Upper Abdomen: Small hiatal hernia. Musculoskeletal: No chest wall abnormality. No acute osseous findings. IMPRESSION: Small hiatal hernia. Bibasilar scarring or subsegmental atelectasis. Electronically Signed   By: Layla Maw M.D.   On: 02/07/2023 10:58   Result Date: 02/07/2023 CLINICAL DATA:  Chest pain EXAM: Cardiac/Coronary CTA TECHNIQUE: A non-contrast, gated CT scan was obtained with axial slices of 3 mm through the heart for calcium scoring. Calcium scoring was performed using the  Agatston method. A 120 kV prospective, gated, contrast cardiac scan was obtained. Gantry rotation speed was 250 msecs and collimation was 0.6 mm. Two sublingual nitroglycerin tablets (0.8 mg) were given. The 3D data set was reconstructed in 5% intervals of the 35-75% of the R-R cycle. Diastolic phases were analyzed on a dedicated workstation using MPR, MIP, and VRT modes. The patient received 95 cc of contrast. FINDINGS: Image quality: Poor. Respiratory artifact, slab artifact. Blooming artifact. Noise artifact is: Mild. Coronary Arteries:  Normal coronary origin.  Right dominance. Left main: The left main is a large caliber vessel with a normal take off from the left coronary cusp that bifurcates to form a left anterior descending artery and a left circumflex artery. There is no plaque or stenosis. Left anterior descending artery: The LAD gives off 2 patent diagonal branches. There is mild to moderate calcified plaque in the proximal LAD with associated stenosis of 25-49%. There is severe mixed plaque in the proximal to mid LAD with associated stenosis of 70-99%. The images are poor quality and this may be overestimated. Left circumflex artery: The LCX is non-dominant and patent with no evidence of plaque or stenosis. The LCX gives off 2 patent obtuse marginal branches. Right coronary artery: The RCA is dominant with normal take off from the right coronary cusp. There is no evidence of plaque or stenosis. The RCA terminates as a PDA and right posterolateral branch without evidence of plaque or stenosis. Right Atrium: Right atrial size is within normal limits. Right Ventricle: The right ventricular cavity is within normal limits. Left Atrium: Left atrial size is normal in size. Left atrial appendage not visualized. Left Ventricle: The ventricular cavity size is within normal limits. Pulmonary arteries: Normal in size. Pulmonary veins: Normal pulmonary venous drainage. Pericardium: Normal thickness without  significant effusion or calcium present. Cardiac valves: The aortic valve is trileaflet without significant calcification. The mitral valve is normal without significant calcification. Aorta: Cannot comment on ascending aorta due to being out of field of view. Extra-cardiac findings: See attached radiology report for non-cardiac structures. IMPRESSION: 1. Coronary calcium score of 439. This was 98th percentile for age-, sex, and race-matched controls. 2. Normal coronary origin with right dominance. 3. Severe atherosclerosis. Possible 70-99% proximal to mid LAD. The images are poor quality and this may be overestimated due to significant blooming, respiratory and slab artifact. The RCA and LCx are normal. 4. Plaque analysis and FFR could not be performed due to severe respiratory artifact. 5. Recommend cardiac catheterization. 6. Consider symptom-guided anti-ischemic and preventive pharmacotherapy as well as risk factor modification per guideline-directed care. RECOMMENDATIONS: 1. CAD-RADS 0: No evidence of CAD (0%). Consider non-atherosclerotic causes of chest pain. 2. CAD-RADS 1: Minimal non-obstructive CAD (0-24%). Consider non-atherosclerotic causes of chest pain. Consider preventive therapy and risk factor modification. 3. CAD-RADS 2: Mild non-obstructive CAD (25-49%). Consider non-atherosclerotic causes of chest pain. Consider preventive therapy and risk factor modification. 4.  CAD-RADS 3: Moderate stenosis. Consider symptom-guided anti-ischemic pharmacotherapy as well as risk factor modification per guideline directed care. Additional analysis with CT FFR will be submitted. 5. CAD-RADS 4: Severe stenosis. (70-99% or > 50% left main). Cardiac catheterization or CT FFR is recommended. Consider symptom-guided anti-ischemic pharmacotherapy as well as risk factor modification per guideline directed care. Invasive coronary angiography recommended with revascularization per published guideline statements. 6. CAD-RADS  5: Total coronary occlusion (100%). Consider cardiac catheterization or viability assessment. Consider symptom-guided anti-ischemic pharmacotherapy as well as risk factor modification per guideline directed care. 7. CAD-RADS N: Non-diagnostic study. Obstructive CAD can't be excluded. Alternative evaluation is recommended. Armanda Magic, MD Electronically Signed: By: Armanda Magic M.D. On: 01/15/2023 15:29   CARDIAC CATHETERIZATION  Result Date: 02/01/2023   Prox LAD to Mid LAD lesion is 25% stenosed.   The left ventricular systolic function is normal.   LV end diastolic pressure is normal.   The left ventricular ejection fraction is 55-65% by visual estimate. Mild nonobstructive CAD Normal LV function Normal LVEDP Plan: risk factor modification.      HISTORY:   Past Medical History:  Diagnosis Date   Anemia    Anxiety    "panic attacks, claustophobic on xanax"   Arthritis    Asthma    Complication of anesthesia    Difficult to put to sleep, Has run fever, "runs in famly"   COPD (chronic obstructive pulmonary disease) (HCC)    Enlarged heart    hx of   Essential (primary) hypertension    Family history of anesthesia complication    malignant hyperthermia on mother's side of family   Generalized anxiety disorder    GERD (gastroesophageal reflux disease)    H/O hiatal hernia    hx of   Headache(784.0)    "due to Blood pressure"   History of melanoma    Hypertension    Dr. [place], medical physician [phone]   Malignant hyperthermia    Mixed hyperlipidemia    Neuromuscular disorder (HCC)    "85 % nerve damage in left leg"   Pneumonia    hx of pneumonia x 2   PONV (postoperative nausea and vomiting)    Spinal headache    Urinary tract infection    and kidney infection Hx of    Past Surgical History:  Procedure Laterality Date   ANTERIOR CERVICAL DECOMP/DISCECTOMY FUSION  12/28/2011   Procedure: ANTERIOR CERVICAL DECOMPRESSION/DISCECTOMY FUSION 2 LEVELS;  Surgeon:  Mariam Dollar, MD;  Location: [facility];  Service: Neurosurgery;  Laterality: N/A;  HISTORY OF MALIGNANT HYPERTHERMIA Anterior Cervical Discectomy Fusion of Cervical five-six, Cervical six-seven   BLADDER SUSPENSION     BREAST SURGERY     left breast mass removed   hand mass     1994, multiple masses removed - "all benign"   LEFT HEART CATH AND CORONARY ANGIOGRAPHY N/A 02/01/2023   Procedure: LEFT HEART CATH AND CORONARY ANGIOGRAPHY;  Surgeon: [place]az[place], Peter M, MD;  Location: [facility] INVASIVE CV LAB;  Service: Cardiovascular;  Laterality: N/A;   leg mass     left leg  surgically removed   POSTERIOR FUSION LUMBAR SPINE     L4/5   TONSILLECTOMY     and adnoids   TUBAL LIGATION      The patient has a family history of  Social History:  reports that she has been smoking cigarettes. She started smoking about 44 years ago. She has a 69.5 pack-year smoking history. She has never used  smokeless tobacco. She reports that she does not currently use alcohol. She reports that she does not use drugs.  Allergies: Allergies  Allergen Reactions   Amoxicillin Anaphylaxis, Itching and Swelling   Penicillins Itching and Swelling    Throat swelling   Codeine Other (See Comments)    nausea   Oxycodone Other (See Comments)    Hallucinations   Oxycodone Hcl Other (See Comments)    hallucinations   Oxycodone Hcl     Current Medications: Current Outpatient Medications  Medication Sig Dispense Refill   albuterol (VENTOLIN HFA) 108 (90 Base) MCG/ACT inhaler Inhale 1 puff into the lungs 3 (three) times daily as needed. For shortness of breath (Patient taking differently: Inhale 1 puff into the lungs 3 (three) times daily as needed for wheezing or shortness of breath. For shortness of breath) 1 each 3   ALPRAZolam (XANAX) 1 MG tablet TAKE 1 TABLET BY MOUTH 3 TIMES DAILY AS NEEDED FOR ANXIETY 90 tablet 0   amLODipine (NORVASC) 10 MG tablet TAKE 1 TABLET BY MOUTH EVERY DAY (Patient taking differently: Take 10  mg by mouth daily.) 90 tablet 0   ammonium lactate (AMLACTIN) 12 % cream Apply 1 Application topically as needed for dry skin.     busPIRone (BUSPAR) 15 MG tablet TAKE 1 TABLET BY MOUTH 2 TIMES A DAY (Patient taking differently: Take 15 mg by mouth 2 (two) times daily.) 180 tablet 0   clobetasol cream (TEMOVATE) 0.05 % Apply 1 Application topically 2 (two) times daily.     DULoxetine (CYMBALTA) 60 MG capsule TAKE 1 CAPSULE BY MOUTH 2 TIMES DAILY (Patient taking differently: Take 60 mg by mouth 2 (two) times daily.) 180 capsule 1   etodolac (LODINE) 400 MG tablet Take 400 mg by mouth 2 (two) times daily.     fenofibrate micronized (LOFIBRA) 134 MG capsule TAKE 1 CAPSULE BY MOUTH ONCE DAILY (Patient taking differently: Take 134 mg by mouth daily before breakfast. TAKE 1 CAPSULE BY MOUTH ONCE DAILY) 90 capsule 1   ferrous sulfate (FEROSUL) 325 (65 FE) MG tablet Take 325 mg by mouth daily with breakfast.     fluticasone furoate-vilanterol (BREO ELLIPTA) 200-25 MCG/ACT AEPB INHALE 1 PUFF INTO LUNGS ONCE DAILY AS DIRECTED (RINSE, GARGLE & SPIT AFTER USE) (Patient taking differently: Inhale 1 puff into the lungs daily.) 60 each 2   furosemide (LASIX) 20 MG tablet TAKE 1 TABLET BY MOUTH EACH DAY (Patient not taking: Reported on 01/29/2023) 90 tablet 0   lisinopril (ZESTRIL) 20 MG tablet TAKE ONE TABLET BY MOUTH EACH DAY FOR BLOOD PRESSURE (Patient taking differently: Take 20 mg by mouth daily.) 90 tablet 0   meloxicam (MOBIC) 7.5 MG tablet TAKE 1 TABLET BY MOUTH EVERY DAY 90 tablet 1   methocarbamol (ROBAXIN) 750 MG tablet Take 750 mg by mouth 3 (three) times daily.     omeprazole (PRILOSEC) 40 MG capsule TAKE 1 CAPSULE BY MOUTH DAILY BEFORE A MEAL (Patient taking differently: Take 40 mg by mouth daily. TAKE 1 CAPSULE BY MOUTH DAILY BEFORE A MEAL) 90 capsule 1   potassium chloride (KLOR-CON) 10 MEQ tablet TAKE 1 TABLET BY MOUTH EVERY DAY 30 tablet 0   pregabalin (LYRICA) 200 MG capsule Take 200 mg by mouth 3  (three) times daily.     rosuvastatin (CRESTOR) 20 MG tablet Take 1 tablet (20 mg total) by mouth daily. 90 tablet 3   traMADol (ULTRAM) 50 MG tablet Take 1 tablet (50 mg total) by mouth 2 (two)  times daily. 60 tablet 1   Vitamin D, Ergocalciferol, (DRISDOL) 1.25 MG (50000 UNIT) CAPS capsule Take 1 capsule (50,000 Units total) by mouth every 7 (seven) days. 5 capsule 5   No current facility-administered medications for this visit.     PHYSICAL EXAM:  Last menstrual period 06/03/2011. Wt Readings from Last 3 Encounters:  02/01/23 170 lb (77.1 kg)  01/25/23 176 lb (79.8 kg)  01/07/23 171 lb 6.4 oz (77.7 kg)   There is no height or weight on file to calculate BMI.  Performance status (ECOG): {CHL ONC [id number]  Physical Exam   LABS:      Latest Ref Rng & Units 01/25/2023    3:41 PM 12/27/2022    2:36 PM 11/14/2022    9:56 AM  CBC  WBC 3.4 - 10.8 x10E3/uL 6.7  7.0  6.6   Hemoglobin 11.1 - 15.9 g/dL 40.9  81.1  91.4   Hematocrit 34.0 - 46.6 % 36.0  35.4  34.5   Platelets 150 - 450 x10E3/uL 324  407  338       Latest Ref Rng & Units 01/30/2023   10:17 AM 01/25/2023    3:41 PM 01/07/2023    2:14 PM  CMP  Glucose 70 - 99 mg/dL 81  95  79   BUN 6 - 24 mg/dL 16  16  17    Creatinine 0.57 - 1.00 mg/dL 7.82  9.56  2.13   Sodium 134 - 144 mmol/L 137  138  139   Potassium 3.5 - 5.2 mmol/L 5.1  6.1  4.9   Chloride 96 - 106 mmol/L 100  101  100   CO2 20 - 29 mmol/L 25  21  22    Calcium 8.7 - 10.2 mg/dL 9.8  9.8  9.9      No results found for: "CEA1", "CEA" / No results found for: "CEA1", "CEA" No results found for: "PSA1" No results found for: "YQM578" No results found for: "CAN125"  No results found for: "TOTALPROTELP", "ALBUMINELP", "A1GS", "A2GS", "BETS", "BETA2SER", "GAMS", "MSPIKE", "SPEI" Lab Results  Component Value Date   TIBC 460 (H) 12/27/2022   TIBC 596 (HH) 11/06/2022   TIBC 612 (HH) 09/25/2022   FERRITIN 71 12/27/2022   FERRITIN 47 11/14/2022   FERRITIN  81 11/06/2022   IRONPCTSAT 15 12/27/2022   IRONPCTSAT 14 (L) 11/06/2022   IRONPCTSAT 8 (LL) 09/25/2022   No results found for: "LDH"  No results found for: "AFPTUMOR", "TOTALPROTELP", "ALBUMINELP", "A1GS", "A2GS", "BETS", "BETA2SER", "GAMS", "MSPIKE", "SPEI", "LDH", "CEA1", "CEA", "PSA1", "IGASERUM", "IGGSERUM", "IGMSERUM", "THGAB", "THYROGLB"  Review Flowsheet  More data exists      Latest Ref Rng & Units 11/06/2022 11/14/2022 12/27/2022  Oncology Labs  Ferritin 15 - 150 ng/mL 81  47  71   %SAT 15 - 55 % 14  - 15     Details             ASSESSMENT & PLAN:  Assessment/Plan:  57 y.o. female with anemia  Iron deficiency anemia  -Most likely due to chronic GI blood loss from use of NSAID  -GI evaluation pending -Responding to oral iron supplementation -No evidence of other nutritional or trace mineral deficiency    Tobacco abuse -Follow-up with Ms. Davis regarding CT chest for lung cancer screening.  The patient understands the importance of complete abstinence from tobacco    The patient understands all the plans discussed today and is in agreement with them.  She knows to contact our office  if she develops concerns prior to her next appointment  ***  minutes was spent in patient care.  This included time spent preparing to see the patient (e.g., review of tests), obtaining and/or reviewing separately obtained history, counseling and educating the patient/family/caregiver, ordering medications, tests, or procedures; documenting clinical information in the electronic or other health record, independently interpreting results and communicating results to the patient/family/caregiver as well as coordination of care.         Adah Perl, PA-C

## 2023-02-12 NOTE — Telephone Encounter (Signed)
Pt is requesting a callback regarding her ECHO results. Please advise.

## 2023-02-13 NOTE — Telephone Encounter (Signed)
LVM regarding Echo results

## 2023-02-14 NOTE — Telephone Encounter (Signed)
 Results reviewed with pt as per Dr. Madireddy's note.  Pt verbalized understanding and had no additional questions. Routed to PCP

## 2023-02-15 ENCOUNTER — Inpatient Hospital Stay: Payer: Medicare HMO

## 2023-02-15 ENCOUNTER — Other Ambulatory Visit: Payer: Self-pay | Admitting: Hematology and Oncology

## 2023-02-15 ENCOUNTER — Inpatient Hospital Stay: Payer: Medicare HMO | Attending: Hematology and Oncology | Admitting: Hematology and Oncology

## 2023-02-15 DIAGNOSIS — D649 Anemia, unspecified: Secondary | ICD-10-CM

## 2023-02-18 ENCOUNTER — Telehealth: Payer: Self-pay | Admitting: Hematology and Oncology

## 2023-02-18 ENCOUNTER — Telehealth: Payer: Self-pay

## 2023-02-18 NOTE — Telephone Encounter (Signed)
Left vm to return call.   Dear Kaitlyn Diaz,   CT scan results over read by the radiologist for findings outside of the heart is now available.  It shows small hiatal hernia, which basically is protrusion of upper portion of gastric contents into the chest cavity through the muscular wall that separates the chest and the abdomen.  This can result in symptoms like heartburn or reflux.  No other major abnormalities.   Please do not hesitate to contact my office with any questions.  Thank you

## 2023-02-18 NOTE — Telephone Encounter (Signed)
02/18/23 LVM to reschedule appt.

## 2023-02-18 NOTE — Telephone Encounter (Signed)
-----   Message from Marlyn Corporal Madireddy sent at 02/17/2023 10:49 AM EST ----- Please inform her of the results from radiologist over read as per my comments sent to her MyChart. She does not seem to access the MyChart portal.  Thank you

## 2023-02-22 ENCOUNTER — Other Ambulatory Visit: Payer: Self-pay | Admitting: Physician Assistant

## 2023-02-22 ENCOUNTER — Other Ambulatory Visit: Payer: Self-pay | Admitting: Family Medicine

## 2023-02-22 DIAGNOSIS — F411 Generalized anxiety disorder: Secondary | ICD-10-CM

## 2023-02-22 DIAGNOSIS — M51379 Other intervertebral disc degeneration, lumbosacral region without mention of lumbar back pain or lower extremity pain: Secondary | ICD-10-CM

## 2023-02-22 DIAGNOSIS — G8929 Other chronic pain: Secondary | ICD-10-CM

## 2023-02-25 ENCOUNTER — Ambulatory Visit (INDEPENDENT_AMBULATORY_CARE_PROVIDER_SITE_OTHER): Payer: Medicare HMO | Admitting: Physician Assistant

## 2023-02-25 ENCOUNTER — Encounter: Payer: Self-pay | Admitting: Physician Assistant

## 2023-02-25 VITALS — BP 124/82 | HR 78 | Temp 97.1°F | Ht 61.0 in | Wt 167.0 lb

## 2023-02-25 DIAGNOSIS — E559 Vitamin D deficiency, unspecified: Secondary | ICD-10-CM | POA: Diagnosis not present

## 2023-02-25 DIAGNOSIS — Z23 Encounter for immunization: Secondary | ICD-10-CM

## 2023-02-25 DIAGNOSIS — I10 Essential (primary) hypertension: Secondary | ICD-10-CM

## 2023-02-25 DIAGNOSIS — J438 Other emphysema: Secondary | ICD-10-CM

## 2023-02-25 DIAGNOSIS — G8929 Other chronic pain: Secondary | ICD-10-CM | POA: Diagnosis not present

## 2023-02-25 DIAGNOSIS — J449 Chronic obstructive pulmonary disease, unspecified: Secondary | ICD-10-CM | POA: Diagnosis not present

## 2023-02-25 DIAGNOSIS — D508 Other iron deficiency anemias: Secondary | ICD-10-CM

## 2023-02-25 DIAGNOSIS — E782 Mixed hyperlipidemia: Secondary | ICD-10-CM

## 2023-02-25 DIAGNOSIS — F411 Generalized anxiety disorder: Secondary | ICD-10-CM

## 2023-02-25 DIAGNOSIS — K219 Gastro-esophageal reflux disease without esophagitis: Secondary | ICD-10-CM

## 2023-02-25 DIAGNOSIS — M5442 Lumbago with sciatica, left side: Secondary | ICD-10-CM

## 2023-02-25 MED ORDER — FLUTICASONE FUROATE-VILANTEROL 200-25 MCG/ACT IN AEPB: 1.0000 | INHALATION_SPRAY | Freq: Every day | RESPIRATORY_TRACT | 5 refills | Status: DC

## 2023-02-25 MED ORDER — LISINOPRIL 10 MG PO TABS: 10.0000 mg | ORAL_TABLET | Freq: Every day | ORAL | 1 refills | Status: DC

## 2023-02-25 MED ORDER — AMLODIPINE BESYLATE 10 MG PO TABS: 10.0000 mg | ORAL_TABLET | Freq: Every day | ORAL | 1 refills | Status: DC

## 2023-02-25 MED ORDER — ALPRAZOLAM 1 MG PO TABS: 1.0000 mg | ORAL_TABLET | Freq: Three times a day (TID) | ORAL | 0 refills | Status: DC | PRN

## 2023-02-25 MED ORDER — OMEPRAZOLE 40 MG PO CPDR: DELAYED_RELEASE_CAPSULE | ORAL | 1 refills | Status: DC

## 2023-02-25 NOTE — Progress Notes (Deleted)
Subjective:  Patient ID: Kaitlyn Diaz, female    DOB: [birth year]  Age: 57 y.o. MRN: [id number]  Chief Complaint  Patient presents with  . Follow up from cardiology    HPI      02/25/2023   10:49 AM 11/14/2022    9:35 AM 11/06/2022   10:41 AM 06/12/2021   11:40 AM 11/22/2020    1:39 PM  Depression screen PHQ 2/9  Decreased Interest 1 0 2 2 0  Down, Depressed, Hopeless 2 0 2 3 0  PHQ - 2 Score 3 0 4 5 0  Altered sleeping 1  3 3    Tired, decreased energy 3  3 3    Change in appetite 1  0 0   Feeling bad or failure about yourself  1  2 3    Trouble concentrating 2  0 0   Moving slowly or fidgety/restless 3  0 0   Suicidal thoughts 0  0 0   PHQ-9 Score 14  12 14    Difficult doing work/chores Very difficult            03/23/2021    9:32 AM 06/12/2021   11:39 AM 05/02/2022    1:28 PM 11/06/2022   10:40 AM  Fall Risk  Falls in the past year? 0 0 0 0  Was there an injury with Fall? 0 0 0 0  Fall Risk Category Calculator 0 0 0 0  Fall Risk Category (Retired) Low Low    (RETIRED) Patient Fall Risk Level Low fall risk Low fall risk    Patient at Risk for Falls Due to No Fall Risks  No Fall Risks No Fall Risks  Fall risk Follow up   Falls evaluation completed Falls evaluation completed     ROS CONSTITUTIONAL: Negative for chills, fatigue, fever, unintentional weight gain and unintentional weight loss.  E/N/T: Negative for ear pain, nasal congestion and sore throat.  CARDIOVASCULAR: Negative for chest pain, dizziness, palpitations and pedal edema.  RESPIRATORY: Negative for recent cough and dyspnea.  GASTROINTESTINAL: Negative for abdominal pain, acid reflux symptoms, constipation, diarrhea, nausea and vomiting.  MSK: Negative for arthralgias and myalgias.  INTEGUMENTARY: Negative for rash.  NEUROLOGICAL: Negative for dizziness and headaches.  PSYCHIATRIC: Negative for sleep disturbance and to question depression screen.  Negative for depression, negative for anhedonia.     Current Outpatient Medications:  .  albuterol (VENTOLIN HFA) 108 (90 Base) MCG/ACT inhaler, Inhale 1 puff into the lungs 3 (three) times daily as needed. For shortness of breath (Patient taking differently: Inhale 1 puff into the lungs 3 (three) times daily as needed for wheezing or shortness of breath. For shortness of breath), Disp: 1 each, Rfl: 3 .  ALPRAZolam (XANAX) 1 MG tablet, TAKE 1 TABLET BY MOUTH 3 TIMES DAILY AS NEEDED FOR ANXIETY, Disp: 90 tablet, Rfl: 0 .  amLODipine (NORVASC) 10 MG tablet, TAKE 1 TABLET BY MOUTH EVERY DAY (Patient taking differently: Take 10 mg by mouth daily.), Disp: 90 tablet, Rfl: 0 .  ammonium lactate (AMLACTIN) 12 % cream, Apply 1 Application topically as needed for dry skin., Disp: , Rfl:  .  aspirin EC 81 MG tablet, Take 81 mg by mouth daily. Swallow whole., Disp: , Rfl:  .  busPIRone (BUSPAR) 15 MG tablet, TAKE 1 TABLET BY MOUTH 2 TIMES A DAY (Patient taking differently: Take 15 mg by mouth daily.), Disp: 180 tablet, Rfl: 0 .  clobetasol cream (TEMOVATE) 0.05 %, Apply 1 Application topically 2 (two) times daily.,  Disp: , Rfl:  .  DULoxetine (CYMBALTA) 60 MG capsule, TAKE 1 CAPSULE BY MOUTH 2 TIMES DAILY (Patient taking differently: Take 60 mg by mouth 2 (two) times daily.), Disp: 180 capsule, Rfl: 1 .  etodolac (LODINE) 400 MG tablet, Take 400 mg by mouth 2 (two) times daily., Disp: , Rfl:  .  fenofibrate micronized (LOFIBRA) 134 MG capsule, TAKE 1 CAPSULE BY MOUTH ONCE DAILY (Patient taking differently: Take 134 mg by mouth daily before breakfast. TAKE 1 CAPSULE BY MOUTH ONCE DAILY), Disp: 90 capsule, Rfl: 1 .  ferrous sulfate (FEROSUL) 325 (65 FE) MG tablet, Take 325 mg by mouth daily with breakfast., Disp: , Rfl:  .  fluticasone furoate-vilanterol (BREO ELLIPTA) 200-25 MCG/ACT AEPB, INHALE 1 PUFF INTO LUNGS ONCE DAILY AS DIRECTED (RINSE, GARGLE & SPIT AFTER USE) (Patient taking differently: Inhale 1 puff into the lungs daily.), Disp: 60 each, Rfl: 2 .   furosemide (LASIX) 20 MG tablet, TAKE 1 TABLET BY MOUTH EACH DAY, Disp: 90 tablet, Rfl: 0 .  lisinopril (ZESTRIL) 20 MG tablet, TAKE ONE TABLET BY MOUTH EACH DAY FOR BLOOD PRESSURE (Patient taking differently: Take 10 mg by mouth daily.), Disp: 90 tablet, Rfl: 0 .  meloxicam (MOBIC) 7.5 MG tablet, TAKE 1 TABLET BY MOUTH EVERY DAY, Disp: 90 tablet, Rfl: 1 .  methocarbamol (ROBAXIN) 750 MG tablet, Take 750 mg by mouth 3 (three) times daily., Disp: , Rfl:  .  omeprazole (PRILOSEC) 40 MG capsule, TAKE 1 CAPSULE BY MOUTH DAILY BEFORE A MEAL (Patient taking differently: Take 40 mg by mouth daily. TAKE 1 CAPSULE BY MOUTH DAILY BEFORE A MEAL), Disp: 90 capsule, Rfl: 1 .  pregabalin (LYRICA) 200 MG capsule, Take 200 mg by mouth 3 (three) times daily., Disp: , Rfl:  .  rosuvastatin (CRESTOR) 20 MG tablet, Take 1 tablet (20 mg total) by mouth daily., Disp: 90 tablet, Rfl: 3 .  traMADol (ULTRAM) 50 MG tablet, Take 1 tablet (50 mg total) by mouth 2 (two) times daily as needed. (Patient not taking: Reported on 02/25/2023), Disp: 60 tablet, Rfl: 0  Past Medical History:  Diagnosis Date  . Anemia   . Anxiety    "panic attacks, claustophobic on xanax"  . Arthritis   . Asthma   . Complication of anesthesia    Difficult to put to sleep, Has run fever, "runs in famly"  . COPD (chronic obstructive pulmonary disease) (HCC)   . Enlarged heart    hx of  . Essential (primary) hypertension   . Family history of anesthesia complication    malignant hyperthermia on mother's side of family  . Generalized anxiety disorder   . GERD (gastroesophageal reflux disease)   . H/O hiatal hernia    hx of  . Headache(784.0)    "due to Blood pressure"  . History of melanoma   . Hypertension    Dr. Syble Creek, medical physician [phone]  . Malignant hyperthermia   . Mixed hyperlipidemia   . Neuromuscular disorder (HCC)    "85 % nerve damage in left leg"  . Pneumonia    hx of pneumonia x 2  . PONV (postoperative  nausea and vomiting)   . Spinal headache   . Urinary tract infection    and kidney infection Hx of   Objective:  PHYSICAL EXAM:   BP (!) 142/72 (BP Location: Left Arm, Patient Position: Sitting)   Pulse 78   Temp (!) 97.1 F (36.2 C) (Temporal)   Ht 5\' 1"  (1.549 m)  Wt 167 lb (75.8 kg)   LMP 06/03/2011   SpO2 100%   BMI 31.55 kg/m  {Vitals History (Optional):23777}  GEN: Well nourished, well developed, in no acute distress  HEENT: normal external ears and nose - normal external auditory canals and TMS - hearing grossly normal - normal nasal mucosa and septum - Lips, Teeth and Gums - normal  Oropharynx - normal mucosa, palate, and posterior pharynx Neck: no JVD or masses - no thyromegaly Cardiac: RRR; no murmurs, rubs, or gallops,no edema - no significant varicosities Respiratory:  normal respiratory rate and pattern with no distress - normal breath sounds with no rales, rhonchi, wheezes or rubs GI: normal bowel sounds, no masses or tenderness MS: no deformity or atrophy  Skin: warm and dry, no rash  Neuro:  Alert and Oriented x 3, Strength and sensation are intact - CN II-Xii grossly intact Psych: euthymic mood, appropriate affect and demeanor  Assessment & Plan:  *** Needs flu shot -     Influenza, MDCK, trivalent, PF(Flucelvax egg-free)     Follow-up: No follow-ups on file.  An After Visit Summary was printed and given to the patient.  Jettie Pagan [facility] [phone]

## 2023-02-25 NOTE — Progress Notes (Signed)
Established Patient Office Visit  Subjective:  Patient ID: Kaitlyn Diaz, female    DOB: Sep 23, 1965  Age: 57 y.o. MRN: [id number]  CC:  Chief Complaint  Patient presents with   Chronic back pain Chronic follow up           HPI [name] presents for hypertension     Pt presents for follow up of hypertension.  She is tolerating the medication well without side effects.  Compliance with treatment has been good; she takes her medication as directed, maintains her diet and exercise regimen, and follows up as directed.  She is taking lisinopril 10mg  qd and norvasc 10mg  qd She has recently had a thorough cardiology workup including cath and has been found to have nonobstructive disease - did not have to get any stents She has a follow up next month with them States overall feeling well    Follow up of generalized anxiety disorder.  pt is doing well on her xanax, cymbalta , buspar and wellbutrin - currently doing well on medication and voices no concerns or problems- requests refill of xanax  Pt presents with hyperlipidemia.  Compliance with treatment has been good; she maintains her low cholesterol diet, follows up as directed, and maintains her exercise regimen.  She denies experiencing any hypercholesterolemia related symptoms.  She is currently taking fenofibrate and crestor 20mg  qd  Pt with GERD - taking omeprazole 40mg  qd   Pt with history of COPD - she is still smoking - she tried chantix but was not able to tolerate medication She uses albuterol prn and breo every day States she is not having any breathing issues at this time Defers lung CT  Pt with iron def anemia and is taking daily iron - due for labwork She has been advised to get GI workup and she states she will call to set up appt and get colonoscopy  Pt currently has appt with neurosurgeon in December for evaluation of her low back pain with sciatica.  At this time she is taking robaxin, lyrica and lodine  Pt  would like flu shot today  Pt with history of Vit D def - currently on weekly supplement    Past Medical History:  Diagnosis Date   Anemia    Anxiety    "panic attacks, claustophobic on xanax"   Arthritis    Asthma    Complication of anesthesia    Difficult to put to sleep, Has run fever, "runs in famly"   COPD (chronic obstructive pulmonary disease) (HCC)    Enlarged heart    hx of   Essential (primary) hypertension    Family history of anesthesia complication    malignant hyperthermia on mother's side of family   Generalized anxiety disorder    GERD (gastroesophageal reflux disease)    H/O hiatal hernia    hx of   Headache(784.0)    "due to Blood pressure"   History of melanoma    Hypertension    Dr. Syble Creek, medical physician [phone]   Malignant hyperthermia    Mixed hyperlipidemia    Neuromuscular disorder (HCC)    "85 % nerve damage in left leg"   Pneumonia    hx of pneumonia x 2   PONV (postoperative nausea and vomiting)    Spinal headache    Urinary tract infection    and kidney infection Hx of    Past Surgical History:  Procedure Laterality Date   ANTERIOR CERVICAL DECOMP/DISCECTOMY FUSION  12/28/2011  Procedure: ANTERIOR CERVICAL DECOMPRESSION/DISCECTOMY FUSION 2 LEVELS;  Surgeon: Mariam Dollar, MD;  Location: [facility];  Service: Neurosurgery;  Laterality: N/A;  HISTORY OF MALIGNANT HYPERTHERMIA Anterior Cervical Discectomy Fusion of Cervical five-six, Cervical six-seven   BLADDER SUSPENSION     BREAST SURGERY     left breast mass removed   hand mass     1994, multiple masses removed - "all benign"   LEFT HEART CATH AND CORONARY ANGIOGRAPHY N/A 02/01/2023   Procedure: LEFT HEART CATH AND CORONARY ANGIOGRAPHY;  Surgeon: [place], Peter M, MD;  Location: [facility] INVASIVE CV LAB;  Service: Cardiovascular;  Laterality: N/A;   leg mass     left leg  surgically removed   POSTERIOR FUSION LUMBAR SPINE     L4/5   TONSILLECTOMY     and adnoids   TUBAL  LIGATION      Family History  Problem Relation Age of Onset   Colon cancer Mother    Stroke Maternal Grandmother    Stomach cancer Maternal Grandfather    Breast cancer Neg Hx     Social History   Socioeconomic History   Marital status: Married    Spouse name: Not on file   Number of children: 2   Years of education: Not on file   Highest education level: GED or equivalent  Occupational History   Occupation: Disabled  Tobacco Use   Smoking status: Every Day    Average packs/day: 0.9 packs/day for 78.0 years (69.5 ttl pk-yrs)    Types: Cigarettes    Start date: 1980   Smokeless tobacco: Never  Vaping Use   Vaping status: Some Days   Substances: Nicotine, Flavoring  Substance and Sexual Activity   Alcohol use: Not Currently    Comment: Drinks occasionally, and consumes wine.   Drug use: No   Sexual activity: Not Currently  Other Topics Concern   Not on file  Social History Narrative   Not on file   Social Determinants of Health   Financial Resource Strain: Low Risk  (11/06/2022)   Overall Financial Resource Strain (CARDIA)    Difficulty of Paying Living Expenses: Not hard at all  Food Insecurity: No Food Insecurity (11/14/2022)   Hunger Vital Sign    Worried About Running Out of Food in the Last Year: Never true    Ran Out of Food in the Last Year: Never true  Transportation Needs: No Transportation Needs (11/14/2022)   PRAPARE - Administrator, Civil Service (Medical): No    Lack of Transportation (Non-Medical): No  Physical Activity: Inactive (11/06/2022)   Exercise Vital Sign    Days of Exercise per Week: 0 days    Minutes of Exercise per Session: 0 min  Stress: No Stress Concern Present (11/06/2022)   [employer] of Occupational Health - Occupational Stress Questionnaire    Feeling of Stress : Not at all  Social Connections: Unknown (01/04/2023)   [facility] from [facility]   Social Network    Social Network: Not on file  Recent Concern:  Social Connections - Moderately Isolated (11/06/2022)   Social Connection and Isolation Panel [NHANES]    Frequency of Communication with Friends and Family: More than three times a week    Frequency of Social Gatherings with Friends and Family: More than three times a week    Attends Religious Services: Never    Database administrator or Organizations: No    Attends Banker Meetings: Never  Marital Status: Married  Catering manager Violence: Not At Risk (01/04/2023)   Received from [facility]   HITS    Over the last 12 months how often did your partner physically hurt you?: Never    Over the last 12 months how often did your partner insult you or talk down to you?: Never    Over the last 12 months how often did your partner threaten you with physical harm?: Never    Over the last 12 months how often did your partner scream or curse at you?: Never     Current Outpatient Medications:    albuterol (VENTOLIN HFA) 108 (90 Base) MCG/ACT inhaler, Inhale 1 puff into the lungs 3 (three) times daily as needed. For shortness of breath (Patient taking differently: Inhale 1 puff into the lungs 3 (three) times daily as needed for wheezing or shortness of breath. For shortness of breath), Disp: 1 each, Rfl: 3   ammonium lactate (AMLACTIN) 12 % cream, Apply 1 Application topically as needed for dry skin., Disp: , Rfl:    aspirin EC 81 MG tablet, Take 81 mg by mouth daily. Swallow whole., Disp: , Rfl:    busPIRone (BUSPAR) 15 MG tablet, TAKE 1 TABLET BY MOUTH 2 TIMES A DAY (Patient taking differently: Take 15 mg by mouth daily.), Disp: 180 tablet, Rfl: 0   DULoxetine (CYMBALTA) 60 MG capsule, TAKE 1 CAPSULE BY MOUTH 2 TIMES DAILY (Patient taking differently: Take 60 mg by mouth 2 (two) times daily.), Disp: 180 capsule, Rfl: 1   etodolac (LODINE) 400 MG tablet, Take 400 mg by mouth 2 (two) times daily., Disp: , Rfl:    fenofibrate micronized (LOFIBRA) 134 MG capsule, TAKE 1 CAPSULE BY MOUTH  ONCE DAILY (Patient taking differently: Take 134 mg by mouth daily before breakfast. TAKE 1 CAPSULE BY MOUTH ONCE DAILY), Disp: 90 capsule, Rfl: 1   ferrous sulfate (FEROSUL) 325 (65 FE) MG tablet, Take 325 mg by mouth daily with breakfast., Disp: , Rfl:    folic acid (FOLVITE) 400 MCG tablet, Take 400 mcg by mouth daily., Disp: , Rfl:    furosemide (LASIX) 20 MG tablet, TAKE 1 TABLET BY MOUTH EACH DAY, Disp: 90 tablet, Rfl: 0   lisinopril (ZESTRIL) 10 MG tablet, Take 1 tablet (10 mg total) by mouth daily., Disp: 90 tablet, Rfl: 1   meloxicam (MOBIC) 7.5 MG tablet, TAKE 1 TABLET BY MOUTH EVERY DAY, Disp: 90 tablet, Rfl: 1   methocarbamol (ROBAXIN) 750 MG tablet, Take 750 mg by mouth 3 (three) times daily., Disp: , Rfl:    pregabalin (LYRICA) 200 MG capsule, Take 200 mg by mouth 3 (three) times daily., Disp: , Rfl:    rosuvastatin (CRESTOR) 20 MG tablet, Take 1 tablet (20 mg total) by mouth daily., Disp: 90 tablet, Rfl: 3   ALPRAZolam (XANAX) 1 MG tablet, Take 1 tablet (1 mg total) by mouth 3 (three) times daily as needed for anxiety., Disp: 90 tablet, Rfl: 0   amLODipine (NORVASC) 10 MG tablet, Take 1 tablet (10 mg total) by mouth daily., Disp: 90 tablet, Rfl: 1   fluticasone furoate-vilanterol (BREO ELLIPTA) 200-25 MCG/ACT AEPB, Inhale 1 puff into the lungs daily., Disp: 1 each, Rfl: 5   omeprazole (PRILOSEC) 40 MG capsule, TAKE 1 CAPSULE BY MOUTH DAILY BEFORE A MEAL, Disp: 90 capsule, Rfl: 1   Allergies  Allergen Reactions   Amoxicillin Anaphylaxis, Itching and Swelling   Penicillins Itching and Swelling    Throat swelling   Codeine Other (  See Comments)    nausea   Oxycodone Other (See Comments)    Hallucinations   Oxycodone Hcl Other (See Comments)    hallucinations   Oxycodone Hcl    CONSTITUTIONAL: Negative for chills, fatigue, fever, unintentional weight gain and unintentional weight loss.  E/N/T: Negative for ear pain, nasal congestion and sore throat.  CARDIOVASCULAR: Negative  for chest pain, dizziness, palpitations and pedal edema.  RESPIRATORY: Negative for recent cough and dyspnea.  GASTROINTESTINAL: Negative for abdominal pain, acid reflux symptoms, constipation, diarrhea, nausea and vomiting.  MSK: see HPI INTEGUMENTARY: Negative for rash.  NEUROLOGICAL: Negative for dizziness and headaches.  PSYCHIATRIC: Negative for sleep disturbance and to question depression screen.  Negative for depression, negative for anhedonia.             Objective:  PHYSICAL EXAM:   VS: BP 124/82   Pulse 78   Temp (!) 97.1 F (36.2 C) (Temporal)   Ht 5\' 1"  (1.549 m)   Wt 167 lb (75.8 kg)   LMP 06/03/2011   SpO2 100%   BMI 31.55 kg/m   GEN: Well nourished, well developed, in no acute distress   Cardiac: RRR; no murmurs, rubs, or gallops,no edema -  Respiratory:  normal respiratory rate and pattern with no distress - normal breath sounds with no rales, rhonchi, wheezes or rubs  MS: no deformity or atrophy  Skin: warm and dry, no rash  Neuro:  Alert and Oriented x 3, - CN II-Xii grossly intact Psych: euthymic mood, appropriate affect and demeanor   Health Maintenance Due  Topic Date Due   Medicare Annual Wellness (AWV)  Never done   Zoster Vaccines- Shingrix (1 of 2) Never done    There are no preventive care reminders to display for this patient.  Lab Results  Component Value Date   TSH 3.750 12/27/2022   Lab Results  Component Value Date   WBC 6.7 01/25/2023   HGB 11.6 01/25/2023   HCT 36.0 01/25/2023   MCV 92 01/25/2023   PLT 324 01/25/2023   Lab Results  Component Value Date   NA 137 01/30/2023   K 5.1 01/30/2023   CO2 25 01/30/2023   GLUCOSE 81 01/30/2023   BUN 16 01/30/2023   CREATININE 0.93 01/30/2023   BILITOT 0.3 12/27/2022   ALKPHOS 73 12/27/2022   AST 12 12/27/2022   ALT 6 12/27/2022   PROT 6.7 12/27/2022   ALBUMIN 4.3 12/27/2022   CALCIUM 9.8 01/30/2023   ANIONGAP 11 11/14/2022   EGFR 71 01/30/2023   Lab Results  Component  Value Date   CHOL 141 09/25/2022   Lab Results  Component Value Date   HDL 42 09/25/2022   Lab Results  Component Value Date   LDLCALC 76 09/25/2022   Lab Results  Component Value Date   TRIG 126 09/25/2022   Lab Results  Component Value Date   CHOLHDL 3.4 09/25/2022   Lab Results  Component Value Date   HGBA1C 5.2 09/25/2022      Assessment & Plan:   Problem List Items Addressed This Visit       Cardiovascular and Mediastinum   Benign essential hypertension - Primary   Relevant Orders   CBC with Differential/Platelet   Comprehensive metabolic panel   TSH Continue meds as directed     GERD Continue med      Need for flu vaccine Trivalent fluclevex given  Generalized anxiety disorder  Continue meds Other Visit Diagnoses     Vitamin D deficiency       Relevant Orders   VITAMIN D 25 Hydroxy (Vit-D Deficiency, Fractures)   COPD Continue current inhalers Recommend stop smoking         Chronic low back pain with spinal stenosis Continue meds Rx for tramadol Continue PT Follow up with neurosurgeon as directed                               Meds ordered this encounter  Medications   lisinopril (ZESTRIL) 10 MG tablet    Sig: Take 1 tablet (10 mg total) by mouth daily.    Dispense:  90 tablet    Refill:  1    Order Specific Question:   Supervising Provider    Answer:   Marianne Sofia [[id number]]   ALPRAZolam (XANAX) 1 MG tablet    Sig: Take 1 tablet (1 mg total) by mouth 3 (three) times daily as needed for anxiety.    Dispense:  90 tablet    Refill:  0    Order Specific Question:   Supervising Provider    Answer:   Marianne Sofia [[id number]]   amLODipine (NORVASC) 10 MG tablet    Sig: Take 1 tablet (10 mg total) by mouth daily.    Dispense:  90 tablet    Refill:  1    Order Specific Question:   Supervising Provider    Answer:   Rondrick Barreira, Huntley Dec [[id number]]   fluticasone furoate-vilanterol (BREO ELLIPTA) 200-25 MCG/ACT AEPB    Sig:  Inhale 1 puff into the lungs daily.    Dispense:  1 each    Refill:  5    Order Specific Question:   Supervising Provider    Answer:   Celena Lanius, Hunt[place] Dec [[id number]]   omeprazole (PRILOSEC) 40 MG capsule    Sig: TAKE 1 CAPSULE BY MOUTH DAILY BEFORE A MEAL    Dispense:  90 capsule    Refill:  1    Order Specific Question:   Supervising Provider    AnswerMarianne Sofia [[id number]]    Follow-up: Return in about 3 months (around 05/28/2023) for chronic fasting follow-up.    SARA R Tuleen Mandelbaum, PA-C

## 2023-02-26 LAB — CBC WITH DIFFERENTIAL/PLATELET
Basophils Absolute: 0 10*3/uL (ref 0.0–0.2)
Basos: 1 %
EOS (ABSOLUTE): 0.1 10*3/uL (ref 0.0–0.4)
Eos: 1 %
Hematocrit: 39.9 % (ref 34.0–46.6)
Hemoglobin: 12.7 g/dL (ref 11.1–15.9)
Immature Grans (Abs): 0 10*3/uL (ref 0.0–0.1)
Immature Granulocytes: 1 %
Lymphocytes Absolute: 1.1 10*3/uL (ref 0.7–3.1)
Lymphs: 18 %
MCH: 29.9 pg (ref 26.6–33.0)
MCHC: 31.8 g/dL (ref 31.5–35.7)
MCV: 94 fL (ref 79–97)
Monocytes Absolute: 0.4 10*3/uL (ref 0.1–0.9)
Monocytes: 7 %
Neutrophils Absolute: 4.6 10*3/uL (ref 1.4–7.0)
Neutrophils: 72 %
Platelets: 354 10*3/uL (ref 150–450)
RBC: 4.25 x10E6/uL (ref 3.77–5.28)
RDW: 14.6 % (ref 11.7–15.4)
WBC: 6.2 10*3/uL (ref 3.4–10.8)

## 2023-02-26 LAB — LIPID PANEL
Chol/HDL Ratio: 3 ratio (ref 0.0–4.4)
Cholesterol, Total: 133 mg/dL (ref 100–199)
HDL: 44 mg/dL (ref >39)
LDL Chol Calc (NIH): 63 mg/dL (ref 0–99)
Triglycerides: 153 mg/dL — ABNORMAL HIGH (ref 0–149)
VLDL Cholesterol Cal: 26 mg/dL (ref 5–40)

## 2023-02-26 LAB — COMPREHENSIVE METABOLIC PANEL
ALT: 10 [IU]/L (ref 0–32)
AST: 19 [IU]/L (ref 0–40)
Albumin: 4.8 g/dL (ref 3.8–4.9)
Alkaline Phosphatase: 81 [IU]/L (ref 44–121)
BUN/Creatinine Ratio: 11 (ref 9–23)
BUN: 11 mg/dL (ref 6–24)
Bilirubin Total: <0.2 mg/dL (ref 0.0–1.2)
CO2: 18 mmol/L — ABNORMAL LOW (ref 20–29)
Calcium: 9.8 mg/dL (ref 8.7–10.2)
Chloride: 100 mmol/L (ref 96–106)
Creatinine, Ser: 0.96 mg/dL (ref 0.57–1.00)
Globulin, Total: 2.6 g/dL (ref 1.5–4.5)
Glucose: 90 mg/dL (ref 70–99)
Potassium: 5.4 mmol/L — ABNORMAL HIGH (ref 3.5–5.2)
Sodium: 138 mmol/L (ref 134–144)
Total Protein: 7.4 g/dL (ref 6.0–8.5)
eGFR: 69 mL/min/{1.73_m2} (ref >59)

## 2023-02-26 LAB — IRON,TIBC AND FERRITIN PANEL
Ferritin: 88 ng/mL (ref 15–150)
Iron Saturation: 15 % (ref 15–55)
Iron: 76 ug/dL (ref 27–159)
Total Iron Binding Capacity: 514 ug/dL — ABNORMAL HIGH (ref 250–450)
UIBC: 438 ug/dL — ABNORMAL HIGH (ref 131–425)

## 2023-02-26 LAB — TSH: TSH: 1.79 u[IU]/mL (ref 0.450–4.500)

## 2023-02-26 LAB — VITAMIN D 25 HYDROXY (VIT D DEFICIENCY, FRACTURES): Vit D, 25-Hydroxy: 35.6 ng/mL (ref 30.0–100.0)

## 2023-02-27 ENCOUNTER — Telehealth: Payer: Self-pay

## 2023-02-27 NOTE — Telephone Encounter (Signed)
Copied from CRM [id number]. Topic: Clinical - Lab/Test Results >> Feb 27, 2023 12:23 PM Kaitlyn Diaz wrote: Reason for CRM: patient received a call regarding her blood work she did last week . Went over results with patient

## 2023-03-06 DIAGNOSIS — T8859XA Other complications of anesthesia, initial encounter: Secondary | ICD-10-CM | POA: Insufficient documentation

## 2023-03-06 DIAGNOSIS — F419 Anxiety disorder, unspecified: Secondary | ICD-10-CM | POA: Insufficient documentation

## 2023-03-06 DIAGNOSIS — Z9889 Other specified postprocedural states: Secondary | ICD-10-CM | POA: Insufficient documentation

## 2023-03-06 DIAGNOSIS — G709 Myoneural disorder, unspecified: Secondary | ICD-10-CM | POA: Insufficient documentation

## 2023-03-06 DIAGNOSIS — N390 Urinary tract infection, site not specified: Secondary | ICD-10-CM | POA: Insufficient documentation

## 2023-03-06 DIAGNOSIS — Z8489 Family history of other specified conditions: Secondary | ICD-10-CM | POA: Insufficient documentation

## 2023-03-06 DIAGNOSIS — I10 Essential (primary) hypertension: Secondary | ICD-10-CM | POA: Insufficient documentation

## 2023-03-06 DIAGNOSIS — M1990 Unspecified osteoarthritis, unspecified site: Secondary | ICD-10-CM | POA: Insufficient documentation

## 2023-03-06 DIAGNOSIS — I517 Cardiomegaly: Secondary | ICD-10-CM | POA: Insufficient documentation

## 2023-03-06 DIAGNOSIS — D649 Anemia, unspecified: Secondary | ICD-10-CM | POA: Insufficient documentation

## 2023-03-06 DIAGNOSIS — K219 Gastro-esophageal reflux disease without esophagitis: Secondary | ICD-10-CM | POA: Insufficient documentation

## 2023-03-06 DIAGNOSIS — Z8719 Personal history of other diseases of the digestive system: Secondary | ICD-10-CM | POA: Insufficient documentation

## 2023-03-06 DIAGNOSIS — J189 Pneumonia, unspecified organism: Secondary | ICD-10-CM | POA: Insufficient documentation

## 2023-03-06 DIAGNOSIS — G971 Other reaction to spinal and lumbar puncture: Secondary | ICD-10-CM | POA: Insufficient documentation

## 2023-03-06 DIAGNOSIS — J449 Chronic obstructive pulmonary disease, unspecified: Secondary | ICD-10-CM | POA: Insufficient documentation

## 2023-03-06 DIAGNOSIS — J45909 Unspecified asthma, uncomplicated: Secondary | ICD-10-CM | POA: Insufficient documentation

## 2023-03-06 DIAGNOSIS — T883XXA Malignant hyperthermia due to anesthesia, initial encounter: Secondary | ICD-10-CM | POA: Insufficient documentation

## 2023-03-06 DIAGNOSIS — Z85820 Personal history of malignant melanoma of skin: Secondary | ICD-10-CM | POA: Insufficient documentation

## 2023-03-12 ENCOUNTER — Ambulatory Visit: Payer: Medicare HMO

## 2023-03-18 ENCOUNTER — Other Ambulatory Visit: Payer: Self-pay

## 2023-03-21 ENCOUNTER — Telehealth: Payer: Self-pay

## 2023-03-21 NOTE — Telephone Encounter (Signed)
-----   Message from Marlyn Corporal Madireddy sent at 03/19/2023  9:24 AM EST ----- Please inform her that her 30-day heart monitor results were unremarkable.  We can review them further at her follow-up office visits. Does not appear she has access to MyChart but with last login back in 2023.

## 2023-03-21 NOTE — Telephone Encounter (Addendum)
LM to return my call. 

## 2023-03-22 ENCOUNTER — Telehealth: Payer: Self-pay

## 2023-03-22 NOTE — Telephone Encounter (Signed)
-----   Message from [facility] Raquon Milledge P sent at 03/21/2023  4:25 PM EST -----  ----- Message ----- From: Luretha Murphy, MD Sent: 03/19/2023   9:24 AM EST To: Neena Rhymes, RN  Please inform her that her 30-day heart monitor results were unremarkable.  We can review them further at her follow-up office visits. Does not appear she has access to MyChart but with last login back in 2023.

## 2023-03-22 NOTE — Telephone Encounter (Signed)
Patient notified through my chart.

## 2023-03-26 ENCOUNTER — Other Ambulatory Visit: Payer: Self-pay | Admitting: Physician Assistant

## 2023-03-26 DIAGNOSIS — F411 Generalized anxiety disorder: Secondary | ICD-10-CM

## 2023-04-04 ENCOUNTER — Other Ambulatory Visit: Payer: Self-pay | Admitting: Physician Assistant

## 2023-04-04 DIAGNOSIS — E782 Mixed hyperlipidemia: Secondary | ICD-10-CM

## 2023-04-15 ENCOUNTER — Ambulatory Visit (INDEPENDENT_AMBULATORY_CARE_PROVIDER_SITE_OTHER): Payer: Medicare HMO | Admitting: Physician Assistant

## 2023-04-15 ENCOUNTER — Encounter: Payer: Self-pay | Admitting: Physician Assistant

## 2023-04-15 VITALS — BP 126/62 | HR 108 | Temp 97.1°F | Ht 61.0 in | Wt 164.0 lb

## 2023-04-15 DIAGNOSIS — D508 Other iron deficiency anemias: Secondary | ICD-10-CM | POA: Diagnosis not present

## 2023-04-15 DIAGNOSIS — I10 Essential (primary) hypertension: Secondary | ICD-10-CM | POA: Diagnosis not present

## 2023-04-15 DIAGNOSIS — F418 Other specified anxiety disorders: Secondary | ICD-10-CM | POA: Diagnosis not present

## 2023-04-15 MED ORDER — BUPROPION HCL ER (XL) 150 MG PO TB24: 150.0000 mg | ORAL_TABLET | Freq: Every day | ORAL | 2 refills | Status: DC

## 2023-04-15 NOTE — Progress Notes (Signed)
 Subjective:  Patient ID: Kaitlyn Diaz, female    DOB: [birth year]  Age: 58 y.o. MRN: [id number]  Chief Complaint  Patient presents with   Discuss medications    HPI Pt with known iron  deficiency anemia.  She was set up with Dr Larene for evaluation and colonoscopy however pt missed that appt and has not scheduled a follow up yet - states she will do that She says several weeks ago she got very constipated and bloated and had to use several laxatives to relieve symptoms and since that time stopped her iron  supplements  Pt states that she was told her potassium was elevated a few months ago and had to take medication to lower it --- since that time has not been on any supplements but has not had labwork rechecked  Pt currently on buspar , xanax  and cymbalta  for depression and anxiety - she states that since she had cardiac cath she seems to worry more and feel overwhelmed.  She would like to have medications adjusted - currently she is on highest dose of her current meds and due to her chronic back pain I recommend to continue cymbalta  but perhaps add in wellbutrin  to help breakthrough symptoms Pt defers to fill out PHQ     02/25/2023   10:49 AM 11/14/2022    9:35 AM 11/06/2022   10:41 AM 06/12/2021   11:40 AM 11/22/2020    1:39 PM  Depression screen PHQ 2/9  Decreased Interest 1 0 2 2 0  Down, Depressed, Hopeless 2 0 2 3 0  PHQ - 2 Score 3 0 4 5 0  Altered sleeping 1  3 3    Tired, decreased energy 3  3 3    Change in appetite 1  0 0   Feeling bad or failure about yourself  1  2 3    Trouble concentrating 2  0 0   Moving slowly or fidgety/restless 3  0 0   Suicidal thoughts 0  0 0   PHQ-9 Score 14  12 14    Difficult doing work/chores Very difficult            03/23/2021    9:32 AM 06/12/2021   11:39 AM 05/02/2022    1:28 PM 11/06/2022   10:40 AM  Fall Risk  Falls in the past year? 0 0 0 0  Was there an injury with Fall? 0 0 0 0  Fall Risk Category Calculator 0 0 0 0  Fall  Risk Category (Retired) Low Low    (RETIRED) Patient Fall Risk Level Low fall risk Low fall risk    Patient at Risk for Falls Due to No Fall Risks  No Fall Risks No Fall Risks  Fall risk Follow up   Falls evaluation completed Falls evaluation completed     ROS CONSTITUTIONAL: Negative for chills, fatigue, fever, E/N/T: Negative for ear pain, nasal congestion and sore throat.  CARDIOVASCULAR: Negative for chest pain, dizziness, palpitations and pedal edema.  RESPIRATORY: Negative for recent cough and dyspnea.  GASTROINTESTINAL:see HPI MSK: Negative for arthralgias and myalgias.  INTEGUMENTARY: Negative for rash.  NEUROLOGICAL: Negative for dizziness and headaches.  PSYCHIATRIC: see HPI   Current Outpatient Medications:    albuterol  (VENTOLIN  HFA) 108 (90 Base) MCG/ACT inhaler, Inhale 1 puff into the lungs 3 (three) times daily as needed. For shortness of breath (Patient taking differently: Inhale 1 puff into the lungs 3 (three) times daily as needed for wheezing or shortness of breath. For shortness of breath), Disp:  1 each, Rfl: 3   ALPRAZolam  (XANAX ) 1 MG tablet, TAKE 1 TABLET BY MOUTH 3 TIMES DAILY AS NEEDED FOR ANXIETY, Disp: 90 tablet, Rfl: 0   amLODipine  (NORVASC ) 10 MG tablet, Take 1 tablet (10 mg total) by mouth daily., Disp: 90 tablet, Rfl: 1   ammonium lactate (AMLACTIN) 12 % cream, Apply 1 Application topically as needed for dry skin., Disp: , Rfl:    aspirin  EC 81 MG tablet, Take 81 mg by mouth daily. Swallow whole., Disp: , Rfl:    buPROPion  (WELLBUTRIN  XL) 150 MG 24 hr tablet, Take 1 tablet (150 mg total) by mouth daily., Disp: 30 tablet, Rfl: 2   busPIRone  (BUSPAR ) 15 MG tablet, TAKE 1 TABLET BY MOUTH 2 TIMES A DAY (Patient taking differently: Take 15 mg by mouth daily.), Disp: 180 tablet, Rfl: 0   DULoxetine  (CYMBALTA ) 60 MG capsule, TAKE 1 CAPSULE BY MOUTH 2 TIMES DAILY, Disp: 180 capsule, Rfl: 0   etodolac  (LODINE ) 400 MG tablet, Take 400 mg by mouth 2 (two) times daily.,  Disp: , Rfl:    fenofibrate  micronized (LOFIBRA) 134 MG capsule, Take 1 capsule (134 mg total) by mouth daily before breakfast. TAKE 1 CAPSULE BY MOUTH ONCE DAILY, Disp: 90 capsule, Rfl: 1   fluticasone  furoate-vilanterol (BREO ELLIPTA ) 200-25 MCG/ACT AEPB, Inhale 1 puff into the lungs daily., Disp: 1 each, Rfl: 5   folic acid  (FOLVITE ) 400 MCG tablet, Take 400 mcg by mouth daily., Disp: , Rfl:    lisinopril  (ZESTRIL ) 10 MG tablet, Take 1 tablet (10 mg total) by mouth daily., Disp: 90 tablet, Rfl: 1   methocarbamol  (ROBAXIN ) 750 MG tablet, Take 750 mg by mouth 3 (three) times daily., Disp: , Rfl:    omeprazole  (PRILOSEC) 40 MG capsule, TAKE 1 CAPSULE BY MOUTH DAILY BEFORE A MEAL, Disp: 90 capsule, Rfl: 1   pregabalin  (LYRICA ) 200 MG capsule, Take 200 mg by mouth 3 (three) times daily., Disp: , Rfl:    rosuvastatin  (CRESTOR ) 20 MG tablet, Take 1 tablet (20 mg total) by mouth daily., Disp: 90 tablet, Rfl: 3   traMADol  (ULTRAM ) 50 MG tablet, Take 1 tablet (50 mg total) by mouth 2 (two) times daily., Disp: 60 tablet, Rfl: 0  Past Medical History:  Diagnosis Date   Abnormal bruising 11/06/2022   Abnormal EKG 12/27/2022   Abnormal mammogram 12/10/2013   Absolute anemia 05/30/2022   Acquired hallux rigidus of left foot 06/09/2021   Acute laryngopharyngitis 07/13/2020   Anemia    Anxiety    panic attacks, claustophobic on xanax    Aortic atherosclerosis (HCC) 09/03/2021   Arthritis    Arthritis of carpometacarpal joint 07/09/2014   Asthma    Benign essential hypertension 12/10/2013   Bilateral hand numbness 10/08/2022   Brachial neuritis or radiculitis 12/10/2013   Bunion of right foot 06/09/2021   CAD, elevated calcium  score 439, severe proximal to mid LAD lesion, difficult to assess given calcification. 01/25/2023   Cervical radiculopathy 12/10/2013   Chest pain of uncertain etiology 12/27/2022   Chronic left-sided low back pain with left-sided sciatica 11/15/2020   Complication of  anesthesia    Difficult to put to sleep, Has run fever, runs in famly   Compression fracture of L1 lumbar vertebra, closed, initial encounter (HCC) 01/04/2023   Contusion of left leg 11/08/2022   COPD (chronic obstructive pulmonary disease) (HCC)    DDD (degenerative disc disease), cervical 12/10/2013   DDD (degenerative disc disease), lumbosacral 12/10/2013   Depressive disorder 05/06/2013   STORY:  cont INderal LA for one week, then switch to Inderal 40mg  1 a day   for 2 weeks, then stop.     Displacement of lumbar intervertebral disc without myelopathy 01/27/2013   Enlarged heart    hx of   Esophageal reflux 12/10/2013   Essential (primary) hypertension    Essential tremor 05/06/2013   Formatting of this note might be different from the original.  STORY: due to depression on Inderal will taper it and try topirimate,  ADR discussed.  Will not use primidone due to risk of sedation.     Family history of anesthesia complication    malignant hyperthermia on mother's side of family   Female stress incontinence 12/10/2013   Generalized anxiety disorder    GERD (gastroesophageal reflux disease)    H/O hiatal hernia    hx of   Hematochezia 12/10/2013   Hidradenitis 12/10/2013   History of melanoma    Hypertension    Dr. Rexene Crazier, medical physician [phone]   Low back pain 12/10/2013   Formatting of this note might be different from the original.  Overview:   IMO Problem List Replacer Jan. 2016  Formatting of this note might be different from the original.  IMO Problem List Replacer Jan. 2016     Lumbosacral spondylosis without myelopathy 12/10/2013   Major depressive disorder, single episode, unspecified 05/06/2013   Formatting of this note might be different from the original.  Overview:   STORY: cont INderal LA for one week, then switch to Inderal 40mg  1 a day for 2 weeks, then stop.  Formatting of this note might be different from the original.  STORY: cont INderal LA for one  week, then switch to Inderal 40mg  1 a day for 2 weeks, then stop.     Malignant hyperthermia    Menopausal and postmenopausal disorder 12/10/2013   Metatarsalgia of both feet 06/09/2021   Mixed hyperlipidemia    Need for tetanus, diphtheria, and acellular pertussis (Tdap) vaccine 07/13/2020   Neuromuscular disorder (HCC)    85 % nerve damage in left leg   Other emphysema (HCC) 09/03/2021   Other fatigue 05/30/2022   Palpitations 01/07/2023   Panic disorder without agoraphobia 12/10/2013   Pilonidal cyst 12/10/2013   Pneumonia    hx of pneumonia x 2   Polyarthralgia 12/06/2015   PONV (postoperative nausea and vomiting)    Recurrent falls 01/07/2023   RLS (restless legs syndrome) 12/10/2013   Spinal headache    Spondylosis of cervical region without myelopathy or radiculopathy 12/03/2017   Syncope and collapse 01/07/2023   Tobacco abuse 05/30/2022   Urinary tract infection    and kidney infection Hx of   Vitamin D  deficiency 09/03/2021   Objective:  PHYSICAL EXAM:   BP 126/62 (BP Location: Left Arm, Patient Position: Sitting)   Pulse (!) 108   Temp (!) 97.1 F (36.2 C) (Temporal)   Ht 5' 1 (1.549 m)   Wt 164 lb (74.4 kg)   LMP 06/03/2011   SpO2 98%   BMI 30.99 kg/m    GEN: Well nourished, well developed, in no acute distress  Cardiac: RRR; no murmurs, rubs, or gallops,no edema - Respiratory:  normal respiratory rate and pattern with no distress - normal breath sounds with no rales, rhonchi, wheezes or rubs  Psych: euthymic mood, appropriate affect and demeanor  Assessment & Plan:    Other iron  deficiency anemia -     CBC with Differential/Platelet -     Iron , TIBC and  Ferritin Panel Pt to call and schedule with GI (referral already made with [facility]) Benign essential hypertension -     CBC with Differential/Platelet -     Comprehensive metabolic panel Continue current meds Depression with anxiety -     buPROPion  HCl ER (XL); Take 1 tablet (150 mg total)  by mouth daily.  Dispense: 30 tablet; Refill: 2 Continue other meds as directed    Follow-up: Return for as scheduled for chronic visit in February.  An After Visit Summary was printed and given to the patient.  CAMIE JONELLE NICHOLAUS DEVON[facility] [phone]

## 2023-04-16 LAB — COMPREHENSIVE METABOLIC PANEL
ALT: 11 [IU]/L (ref 0–32)
AST: 18 [IU]/L (ref 0–40)
Albumin: 4.9 g/dL (ref 3.8–4.9)
Alkaline Phosphatase: 84 [IU]/L (ref 44–121)
BUN/Creatinine Ratio: 13 (ref 9–23)
BUN: 13 mg/dL (ref 6–24)
Bilirubin Total: 0.3 mg/dL (ref 0.0–1.2)
CO2: 20 mmol/L (ref 20–29)
Calcium: 9.7 mg/dL (ref 8.7–10.2)
Chloride: 105 mmol/L (ref 96–106)
Creatinine, Ser: 1.01 mg/dL — ABNORMAL HIGH (ref 0.57–1.00)
Globulin, Total: 2.3 g/dL (ref 1.5–4.5)
Glucose: 117 mg/dL — ABNORMAL HIGH (ref 70–99)
Potassium: 4.9 mmol/L (ref 3.5–5.2)
Sodium: 143 mmol/L (ref 134–144)
Total Protein: 7.2 g/dL (ref 6.0–8.5)
eGFR: 65 mL/min/{1.73_m2} (ref >59)

## 2023-04-16 LAB — CBC WITH DIFFERENTIAL/PLATELET
Basophils Absolute: 0 10*3/uL (ref 0.0–0.2)
Basos: 1 %
EOS (ABSOLUTE): 0 10*3/uL (ref 0.0–0.4)
Eos: 1 %
Hematocrit: 40.1 % (ref 34.0–46.6)
Hemoglobin: 13.1 g/dL (ref 11.1–15.9)
Immature Grans (Abs): 0 10*3/uL (ref 0.0–0.1)
Immature Granulocytes: 1 %
Lymphocytes Absolute: 1.3 10*3/uL (ref 0.7–3.1)
Lymphs: 22 %
MCH: 30.6 pg (ref 26.6–33.0)
MCHC: 32.7 g/dL (ref 31.5–35.7)
MCV: 94 fL (ref 79–97)
Monocytes Absolute: 0.5 10*3/uL (ref 0.1–0.9)
Monocytes: 9 %
Neutrophils Absolute: 4.1 10*3/uL (ref 1.4–7.0)
Neutrophils: 66 %
Platelets: 383 10*3/uL (ref 150–450)
RBC: 4.28 x10E6/uL (ref 3.77–5.28)
RDW: 13.1 % (ref 11.7–15.4)
WBC: 6 10*3/uL (ref 3.4–10.8)

## 2023-04-16 LAB — IRON,TIBC AND FERRITIN PANEL
Ferritin: 86 ng/mL (ref 15–150)
Iron Saturation: 19 % (ref 15–55)
Iron: 92 ug/dL (ref 27–159)
Total Iron Binding Capacity: 486 ug/dL — ABNORMAL HIGH (ref 250–450)
UIBC: 394 ug/dL (ref 131–425)

## 2023-04-22 ENCOUNTER — Other Ambulatory Visit: Payer: Self-pay | Admitting: Physician Assistant

## 2023-04-27 ENCOUNTER — Other Ambulatory Visit: Payer: Self-pay | Admitting: Family Medicine

## 2023-04-27 ENCOUNTER — Other Ambulatory Visit: Payer: Self-pay | Admitting: Physician Assistant

## 2023-04-27 DIAGNOSIS — F411 Generalized anxiety disorder: Secondary | ICD-10-CM

## 2023-05-27 ENCOUNTER — Other Ambulatory Visit: Payer: Self-pay | Admitting: Family Medicine

## 2023-05-27 ENCOUNTER — Other Ambulatory Visit: Payer: Self-pay | Admitting: Physician Assistant

## 2023-05-27 DIAGNOSIS — F411 Generalized anxiety disorder: Secondary | ICD-10-CM

## 2023-05-27 DIAGNOSIS — I10 Essential (primary) hypertension: Secondary | ICD-10-CM

## 2023-05-30 ENCOUNTER — Ambulatory Visit: Payer: Medicare HMO | Admitting: Physician Assistant

## 2023-06-10 ENCOUNTER — Other Ambulatory Visit: Payer: Self-pay | Admitting: Physician Assistant

## 2023-06-10 ENCOUNTER — Other Ambulatory Visit: Payer: Self-pay | Admitting: Family Medicine

## 2023-06-10 DIAGNOSIS — F418 Other specified anxiety disorders: Secondary | ICD-10-CM

## 2023-06-11 ENCOUNTER — Ambulatory Visit (INDEPENDENT_AMBULATORY_CARE_PROVIDER_SITE_OTHER): Payer: Medicare HMO | Admitting: Physician Assistant

## 2023-06-11 ENCOUNTER — Encounter: Payer: Self-pay | Admitting: Physician Assistant

## 2023-06-11 VITALS — BP 124/80 | HR 91 | Temp 97.2°F | Resp 16 | Ht 61.0 in | Wt 164.0 lb

## 2023-06-11 DIAGNOSIS — F411 Generalized anxiety disorder: Secondary | ICD-10-CM | POA: Diagnosis not present

## 2023-06-11 DIAGNOSIS — F418 Other specified anxiety disorders: Secondary | ICD-10-CM

## 2023-06-11 DIAGNOSIS — M5442 Lumbago with sciatica, left side: Secondary | ICD-10-CM

## 2023-06-11 DIAGNOSIS — Z1211 Encounter for screening for malignant neoplasm of colon: Secondary | ICD-10-CM

## 2023-06-11 DIAGNOSIS — R160 Hepatomegaly, not elsewhere classified: Secondary | ICD-10-CM | POA: Diagnosis not present

## 2023-06-11 DIAGNOSIS — R739 Hyperglycemia, unspecified: Secondary | ICD-10-CM | POA: Diagnosis not present

## 2023-06-11 DIAGNOSIS — J438 Other emphysema: Secondary | ICD-10-CM

## 2023-06-11 DIAGNOSIS — K219 Gastro-esophageal reflux disease without esophagitis: Secondary | ICD-10-CM | POA: Diagnosis not present

## 2023-06-11 DIAGNOSIS — E782 Mixed hyperlipidemia: Secondary | ICD-10-CM | POA: Diagnosis not present

## 2023-06-11 DIAGNOSIS — J449 Chronic obstructive pulmonary disease, unspecified: Secondary | ICD-10-CM

## 2023-06-11 DIAGNOSIS — G8929 Other chronic pain: Secondary | ICD-10-CM

## 2023-06-11 DIAGNOSIS — E559 Vitamin D deficiency, unspecified: Secondary | ICD-10-CM | POA: Diagnosis not present

## 2023-06-11 DIAGNOSIS — I10 Essential (primary) hypertension: Secondary | ICD-10-CM | POA: Diagnosis not present

## 2023-06-11 DIAGNOSIS — D508 Other iron deficiency anemias: Secondary | ICD-10-CM | POA: Diagnosis not present

## 2023-06-11 MED ORDER — FENOFIBRATE MICRONIZED 134 MG PO CAPS: 134.0000 mg | ORAL_CAPSULE | Freq: Every day | ORAL | 1 refills | Status: DC

## 2023-06-11 MED ORDER — PREGABALIN 200 MG PO CAPS: ORAL_CAPSULE | ORAL | 0 refills | Status: DC

## 2023-06-11 MED ORDER — BUSPIRONE HCL 15 MG PO TABS: 15.0000 mg | ORAL_TABLET | Freq: Two times a day (BID) | ORAL | 1 refills | Status: DC

## 2023-06-11 NOTE — Progress Notes (Signed)
 Established Patient Office Visit  Subjective:  Patient ID: Kaitlyn Diaz, female    DOB: 10-27-1965  Age: 58 y.o. MRN: [id number]  CC:  Chief Complaint  Patient presents with   Chronic back pain Chronic follow up           HPI JOSELY MOFFAT presents for hypertension     Pt presents for follow up of hypertension.  She is tolerating the medication well without side effects.  Compliance with treatment has been good; she takes her medication as directed, maintains her diet and exercise regimen, and follows up as directed.  She is taking lisinopril 10mg  qd and norvasc 10mg  qd She denies chest pain or dyspnea    Follow up of generalized anxiety disorder.  pt is doing well on her xanax, cymbalta , buspar and wellbutrin - currently doing well on medication and voices no concerns or problems  Pt presents with hyperlipidemia.  Compliance with treatment has been good; she maintains her low cholesterol diet, follows up as directed, and maintains her exercise regimen.  She denies experiencing any hypercholesterolemia related symptoms.  She is currently taking fenofibrate and crestor 20 mg qd   Pt with history of COPD - she is still smoking - she tried chantix but was not able to tolerate medication She uses albuterol prn and breo every day States she is not having any breathing issues at this time  Pt with GERD - stable on prilosec 40mg  qd  Pt with history of Vit D def - is not taking supplement at this time - due for labwork  Pt with history of chronic low back pain - she had surgery many years ago for a fusion of L5/S1 -- she has a diagnosis now of DJD and severe stenosis at L4-L5 - She had been following with [facility] Precious [facility]ing [facility] but no longer seeing him - would like referral to Dr Mariel Craft in [facility] who has seen her in the past for further evaluation and treatment -  She is taking lyrica , mobic and robaxin  Since last visit with me pt has had cardiac  workup including cardiac cath which showed nonobstructive CAD and is now taking aspirin 81mg  qd and increased crestor to 20mg  qd --- lifestyle modifications recommended as well She is due for cardiology follow up and pt will schedule  Pt with iron def anemia and due for colonoscopy - we have tried for her to get in with GI and she was not able to schedule with Dr Jennye Boroughs but now wants referral to GI in [place] Of note she also has an enlarged liver that was noted on prior imaging Past Medical History:  Diagnosis Date   Abnormal bruising 11/06/2022   Abnormal EKG 12/27/2022   Abnormal mammogram 12/10/2013   Absolute anemia 05/30/2022   Acquired hallux rigidus of left foot 06/09/2021   Acute laryngopharyngitis 07/13/2020   Anemia    Anxiety    "panic attacks, claustophobic on xanax"   Aortic atherosclerosis (HCC) 09/03/2021   Arthritis    Arthritis of carpometacarpal joint 07/09/2014   Asthma    Benign essential hypertension 12/10/2013   Bilateral hand numbness 10/08/2022   Brachial neuritis or radiculitis 12/10/2013   Bunion of right foot 06/09/2021   CAD, elevated calcium score 439, severe proximal to mid LAD lesion, difficult to assess given calcification. 01/25/2023   Cervical radiculopathy 12/10/2013   Chest pain of uncertain etiology 12/27/2022   Chronic left-sided low back  pain with left-sided sciatica 11/15/2020   Complication of anesthesia    Difficult to put to sleep, Has run fever, "runs in famly"   Compression fracture of L1 lumbar vertebra, closed, initial encounter (HCC) 01/04/2023   Contusion of left leg 11/08/2022   COPD (chronic obstructive pulmonary disease) (HCC)    DDD (degenerative disc disease), cervical 12/10/2013   DDD (degenerative disc disease), lumbosacral 12/10/2013   Depressive disorder 05/06/2013   STORY: cont INderal LA for one week, then switch to Inderal 40mg  1 a day   for 2 weeks, then stop.     Displacement of lumbar intervertebral disc  without myelopathy 01/27/2013   Enlarged heart    hx of   Esophageal reflux 12/10/2013   Essential (primary) hypertension    Essential tremor 05/06/2013   Formatting of this note might be different from the original.  STORY: due to depression on Inderal will taper it and try topirimate,  ADR discussed.  Will not use primidone due to risk of sedation.     Family history of anesthesia complication    malignant hyperthermia on mother's side of family   Female stress incontinence 12/10/2013   Generalized anxiety disorder    GERD (gastroesophageal reflux disease)    H/O hiatal hernia    hx of   Hematochezia 12/10/2013   Hidradenitis 12/10/2013   History of melanoma    Hypertension    Dr. Syble Creek, medical physician [phone]   Low back pain 12/10/2013   Formatting of this note might be different from the original.  Overview:   IMO Problem List Replacer Jan. 2016  Formatting of this note might be different from the original.  IMO Problem List Replacer Jan. 2016     Lumbosacral spondylosis without myelopathy 12/10/2013   Major depressive disorder, single episode, unspecified 05/06/2013   Formatting of this note might be different from the original.  Overview:   STORY: cont INderal LA for one week, then switch to Inderal 40mg  1 a day for 2 weeks, then stop.  Formatting of this note might be different from the original.  STORY: cont INderal LA for one week, then switch to Inderal 40mg  1 a day for 2 weeks, then stop.     Malignant hyperthermia    Menopausal and postmenopausal disorder 12/10/2013   Metatarsalgia of both feet 06/09/2021   Mixed hyperlipidemia    Need for tetanus, diphtheria, and acellular pertussis (Tdap) vaccine 07/13/2020   Neuromuscular disorder (HCC)    "85 % nerve damage in left leg"   Other emphysema (HCC) 09/03/2021   Other fatigue 05/30/2022   Palpitations 01/07/2023   Panic disorder without agoraphobia 12/10/2013   Pilonidal cyst 12/10/2013   Pneumonia    hx  of pneumonia x 2   Polyarthralgia 12/06/2015   PONV (postoperative nausea and vomiting)    Recurrent falls 01/07/2023   RLS (restless legs syndrome) 12/10/2013   Spinal headache    Spondylosis of cervical region without myelopathy or radiculopathy 12/03/2017   Syncope and collapse 01/07/2023   Tobacco abuse 05/30/2022   Urinary tract infection    and kidney infection Hx of   Vitamin D deficiency 09/03/2021    Past Surgical History:  Procedure Laterality Date   ANTERIOR CERVICAL DECOMP/DISCECTOMY FUSION  12/28/2011   Procedure: ANTERIOR CERVICAL DECOMPRESSION/DISCECTOMY FUSION 2 LEVELS;  Surgeon: Mariam Dollar, MD;  Location: [facility];  Service: Neurosurgery;  Laterality: N/A;  HISTORY OF MALIGNANT HYPERTHERMIA Anterior Cervical Discectomy Fusion of Cervical five-six, Cervical six-seven  BLADDER SUSPENSION     BREAST SURGERY     left breast mass removed   hand mass     1994, multiple masses removed - "all benign"   LEFT HEART CATH AND CORONARY ANGIOGRAPHY N/A 02/01/2023   Procedure: LEFT HEART CATH AND CORONARY ANGIOGRAPHY;  Surgeon: [place], Peter M, MD;  Location: [facility] INVASIVE CV LAB;  Service: Cardiovascular;  Laterality: N/A;   leg mass     left leg  surgically removed   POSTERIOR FUSION LUMBAR SPINE     L4/5   TONSILLECTOMY     and adnoids   TUBAL LIGATION      Family History  Problem Relation Age of Onset   Colon cancer Mother    Stroke Maternal Grandmother    Stomach cancer Maternal Grandfather    Breast cancer Neg Hx     Social History   Socioeconomic History   Marital status: Married    Spouse name: Not on file   Number of children: 2   Years of education: Not on file   Highest education level: GED or equivalent  Occupational History   Occupation: Disabled  Tobacco Use   Smoking status: Every Day    Average packs/day: 0.9 packs/day for 78.0 years (69.5 ttl pk-yrs)    Types: Cigarettes    Start date: 1980   Smokeless tobacco: Never  Vaping Use    Vaping status: Some Days   Substances: Nicotine, Flavoring  Substance and Sexual Activity   Alcohol use: Not Currently    Comment: Drinks occasionally, and consumes wine.   Drug use: No   Sexual activity: Not Currently  Other Topics Concern   Not on file  Social History Narrative   Not on file   Social Drivers of Health   Financial Resource Strain: Low Risk  (11/06/2022)   Overall Financial Resource Strain (CARDIA)    Difficulty of Paying Living Expenses: Not hard at all  Food Insecurity: No Food Insecurity (11/14/2022)   Hunger Vital Sign    Worried About Running Out of Food in the Last Year: Never true    Ran Out of Food in the Last Year: Never true  Transportation Needs: No Transportation Needs (11/14/2022)   PRAPARE - Administrator, Civil Service (Medical): No    Lack of Transportation (Non-Medical): No  Physical Activity: Inactive (11/06/2022)   Exercise Vital Sign    Days of Exercise per Week: 0 days    Minutes of Exercise per Session: 0 min  Stress: No Stress Concern Present (11/06/2022)   [employer] of Occupational Health - Occupational Stress Questionnaire    Feeling of Stress : Not at all  Social Connections: Unknown (01/04/2023)   Received from [facility]   Social Network    Social Network: Not on file  Recent Concern: Social Connections - Moderately Isolated (11/06/2022)   Social Connection and Isolation Panel [NHANES]    Frequency of Communication with Friends and Family: More than three times a week    Frequency of Social Gatherings with Friends and Family: More than three times a week    Attends Religious Services: Never    Database administrator or Organizations: No    Attends Banker Meetings: Never    Marital Status: Married  Catering manager Violence: Not At Risk (01/04/2023)   Received from [facility]   HITS    Over the last 12 months how often did your partner physically hurt you?: Never    Over  the last 12 months how  often did your partner insult you or talk down to you?: Never    Over the last 12 months how often did your partner threaten you with physical harm?: Never    Over the last 12 months how often did your partner scream or curse at you?: Never     Current Outpatient Medications:    albuterol (VENTOLIN HFA) 108 (90 Base) MCG/ACT inhaler, Inhale 1 puff into the lungs 3 (three) times daily as needed. For shortness of breath (Patient taking differently: Inhale 1 puff into the lungs 3 (three) times daily as needed for wheezing or shortness of breath. For shortness of breath), Disp: 1 each, Rfl: 3   ALPRAZolam (XANAX) 1 MG tablet, TAKE 1 TABLET BY MOUTH 3 TIMES DAILY AS NEEDED FOR ANXIETY, Disp: 90 tablet, Rfl: 1   amLODipine (NORVASC) 10 MG tablet, TAKE 1 TABLET BY MOUTH EVERY DAY, Disp: 90 tablet, Rfl: 1   buPROPion (WELLBUTRIN XL) 150 MG 24 hr tablet, Take 1 tablet (150 mg total) by mouth daily., Disp: 30 tablet, Rfl: 2   busPIRone (BUSPAR) 15 MG tablet, TAKE 1 TABLET BY MOUTH 2 TIMES A DAY (Patient taking differently: Take 15 mg by mouth daily.), Disp: 180 tablet, Rfl: 0   DULoxetine (CYMBALTA) 60 MG capsule, TAKE 1 CAPSULE BY MOUTH 2 TIMES DAILY, Disp: 180 capsule, Rfl: 0   etodolac (LODINE) 400 MG tablet, Take 400 mg by mouth 2 (two) times daily., Disp: , Rfl:    fenofibrate micronized (LOFIBRA) 134 MG capsule, Take 1 capsule (134 mg total) by mouth daily before breakfast. TAKE 1 CAPSULE BY MOUTH ONCE DAILY, Disp: 90 capsule, Rfl: 1   fluticasone furoate-vilanterol (BREO ELLIPTA) 200-25 MCG/ACT AEPB, Inhale 1 puff into the lungs daily., Disp: 1 each, Rfl: 5   folic acid (FOLVITE) 400 MCG tablet, Take 400 mcg by mouth daily., Disp: , Rfl:    lisinopril (ZESTRIL) 10 MG tablet, TAKE 1 TABLET BY MOUTH EVERY DAY, Disp: 90 tablet, Rfl: 1   meloxicam (MOBIC) 7.5 MG tablet, TAKE 1 TABLET BY MOUTH EVERY DAY, Disp: 90 tablet, Rfl: 0   methocarbamol (ROBAXIN) 750 MG tablet, Take 750 mg by mouth 3 (three) times  daily., Disp: , Rfl:    omeprazole (PRILOSEC) 40 MG capsule, TAKE 1 CAPSULE BY MOUTH DAILY BEFORE A MEAL, Disp: 90 capsule, Rfl: 1   pregabalin (LYRICA) 200 MG capsule, Take 200 mg by mouth 3 (three) times daily., Disp: , Rfl:    traMADol (ULTRAM) 50 MG tablet, Take 1 tablet (50 mg total) by mouth 2 (two) times daily., Disp: 60 tablet, Rfl: 0   ammonium lactate (AMLACTIN) 12 % cream, Apply 1 Application topically as needed for dry skin., Disp: , Rfl:    aspirin EC 81 MG tablet, Take 81 mg by mouth daily. Swallow whole., Disp: , Rfl:    rosuvastatin (CRESTOR) 20 MG tablet, Take 1 tablet (20 mg total) by mouth daily., Disp: 90 tablet, Rfl: 3   Allergies  Allergen Reactions   Amoxicillin Anaphylaxis, Itching and Swelling   Penicillins Itching and Swelling    Throat swelling   Codeine Other (See Comments)    nausea   Oxycodone Other (See Comments)    Hallucinations   Oxycodone Hcl Other (See Comments)    hallucinations   Oxycodone Hcl    CONSTITUTIONAL: Negative for chills, fatigue, fever, unintentional weight gain and unintentional weight loss.  E/N/T: Negative for ear pain, nasal congestion and sore throat.  CARDIOVASCULAR:  Negative for chest pain, dizziness, palpitations and pedal edema.  RESPIRATORY: Negative for recent cough and dyspnea.  GASTROINTESTINAL: Negative for abdominal pain, acid reflux symptoms, constipation, diarrhea, nausea and vomiting.  MSK: see HPI INTEGUMENTARY: Negative for rash.  NEUROLOGICAL: Negative for dizziness and headaches.  PSYCHIATRIC: Negative for sleep disturbance and to question depression screen.  Negative for depression, negative for anhedonia.        Objective:  PHYSICAL EXAM:   VS: BP 124/80 (BP Location: Right Arm, Patient Position: Sitting, Cuff Size: Normal)   Pulse 91   Temp (!) 97.2 F (36.2 C) (Temporal)   Resp 16   Ht 5\' 1"  (1.549 m)   Wt 164 lb (74.4 kg)   LMP 06/03/2011   SpO2 96%   BMI 30.99 kg/m   GEN: Well nourished,  well developed, in no acute distress   Cardiac: RRR; no murmurs, rubs, or gallops,no edema -  Respiratory:  normal respiratory rate and pattern with no distress - normal breath sounds with no rales, rhonchi, wheezes or rubs GI: normal bowel sounds, no masses or tenderness MS: no deformity or atrophy  Skin: warm and dry, no rash  Neuro:  Alert and Oriented x 3,- CN II-Xii grossly intact Psych: euthymic mood, appropriate affect and demeanor   Health Maintenance Due  Topic Date Due   Medicare Annual Wellness (AWV)  Never done   Zoster Vaccines- Shingrix (1 of 2) Never done   Cervical Cancer Screening (HPV/Pap Cotest)  08/04/2023    There are no preventive care reminders to display for this patient.  Lab Results  Component Value Date   TSH 1.790 02/25/2023   Lab Results  Component Value Date   WBC 6.0 04/15/2023   HGB 13.1 04/15/2023   HCT 40.1 04/15/2023   MCV 94 04/15/2023   PLT 383 04/15/2023   Lab Results  Component Value Date   NA 143 04/15/2023   K 4.9 04/15/2023   CO2 20 04/15/2023   GLUCOSE 117 (H) 04/15/2023   BUN 13 04/15/2023   CREATININE 1.01 (H) 04/15/2023   BILITOT 0.3 04/15/2023   ALKPHOS 84 04/15/2023   AST 18 04/15/2023   ALT 11 04/15/2023   PROT 7.2 04/15/2023   ALBUMIN 4.9 04/15/2023   CALCIUM 9.7 04/15/2023   ANIONGAP 11 11/14/2022   EGFR 65 04/15/2023   Lab Results  Component Value Date   CHOL 133 02/25/2023   Lab Results  Component Value Date   HDL 44 02/25/2023   Lab Results  Component Value Date   LDLCALC 63 02/25/2023   Lab Results  Component Value Date   TRIG 153 (H) 02/25/2023   Lab Results  Component Value Date   CHOLHDL 3.0 02/25/2023   Lab Results  Component Value Date   HGBA1C 5.2 09/25/2022      Assessment & Plan:   Problem List Items Addressed This Visit       Cardiovascular and Mediastinum   Benign essential hypertension - Primary   Relevant Orders   CBC with Differential/Platelet   Comprehensive  metabolic panel   TSH Continue meds as directed     CAD Continue lipitor   Continue asa   Make follow up appt with cardiology        Other   Mixed hyperlipidemia   Relevant Orders   Lipid panel Continue meds and watch diet   Generalized anxiety disorder  Continue meds Other Visit Diagnoses     Vitamin D deficiency  Relevant Orders   VITAMIN D 25 Hydroxy (Vit-D Deficiency, Fractures)   COPD Continue current inhalers Recommend stop smoking         Chronic low back pain with spinal stenosis Continue meds Referral to Dr Mariel Craft as requested by pt      Iron def anemia Enlarged liver   Referral to GI in [place]                      No orders of the defined types were placed in this encounter.   Follow-up: Return in about 4 months (around 10/11/2023) for chronic fasting follow-up.    SARA R Leonard Hendler, PA-C

## 2023-06-12 LAB — CBC WITH DIFFERENTIAL/PLATELET
Basophils Absolute: 0 10*3/uL (ref 0.0–0.2)
Basos: 0 %
EOS (ABSOLUTE): 0 10*3/uL (ref 0.0–0.4)
Eos: 0 %
Hematocrit: 39.1 % (ref 34.0–46.6)
Hemoglobin: 12.6 g/dL (ref 11.1–15.9)
Immature Grans (Abs): 0.1 10*3/uL (ref 0.0–0.1)
Immature Granulocytes: 1 %
Lymphocytes Absolute: 1.2 10*3/uL (ref 0.7–3.1)
Lymphs: 18 %
MCH: 31.1 pg (ref 26.6–33.0)
MCHC: 32.2 g/dL (ref 31.5–35.7)
MCV: 97 fL (ref 79–97)
Monocytes Absolute: 0.6 10*3/uL (ref 0.1–0.9)
Monocytes: 8 %
Neutrophils Absolute: 4.9 10*3/uL (ref 1.4–7.0)
Neutrophils: 73 %
Platelets: 274 10*3/uL (ref 150–450)
RBC: 4.05 x10E6/uL (ref 3.77–5.28)
RDW: 12.9 % (ref 11.7–15.4)
WBC: 6.8 10*3/uL (ref 3.4–10.8)

## 2023-06-12 LAB — TSH: TSH: 1.46 u[IU]/mL (ref 0.450–4.500)

## 2023-06-12 LAB — LIPID PANEL
Chol/HDL Ratio: 2.8 ratio (ref 0.0–4.4)
Cholesterol, Total: 113 mg/dL (ref 100–199)
HDL: 40 mg/dL (ref >39)
LDL Chol Calc (NIH): 50 mg/dL (ref 0–99)
Triglycerides: 127 mg/dL (ref 0–149)
VLDL Cholesterol Cal: 23 mg/dL (ref 5–40)

## 2023-06-12 LAB — COMPREHENSIVE METABOLIC PANEL
ALT: 6 IU/L (ref 0–32)
AST: 13 IU/L (ref 0–40)
Albumin: 4.8 g/dL (ref 3.8–4.9)
Alkaline Phosphatase: 79 IU/L (ref 44–121)
BUN/Creatinine Ratio: 17 (ref 9–23)
BUN: 19 mg/dL (ref 6–24)
Bilirubin Total: <0.2 mg/dL (ref 0.0–1.2)
CO2: 19 mmol/L — ABNORMAL LOW (ref 20–29)
Calcium: 9.8 mg/dL (ref 8.7–10.2)
Chloride: 107 mmol/L — ABNORMAL HIGH (ref 96–106)
Creatinine, Ser: 1.11 mg/dL — ABNORMAL HIGH (ref 0.57–1.00)
Globulin, Total: 2.4 g/dL (ref 1.5–4.5)
Glucose: 87 mg/dL (ref 70–99)
Potassium: 5.6 mmol/L — ABNORMAL HIGH (ref 3.5–5.2)
Sodium: 144 mmol/L (ref 134–144)
Total Protein: 7.2 g/dL (ref 6.0–8.5)
eGFR: 57 mL/min/{1.73_m2} — ABNORMAL LOW (ref >59)

## 2023-06-12 LAB — HEMOGLOBIN A1C
Est. average glucose Bld gHb Est-mCnc: 100 mg/dL
Hgb A1c MFr Bld: 5.1 % (ref 4.8–5.6)

## 2023-06-12 LAB — IRON,TIBC AND FERRITIN PANEL
Ferritin: 85 ng/mL (ref 15–150)
Iron Saturation: 18 % (ref 15–55)
Iron: 84 ug/dL (ref 27–159)
Total Iron Binding Capacity: 458 ug/dL — ABNORMAL HIGH (ref 250–450)
UIBC: 374 ug/dL (ref 131–425)

## 2023-06-12 LAB — VITAMIN D 25 HYDROXY (VIT D DEFICIENCY, FRACTURES): Vit D, 25-Hydroxy: 39.8 ng/mL (ref 30.0–100.0)

## 2023-06-19 NOTE — Telephone Encounter (Signed)
 Tired calling patient back spoke to the nurse at Orthopedic office and it looks like they have tired to call her back this morning, but is getting her voicemail. Stated that nurse left message for patient.   Copied from CRM [id number]. Topic: General - Other >> Jun 19, 2023  9:03 AM Whitney O wrote: Reason for CRM: patient is calling cause she said a referral was suppose to be put in  and I do see a referral for the orthopedic but patient has tried to reach them multiple times and have not been successful . Patient is wanting the nurse to call and get her a appointment with the referral. Patient has tried and tried and tried and it has not worked for her to get a appointment

## 2023-06-25 NOTE — Telephone Encounter (Signed)
 Copied from CRM [id number]. Topic: Clinical - Request for Lab/Test Order >> Jun 25, 2023  8:19 AM Ivette P wrote: Reason for CRM: Pt called in about having a Lung test done, Pt states she has one every year and would like to know when her next one is. Do not see referral in chart.    Pt callback [phone]

## 2023-07-02 ENCOUNTER — Other Ambulatory Visit: Payer: Self-pay | Admitting: Physician Assistant

## 2023-07-02 ENCOUNTER — Telehealth: Payer: Self-pay

## 2023-07-02 DIAGNOSIS — G8929 Other chronic pain: Secondary | ICD-10-CM

## 2023-07-02 NOTE — Telephone Encounter (Signed)
 Referral made

## 2023-07-02 NOTE — Telephone Encounter (Signed)
 Copied from CRM [id number]. Topic: Referral - Request for Referral >> Jul 02, 2023  9:33 AM Franchot Heidelberg wrote: Did the patient discuss referral with their provider in the last year? Yes (If No - schedule appointment) (If Yes - send message)  Appointment offered? No  Type of order/referral and detailed reason for visit: Neurologist   -Essential tremors   Discussed with PCP   Preference of office, provider, location:   Atlee Abide or Brooke Pace at [facility]  [phone]   If referral order, have you been seen by this specialty before? No (If Yes, this issue or another issue? When? Where?  Can we respond through MyChart? Yes

## 2023-07-12 ENCOUNTER — Ambulatory Visit (HOSPITAL_COMMUNITY)
Admission: EM | Admit: 2023-07-12 | Discharge: 2023-07-12 | Disposition: A | Discharge status: Home / Self Care | Attending: Family | Admitting: Family

## 2023-07-12 DIAGNOSIS — M549 Dorsalgia, unspecified: Secondary | ICD-10-CM | POA: Diagnosis not present

## 2023-07-12 DIAGNOSIS — F1310 Sedative, hypnotic or anxiolytic abuse, uncomplicated: Secondary | ICD-10-CM | POA: Diagnosis not present

## 2023-07-12 DIAGNOSIS — G8929 Other chronic pain: Secondary | ICD-10-CM | POA: Diagnosis not present

## 2023-07-12 DIAGNOSIS — F1390 Sedative, hypnotic, or anxiolytic use, unspecified, uncomplicated: Secondary | ICD-10-CM | POA: Diagnosis not present

## 2023-07-12 NOTE — Progress Notes (Signed)
   07/12/23 1437  [facility] Triage Screening (Walk-ins at [facility] only)  How Did You Hear About [place]? Family/Friend  What Is the Reason for Your Visit/Call Today? Kaitlyn Diaz presents to [facility] voluntarily accompanied by her daughter. Pt states that she is out of her nerve medication and she doesn't feel normal. Pt states that she is prescribed Xanax to take 3 times a day, but she sometimes over take them and feels not like herself. Pt currently denies SI, HI, AVH and alcohol/drug use.  How Long Has This Been Causing You Problems? 1 wk - 1 month  Have You Recently Had Any Thoughts About Hurting Yourself? No  Are You Planning to Commit Suicide/Harm Yourself At This time? No  Have you Recently Had Thoughts About Hurting Someone Kaitlyn Diaz? No  Are You Planning To Harm Someone At This Time? No  Physical Abuse Denies  Verbal Abuse Denies  Sexual Abuse Denies  Exploitation of patient/patient's resources Denies  Self-Neglect Denies  Are you currently experiencing any auditory, visual or other hallucinations? No  Have You Used Any Alcohol or Drugs in the Past 24 Hours? No  Do you have any current medical co-morbidities that require immediate attention? Yes  Please describe current medical co-morbidities that require immediate attention: hypertension  Clinician description of patient physical appearance/behavior: calm, cooperative, pleasant  What Do You Feel Would Help You the Most Today? Treatment for Depression or other mood problem;Medication(s)  If access to [facility] was not available, would you have sought care in the Emergency Department? No  Determination of Need Routine (7 days)  Options For Referral Medication Management;Outpatient Therapy

## 2023-07-12 NOTE — ED Notes (Signed)
 Pt left prior to receiving AVS per provider.

## 2023-07-12 NOTE — ED Provider Notes (Signed)
 Behavioral Health Urgent Care Medical Screening Exam  Patient Name: Kaitlyn Diaz MRN: [id number] Date of Evaluation: 07/12/23 Chief Complaint:   Diagnosis:  Final diagnoses:  Benzodiazepine misuse    History of Present illness: Kaitlyn Diaz is a 58 y.o. female.  Patient presents voluntarily to [facility] behavioral health for walk-in assessment. Encouraged to seek assessment by her daughter and PCP.  Patient is assessed by this provider face-to-face.  She is seated assessment area, no apparent distress.  She is alert and oriented, pleasant and cooperative during assessment.  She is appropriately groomed.  Eye contact fair.  Kaitlyn Diaz states "I am prescribed 3 Xanax a day, at some time during the month I overtake them and I have to go without for the last week.  This week I do not feel productive, I have been doing this every month for about a year.  My daughters think that I have a problem, I do not want to stop my benzodiazepine medicine." Patient reports last dose alprazolam 3-4 days ago.  Kaitlyn Diaz denies alcohol and substance use aside from misuse of prescription medication, alprazolam. Current Rx alprazolam 1mg  TID PRN.  Kaitlyn Diaz reports history of "bad anxiety."  Per chart review mental health diagnoses include major depressive disorder and panic disorder without agoraphobia.  Currently managed by primary care provider, NP Fidela Juneau.  Would like to be linked with individual counseling.  Kaitlyn Diaz is compliant with a combination of medications including alprazolam, BuSpar and pregabalin.  Denies history of inpatient psychiatric hospitalization.  No family mental health history reported.  Patient denies crisis criteria.  Denies suicidal or homicidal ideation.  Denies history of suicide attempts, denies history of nonsuicidal self-harm behavior.  She denies auditory and visual hallucinations.  There is no evidence of delusional thought content and no indication that patient is responding to  internal stimuli.  Patient resides in [place], [place] with her husband.  She denies access to weapons.  She endorses average sleep and appetite.  She is not employed, receives disability income related to chronic back pain.  Patient remains voluntary.  Reviewed plan to include admission to facility based crisis unit for benzodiazepine taper.  Patient declines.  Patient states "I want to stay on my medicine, I am afraid the doctor will not put me back on my medication."  Discussed potential risks including seizure and death, reiterated safest option admission. patient verbalizes understanding.  Patient offered support and encouragement.  Per patient's daughter Kaitlyn Diaz, patient's last dose of alprazolam approximately 1 week ago.  Patient's daughter reports concern patient has had episodes of incontinence when  using alprazolam.  No safety concerns reported.   Patient and family are educated and verbalize understanding of mental health resources and other crisis services in the community. They are instructed to call 911 and present to the nearest emergency room should patient experience any suicidal/homicidal ideation, auditory/visual/hallucinations, or detrimental worsening of mental health condition.     Flowsheet Row ED from 07/12/2023 in [facility] Admission (Discharged) from 02/01/2023 in [facility] CARDIAC CATH LAB  C-SSRS RISK CATEGORY No Risk No Risk       Psychiatric Specialty Exam  Presentation  General Appearance:Appropriate for Environment; Casual  Eye Contact:Good  Speech:Clear and Coherent; Normal Rate  Speech Volume:Normal  Handedness:Right   Mood and Affect  Mood: Euthymic  Affect: Appropriate; Congruent   Thought Process  Thought Processes: Coherent; Goal Directed; Linear  Descriptions of Associations:Intact  Orientation:Full (Time, Place and Person)  Thought Content:Logical; WDL     Hallucinations:None  Ideas of Reference:None  Suicidal Thoughts:No  Homicidal Thoughts:No   Sensorium  Memory: Immediate Good; Recent Good  Judgment: Good  Insight: Good   Executive Functions  Concentration: Good  Attention Span: Good  Recall: Good  Fund of Knowledge: Good  Language: Good   Psychomotor Activity  Psychomotor Activity: Normal   Assets  Assets: Communication Skills; Desire for Improvement; Financial Resources/Insurance; Housing; Physical Health; Resilience; Social Support; Transportation   Sleep  Sleep: Good  Number of hours: No data recorded  Physical Exam: Physical Exam Vitals and nursing note reviewed.  Constitutional:      Appearance: Normal appearance. She is well-developed and normal weight.  HENT:     Head: Normocephalic and atraumatic.     Nose: Nose normal.  Cardiovascular:     Rate and Rhythm: Normal rate.  Pulmonary:     Effort: Pulmonary effort is normal.  Musculoskeletal:        General: Normal range of motion.     Cervical back: Normal range of motion.  Skin:    General: Skin is warm and dry.  Neurological:     Mental Status: She is alert and oriented to person, place, and time.  Psychiatric:        Attention and Perception: Attention and perception normal.        Mood and Affect: Mood and affect normal.        Speech: Speech normal.        Behavior: Behavior normal. Behavior is cooperative.        Thought Content: Thought content normal.        Cognition and Memory: Cognition and memory normal.    Review of Systems  Constitutional: Negative.   HENT: Negative.    Eyes: Negative.   Respiratory: Negative.    Cardiovascular: Negative.   Gastrointestinal: Negative.   Genitourinary: Negative.   Musculoskeletal: Negative.   Skin: Negative.   Neurological: Negative.   Psychiatric/Behavioral: Negative.     Blood pressure (!) 126/90, pulse 86, temperature 98.6 F (37 C), temperature source Oral, resp.  rate 16, last menstrual period 06/03/2011, SpO2 98%. There is no height or weight on file to calculate BMI.  Musculoskeletal: Strength & Muscle Tone: within normal limits Gait & Station: normal Patient leans: N/A   [facility] MSE Discharge Disposition for Follow up and Recommendations: Based on my evaluation the patient does not appear to have an emergency medical condition and can be discharged with resources and follow up care in outpatient services for Medication Management and Individual Therapy Patient reviewed with attending MD, Dr Loleta Chance. Follow-up with primary care provider.  Continue current medications.  Follow-up with individual counseling and psychiatry resources provided.   Lenard Lance, FNP 07/12/2023, 3:31 PM

## 2023-07-12 NOTE — Discharge Instructions (Addendum)

## 2023-07-18 ENCOUNTER — Other Ambulatory Visit: Payer: Self-pay | Admitting: Physician Assistant

## 2023-07-18 DIAGNOSIS — F411 Generalized anxiety disorder: Secondary | ICD-10-CM

## 2023-08-02 ENCOUNTER — Ambulatory Visit: Payer: Self-pay

## 2023-08-02 NOTE — Telephone Encounter (Signed)
 Copied from CRM [id number]. Topic: Clinical - Red Word Triage >> Aug 02, 2023 10:24 AM Hobson Luna F wrote: Red Word that prompted transfer to Nurse Triage: Swelling in calf, ankles, and feet severely for two weeks.   Chief Complaint: Leg swelling  Symptoms: Swelling of bilateral legs with pain Frequency: Constant  Pertinent Negatives: Patient denies shortness of breath  Disposition: [] ED /[] Urgent Care (no appt availability in office) / [x] Appointment(In office/virtual)/ []  [place] [facility]/ [] Home Care/ [x] Refused Recommended Disposition /[] [place] [facility]/ []  Follow-up with PCP Additional Notes: Patient states that she has had swelling of her feet, ankles, and calves for the last 2 weeks with mild to moderate pain. She denies any chest pain, shortness of breath, or other symptoms at this time. She states she has beet taking her Lasix  once a day without much relief and wanted to know if she can increase the dose. Patient has declined an appointment in the office at this time. Please contact the patient to advise. Patient states it is okay to leave a message with instructions if she does not answer the phone.    Reason for Disposition  [1] MODERATE leg swelling (e.g., swelling extends up to knees) AND [2] new-onset or worsening  Answer Assessment - Initial Assessment Questions 1. ONSET: "When did the swelling start?" (e.g., minutes, hours, days)     2 weeks  2. LOCATION: "What part of the leg is swollen?"  "Are both legs swollen or just one leg?"     Bilateral legs up to calf  3. SEVERITY: "How bad is the swelling?" (e.g., localized; mild, moderate, severe)   - Localized: Small area of swelling localized to one leg.   - MILD pedal edema: Swelling limited to foot and ankle, pitting edema < 1/4 inch (6 mm) deep, rest and elevation eliminate most or all swelling.   - MODERATE edema: Swelling of lower leg to knee, pitting edema > 1/4 inch (6 mm) deep, rest and elevation only partially  reduce swelling.   - SEVERE edema: Swelling extends above knee, facial or hand swelling present.      Moderate  4. REDNESS: "Does the swelling look red or infected?"     Redness to top of feet and bottom of leg  5. PAIN: "Is the swelling painful to touch?" If Yes, ask: "How painful is it?"   (Scale 1-10; mild, moderate or severe)     Moderate  6. FEVER: "Do you have a fever?" If Yes, ask: "What is it, how was it measured, and when did it start?"      No 9. RECURRENT SYMPTOM: "Have you had leg swelling before?" If Yes, ask: "When was the last time?" "What happened that time?"     Yes, takes Lasix   10. OTHER SYMPTOMS: "Do you have any other symptoms?" (e.g., chest pain, difficulty breathing)       No  Protocols used: Leg Swelling and Edema-A-AH

## 2023-08-02 NOTE — Telephone Encounter (Signed)
 Recommend if she is having that much swelling which is new to her she needs to be evaluated and have labwork done before changing lasix  dose --- can be seen at urgent care if not having chest pain or shortness of breath

## 2023-08-02 NOTE — Telephone Encounter (Signed)
 Patient Made Aware, Verbalized Understanding. Patient stated that she believe her swelling is coming from her back being messed up. But she will go to a urgent care and call back Monday to follow up with her provider

## 2023-08-12 ENCOUNTER — Other Ambulatory Visit: Payer: Self-pay | Admitting: Family Medicine

## 2023-08-12 ENCOUNTER — Other Ambulatory Visit: Payer: Self-pay | Admitting: Physician Assistant

## 2023-08-12 ENCOUNTER — Ambulatory Visit (INDEPENDENT_AMBULATORY_CARE_PROVIDER_SITE_OTHER)

## 2023-08-12 DIAGNOSIS — Z23 Encounter for immunization: Secondary | ICD-10-CM

## 2023-08-12 DIAGNOSIS — G8929 Other chronic pain: Secondary | ICD-10-CM

## 2023-08-12 NOTE — Progress Notes (Unsigned)
 Patient is in office today for a nurse visit for Immunization and Shingrix . Patient Injection was given in the  Left deltoid. Patient tolerated injection well.

## 2023-08-19 DIAGNOSIS — M25552 Pain in left hip: Secondary | ICD-10-CM | POA: Diagnosis not present

## 2023-08-19 DIAGNOSIS — M47817 Spondylosis without myelopathy or radiculopathy, lumbosacral region: Secondary | ICD-10-CM | POA: Diagnosis not present

## 2023-08-19 DIAGNOSIS — M5030 Other cervical disc degeneration, unspecified cervical region: Secondary | ICD-10-CM | POA: Diagnosis not present

## 2023-08-19 DIAGNOSIS — M160 Bilateral primary osteoarthritis of hip: Secondary | ICD-10-CM | POA: Insufficient documentation

## 2023-08-19 DIAGNOSIS — M5137 Other intervertebral disc degeneration, lumbosacral region with discogenic back pain only: Secondary | ICD-10-CM | POA: Diagnosis not present

## 2023-08-19 DIAGNOSIS — R251 Tremor, unspecified: Secondary | ICD-10-CM | POA: Diagnosis not present

## 2023-08-19 DIAGNOSIS — M47812 Spondylosis without myelopathy or radiculopathy, cervical region: Secondary | ICD-10-CM | POA: Diagnosis not present

## 2023-08-19 DIAGNOSIS — R52 Pain, unspecified: Secondary | ICD-10-CM | POA: Insufficient documentation

## 2023-08-22 ENCOUNTER — Other Ambulatory Visit: Payer: Self-pay | Admitting: Family Medicine

## 2023-08-22 DIAGNOSIS — F411 Generalized anxiety disorder: Secondary | ICD-10-CM

## 2023-09-02 ENCOUNTER — Other Ambulatory Visit: Payer: Self-pay | Admitting: Physician Assistant

## 2023-09-02 DIAGNOSIS — K219 Gastro-esophageal reflux disease without esophagitis: Secondary | ICD-10-CM

## 2023-09-10 DIAGNOSIS — M160 Bilateral primary osteoarthritis of hip: Secondary | ICD-10-CM | POA: Diagnosis not present

## 2023-09-16 ENCOUNTER — Other Ambulatory Visit: Payer: Self-pay | Admitting: Physician Assistant

## 2023-09-16 DIAGNOSIS — F418 Other specified anxiety disorders: Secondary | ICD-10-CM

## 2023-09-20 ENCOUNTER — Other Ambulatory Visit: Payer: Self-pay | Admitting: Physician Assistant

## 2023-09-20 DIAGNOSIS — F411 Generalized anxiety disorder: Secondary | ICD-10-CM

## 2023-09-26 ENCOUNTER — Other Ambulatory Visit: Payer: Self-pay | Admitting: Physician Assistant

## 2023-09-26 DIAGNOSIS — G8929 Other chronic pain: Secondary | ICD-10-CM

## 2023-09-30 ENCOUNTER — Other Ambulatory Visit: Payer: Self-pay | Admitting: Physician Assistant

## 2023-09-30 DIAGNOSIS — R609 Edema, unspecified: Secondary | ICD-10-CM | POA: Diagnosis not present

## 2023-09-30 DIAGNOSIS — S40022A Contusion of left upper arm, initial encounter: Secondary | ICD-10-CM | POA: Diagnosis not present

## 2023-09-30 DIAGNOSIS — F411 Generalized anxiety disorder: Secondary | ICD-10-CM

## 2023-09-30 DIAGNOSIS — M7981 Nontraumatic hematoma of soft tissue: Secondary | ICD-10-CM | POA: Diagnosis not present

## 2023-10-10 ENCOUNTER — Ambulatory Visit

## 2023-10-15 ENCOUNTER — Ambulatory Visit (INDEPENDENT_AMBULATORY_CARE_PROVIDER_SITE_OTHER): Admitting: Physician Assistant

## 2023-10-15 ENCOUNTER — Other Ambulatory Visit: Payer: Self-pay | Admitting: Physician Assistant

## 2023-10-15 ENCOUNTER — Encounter: Payer: Self-pay | Admitting: Physician Assistant

## 2023-10-15 VITALS — BP 104/68 | HR 79 | Temp 97.5°F | Ht 61.0 in | Wt 154.8 lb

## 2023-10-15 DIAGNOSIS — I700 Atherosclerosis of aorta: Secondary | ICD-10-CM

## 2023-10-15 DIAGNOSIS — E559 Vitamin D deficiency, unspecified: Secondary | ICD-10-CM

## 2023-10-15 DIAGNOSIS — J438 Other emphysema: Secondary | ICD-10-CM

## 2023-10-15 DIAGNOSIS — I10 Essential (primary) hypertension: Secondary | ICD-10-CM | POA: Diagnosis not present

## 2023-10-15 DIAGNOSIS — E782 Mixed hyperlipidemia: Secondary | ICD-10-CM

## 2023-10-15 DIAGNOSIS — F418 Other specified anxiety disorders: Secondary | ICD-10-CM | POA: Insufficient documentation

## 2023-10-15 DIAGNOSIS — G8929 Other chronic pain: Secondary | ICD-10-CM | POA: Diagnosis not present

## 2023-10-15 DIAGNOSIS — M5442 Lumbago with sciatica, left side: Secondary | ICD-10-CM

## 2023-10-15 DIAGNOSIS — R5383 Other fatigue: Secondary | ICD-10-CM

## 2023-10-15 DIAGNOSIS — D508 Other iron deficiency anemias: Secondary | ICD-10-CM

## 2023-10-15 NOTE — Progress Notes (Signed)
 Established Patient Office Visit  Subjective:  Patient ID: Kaitlyn Diaz, female    DOB: 1965/11/13  Age: 58 y.o. MRN: [id number]  CC:  Chief Complaint  Patient presents with   Chronic back pain Chronic follow up           HPI DAMARIS ABELN presents for hypertension     Pt presents for follow up of hypertension.  She is tolerating the medication well without side effects.  Compliance with treatment has been good; she takes her medication as directed, maintains her diet and exercise regimen, and follows up as directed.  She is taking lisinopril  10mg  qd and norvasc  10mg  qd She denies chest pain or dyspnea    Follow up of generalized anxiety disorder.  pt is doing well on her xanax , cymbalta  , buspar  and wellbutrin  - currently doing well on medication and voices no concerns or problems  Pt presents with hyperlipidemia.  Compliance with treatment has been good; she maintains her low cholesterol diet, follows up as directed, and maintains her exercise regimen.  She denies experiencing any hypercholesterolemia related symptoms.  She is currently taking fenofibrate  and crestor  20 mg qd   Pt with history of COPD - she is still smoking - she tried chantix  but was not able to tolerate medication She uses albuterol  prn and breo every day States she is not having any breathing issues at this time  Pt with GERD - stable on prilosec 40mg  qd  Pt with history of Vit D def - is not taking supplement at this time - due for labwork  Pt with history of chronic low back pain - she had surgery many years ago for a fusion of L5/S1 -- she has a diagnosis now of DJD and severe stenosis at L4-L5 - She is now following Dr Maryelizabeth in [facility] and recently had injection therapy -  She is taking lyrica  , mobic  and robaxin  pt has had cardiac workup including cardiac cath which showed nonobstructive CAD and is now taking aspirin  81mg  qd and increased crestor  to 20mg  qd --- lifestyle modifications  recommended as well  Pt with iron  def anemia and due for colonoscopy - we referred her to GI in [place] after last visit - she has not made appt - she is given number to reach out to schedule Of note she also has an enlarged liver that was noted on prior imaging Past Medical History:  Diagnosis Date   Abnormal bruising 11/06/2022   Abnormal EKG 12/27/2022   Abnormal mammogram 12/10/2013   Absolute anemia 05/30/2022   Acquired hallux rigidus of left foot 06/09/2021   Acute laryngopharyngitis 07/13/2020   Anemia    Anxiety    panic attacks, claustophobic on xanax    Aortic atherosclerosis (HCC) 09/03/2021   Arthritis    Arthritis of carpometacarpal joint 07/09/2014   Asthma    Benign essential hypertension 12/10/2013   Bilateral hand numbness 10/08/2022   Brachial neuritis or radiculitis 12/10/2013   Bunion of right foot 06/09/2021   CAD, elevated calcium  score 439, severe proximal to mid LAD lesion, difficult to assess given calcification. 01/25/2023   Cervical radiculopathy 12/10/2013   Chest pain of uncertain etiology 12/27/2022   Chronic left-sided low back pain with left-sided sciatica 11/15/2020   Complication of anesthesia    Difficult to put to sleep, Has run fever, runs in famly   Compression fracture of L1 lumbar vertebra, closed, initial encounter (HCC) 01/04/2023   Contusion of left leg 11/08/2022  COPD (chronic obstructive pulmonary disease) (HCC)    DDD (degenerative disc disease), cervical 12/10/2013   DDD (degenerative disc disease), lumbosacral 12/10/2013   Depressive disorder 05/06/2013   STORY: cont INderal LA for one week, then switch to Inderal 40mg  1 a day   for 2 weeks, then stop.     Displacement of lumbar intervertebral disc without myelopathy 01/27/2013   Enlarged heart    hx of   Esophageal reflux 12/10/2013   Essential (primary) hypertension    Essential tremor 05/06/2013   Formatting of this note might be different from the original.   STORY: due to depression on Inderal will taper it and try topirimate,  ADR discussed.  Will not use primidone due to risk of sedation.     Family history of anesthesia complication    malignant hyperthermia on mother's side of family   Female stress incontinence 12/10/2013   Generalized anxiety disorder    GERD (gastroesophageal reflux disease)    H/O hiatal hernia    hx of   Hematochezia 12/10/2013   Hidradenitis 12/10/2013   History of melanoma    Hypertension    Dr. Rexene Crazier, medical physician [phone]   Low back pain 12/10/2013   Formatting of this note might be different from the original.  Overview:   IMO Problem List Replacer Jan. 2016  Formatting of this note might be different from the original.  IMO Problem List Replacer Jan. 2016     Lumbosacral spondylosis without myelopathy 12/10/2013   Major depressive disorder, single episode, unspecified 05/06/2013   Formatting of this note might be different from the original.  Overview:   STORY: cont INderal LA for one week, then switch to Inderal 40mg  1 a day for 2 weeks, then stop.  Formatting of this note might be different from the original.  STORY: cont INderal LA for one week, then switch to Inderal 40mg  1 a day for 2 weeks, then stop.     Malignant hyperthermia    Menopausal and postmenopausal disorder 12/10/2013   Metatarsalgia of both feet 06/09/2021   Mixed hyperlipidemia    Need for tetanus, diphtheria, and acellular pertussis (Tdap) vaccine 07/13/2020   Neuromuscular disorder (HCC)    85 % nerve damage in left leg   Other emphysema (HCC) 09/03/2021   Other fatigue 05/30/2022   Palpitations 01/07/2023   Panic disorder without agoraphobia 12/10/2013   Pilonidal cyst 12/10/2013   Pneumonia    hx of pneumonia x 2   Polyarthralgia 12/06/2015   PONV (postoperative nausea and vomiting)    Recurrent falls 01/07/2023   RLS (restless legs syndrome) 12/10/2013   Spinal headache    Spondylosis of cervical region without  myelopathy or radiculopathy 12/03/2017   Syncope and collapse 01/07/2023   Tobacco abuse 05/30/2022   Urinary tract infection    and kidney infection Hx of   Vitamin D  deficiency 09/03/2021    Past Surgical History:  Procedure Laterality Date   ANTERIOR CERVICAL DECOMP/DISCECTOMY FUSION  12/28/2011   Procedure: ANTERIOR CERVICAL DECOMPRESSION/DISCECTOMY FUSION 2 LEVELS;  Surgeon: Arley SHAUNNA Helling, MD;  Location: [facility];  Service: Neurosurgery;  Laterality: N/A;  HISTORY OF MALIGNANT HYPERTHERMIA Anterior Cervical Discectomy Fusion of Cervical five-six, Cervical six-seven   BLADDER SUSPENSION     BREAST SURGERY     left breast mass removed   hand mass     1994, multiple masses removed - all benign   LEFT HEART CATH AND CORONARY ANGIOGRAPHY N/A 02/01/2023   Procedure:  LEFT HEART CATH AND CORONARY ANGIOGRAPHY;  Surgeon: [place], Peter M, MD;  Location: [facility] INVASIVE CV LAB;  Service: Cardiovascular;  Laterality: N/A;   leg mass     left leg  surgically removed   POSTERIOR FUSION LUMBAR SPINE     L4/5   TONSILLECTOMY     and adnoids   TUBAL LIGATION      Family History  Problem Relation Age of Onset   Colon cancer Mother    Stroke Maternal Grandmother    Stomach cancer Maternal Grandfather    Breast cancer Neg Hx     Social History   Socioeconomic History   Marital status: Married    Spouse name: Not on file   Number of children: 2   Years of education: Not on file   Highest education level: GED or equivalent  Occupational History   Occupation: Disabled  Tobacco Use   Smoking status: Every Day    Average packs/day: 0.9 packs/day for 78.0 years (69.5 ttl pk-yrs)    Types: Cigarettes    Start date: 1980   Smokeless tobacco: Never  Vaping Use   Vaping status: Some Days   Substances: Nicotine , Flavoring  Substance and Sexual Activity   Alcohol use: Not Currently    Comment: Drinks occasionally, and consumes wine.   Drug use: No   Sexual activity: Not Currently   Other Topics Concern   Not on file  Social History Narrative   Not on file   Social Drivers of Health   Financial Resource Strain: Low Risk  (11/06/2022)   Overall Financial Resource Strain (CARDIA)    Difficulty of Paying Living Expenses: Not hard at all  Food Insecurity: No Food Insecurity (11/14/2022)   Hunger Vital Sign    Worried About Running Out of Food in the Last Year: Never true    Ran Out of Food in the Last Year: Never true  Transportation Needs: No Transportation Needs (11/14/2022)   PRAPARE - Administrator, Civil Service (Medical): No    Lack of Transportation (Non-Medical): No  Physical Activity: Inactive (11/06/2022)   Exercise Vital Sign    Days of Exercise per Week: 0 days    Minutes of Exercise per Session: 0 min  Stress: No Stress Concern Present (11/06/2022)   [employer] of Occupational Health - Occupational Stress Questionnaire    Feeling of Stress : Not at all  Social Connections: Unknown (01/04/2023)   [facility]   Social Network    Social Network: Not on file  Recent Concern: Social Connections - Moderately Isolated (11/06/2022)   Social Connection and Isolation Panel    Frequency of Communication with Friends and Family: More than three times a week    Frequency of Social Gatherings with Friends and Family: More than three times a week    Attends Religious Services: Never    Database administrator or Organizations: No    Attends Banker Meetings: Never    Marital Status: Married  Catering manager Violence: Not At Risk (01/04/2023)   Received from [facility]   HITS    Over the last 12 months how often did your partner physically hurt you?: Never    Over the last 12 months how often did your partner insult you or talk down to you?: Never    Over the last 12 months how often did your partner threaten you with physical harm?: Never    Over the last 12 months how  often did your partner scream or curse at you?:  Never     Current Outpatient Medications:    albuterol  (VENTOLIN  HFA) 108 (90 Base) MCG/ACT inhaler, Inhale 1 puff into the lungs 3 (three) times daily as needed. For shortness of breath, Disp: 1 each, Rfl: 3   ALPRAZolam  (XANAX ) 1 MG tablet, TAKE 1 TABLET BY MOUTH 3 TIMES DAILY AS NEEDED FOR ANXIETY, Disp: 90 tablet, Rfl: 0   amLODipine  (NORVASC ) 10 MG tablet, TAKE 1 TABLET BY MOUTH EVERY DAY, Disp: 90 tablet, Rfl: 1   ammonium lactate (AMLACTIN) 12 % cream, Apply 1 Application topically as needed for dry skin., Disp: , Rfl:    buPROPion  (WELLBUTRIN  XL) 150 MG 24 hr tablet, TAKE 1 TABLET BY MOUTH EVERY DAY, Disp: 30 tablet, Rfl: 2   busPIRone  (BUSPAR ) 15 MG tablet, Take 1 tablet (15 mg total) by mouth 2 (two) times daily., Disp: 180 tablet, Rfl: 1   DULoxetine  (CYMBALTA ) 60 MG capsule, TAKE 1 CAPSULE BY MOUTH 2 TIMES DAILY, Disp: 180 capsule, Rfl: 0   fenofibrate  micronized (LOFIBRA) 134 MG capsule, Take 1 capsule (134 mg total) by mouth daily before breakfast. TAKE 1 CAPSULE BY MOUTH ONCE DAILY, Disp: 90 capsule, Rfl: 1   fluticasone  furoate-vilanterol (BREO ELLIPTA ) 200-25 MCG/ACT AEPB, Inhale 1 puff into the lungs daily., Disp: 1 each, Rfl: 5   folic acid  (FOLVITE ) 400 MCG tablet, Take 400 mcg by mouth daily., Disp: , Rfl:    lisinopril  (ZESTRIL ) 10 MG tablet, TAKE 1 TABLET BY MOUTH EVERY DAY, Disp: 90 tablet, Rfl: 1   meloxicam  (MOBIC ) 7.5 MG tablet, TAKE 1 TABLET BY MOUTH EVERY DAY, Disp: 90 tablet, Rfl: 0   methocarbamol (ROBAXIN) 750 MG tablet, Take 750 mg by mouth 3 (three) times daily., Disp: , Rfl:    omeprazole  (PRILOSEC) 40 MG capsule, TAKE 1 CAPSULE BY MOUTH EVERY MORNING BEFORE BREAKFAST, Disp: 90 capsule, Rfl: 1   pregabalin  (LYRICA ) 200 MG capsule, TAKE 1 CAPSULE BY MOUTH THREE TIMES A DAY, Disp: 90 capsule, Rfl: 0   rosuvastatin  (CRESTOR ) 20 MG tablet, Take 1 tablet (20 mg total) by mouth daily., Disp: 90 tablet, Rfl: 3   Allergies  Allergen Reactions   Amoxicillin  Anaphylaxis, Itching and Swelling   Penicillins Itching and Swelling    Throat swelling   Codeine Other (See Comments)    nausea   Oxycodone  Other (See Comments)    Hallucinations   Oxycodone  Hcl Other (See Comments)    hallucinations   Oxycodone  Hcl    CONSTITUTIONAL: pt has had some generalized fatigue E/N/T: Negative for ear pain, nasal congestion and sore throat.  CARDIOVASCULAR: Negative for chest pain, dizziness, palpitations and pedal edema.  RESPIRATORY: Negative for recent cough and dyspnea.  GASTROINTESTINAL: Negative for abdominal pain, acid reflux symptoms, constipation, diarrhea, nausea and vomiting.  MSK: Negative for arthralgias and myalgias.  INTEGUMENTARY: Negative for rash.  NEUROLOGICAL: Negative for dizziness and headaches.  PSYCHIATRIC: Negative for sleep disturbance and to question depression screen.  Negative for depression, negative for anhedonia.        Objective:  PHYSICAL EXAM:   VS: BP 104/68   Pulse 79   Temp (!) 97.5 F (36.4 C)   Ht 5' 1 (1.549 m)   Wt 154 lb 12.8 oz (70.2 kg)   LMP 06/03/2011   SpO2 98%   BMI 29.25 kg/m   GEN: Well nourished, well developed, in no acute distress   Cardiac: RRR; no murmurs, rubs, or gallops,no edema -  MS: no deformity or atrophy  Skin: warm and dry, no rash  Neuro:  Alert and Oriented x 3,  - CN II-Xii grossly intact Psych: euthymic mood, appropriate affect and demeanor   Health Maintenance Due  Topic Date Due   Medicare Annual Wellness (AWV)  Never done    There are no preventive care reminders to display for this patient.   Lab Results  Component Value Date   TSH 1.460 06/11/2023   Lab Results  Component Value Date   WBC 6.8 06/11/2023   HGB 12.6 06/11/2023   HCT 39.1 06/11/2023   MCV 97 06/11/2023   PLT 274 06/11/2023   Lab Results  Component Value Date   NA 144 06/11/2023   K 5.6 (H) 06/11/2023   CO2 19 (L) 06/11/2023   GLUCOSE 87 06/11/2023   BUN 19 06/11/2023    CREATININE 1.11 (H) 06/11/2023   BILITOT <0.2 06/11/2023   ALKPHOS 79 06/11/2023   AST 13 06/11/2023   ALT 6 06/11/2023   PROT 7.2 06/11/2023   ALBUMIN 4.8 06/11/2023   CALCIUM  9.8 06/11/2023   ANIONGAP 11 11/14/2022   EGFR 57 (L) 06/11/2023   Lab Results  Component Value Date   CHOL 113 06/11/2023   Lab Results  Component Value Date   HDL 40 06/11/2023   Lab Results  Component Value Date   LDLCALC 50 06/11/2023   Lab Results  Component Value Date   TRIG 127 06/11/2023   Lab Results  Component Value Date   CHOLHDL 2.8 06/11/2023   Lab Results  Component Value Date   HGBA1C 5.1 06/11/2023      Assessment & Plan:   Problem List Items Addressed This Visit       Cardiovascular and Mediastinum   Benign essential hypertension - Primary   Relevant Orders   CBC with Differential/Platelet   Comprehensive metabolic panel   TSH Continue meds as directed     Aortic atherosclerosis (HCC) Continue crestor  20mg    Continue asa   Follow up with cardiology as directed        Other   Mixed hyperlipidemia   Relevant Orders   Lipid panel Continue meds and watch diet   Depression with anxiety  Continue meds Other Visit Diagnoses     Vitamin D  deficiency       Relevant Orders   VITAMIN D  25 Hydroxy (Vit-D Deficiency, Fractures)   Other emphysema (HCC) Continue current inhalers Recommend stop smoking         Chronic low back pain with spinal stenosis Continue meds Follow up with specialist as directed      Iron  def anemia Enlarged liver   Labwork pending Pt to reach out to GI for appt      Fatigue Labwork pending                  No orders of the defined types were placed in this encounter.   Follow-up: Return in about 4 months (around 02/15/2024) for chronic fasting follow-up and MCR well by phone with Luke.    SARA R Kamylah Manzo, PA-C

## 2023-10-16 ENCOUNTER — Ambulatory Visit: Payer: Self-pay | Admitting: Physician Assistant

## 2023-10-16 LAB — COMPREHENSIVE METABOLIC PANEL WITH GFR
ALT: 10 IU/L (ref 0–32)
AST: 14 IU/L (ref 0–40)
Albumin: 5.1 g/dL — ABNORMAL HIGH (ref 3.8–4.9)
Alkaline Phosphatase: 88 IU/L (ref 44–121)
BUN/Creatinine Ratio: 29 — ABNORMAL HIGH (ref 9–23)
BUN: 25 mg/dL — ABNORMAL HIGH (ref 6–24)
Bilirubin Total: 0.3 mg/dL (ref 0.0–1.2)
CO2: 16 mmol/L — ABNORMAL LOW (ref 20–29)
Calcium: 9.9 mg/dL (ref 8.7–10.2)
Chloride: 106 mmol/L (ref 96–106)
Creatinine, Ser: 0.86 mg/dL (ref 0.57–1.00)
Globulin, Total: 2.5 g/dL (ref 1.5–4.5)
Glucose: 112 mg/dL — ABNORMAL HIGH (ref 70–99)
Potassium: 4.1 mmol/L (ref 3.5–5.2)
Sodium: 141 mmol/L (ref 134–144)
Total Protein: 7.6 g/dL (ref 6.0–8.5)
eGFR: 78 mL/min/1.73 (ref >59)

## 2023-10-16 LAB — LIPID PANEL
Chol/HDL Ratio: 2.9 ratio (ref 0.0–4.4)
Cholesterol, Total: 129 mg/dL (ref 100–199)
HDL: 44 mg/dL (ref >39)
LDL Chol Calc (NIH): 60 mg/dL (ref 0–99)
Triglycerides: 145 mg/dL (ref 0–149)
VLDL Cholesterol Cal: 25 mg/dL (ref 5–40)

## 2023-10-16 LAB — CBC WITH DIFFERENTIAL/PLATELET
Basophils Absolute: 0 x10E3/uL (ref 0.0–0.2)
Basos: 1 %
EOS (ABSOLUTE): 0 x10E3/uL (ref 0.0–0.4)
Eos: 1 %
Hematocrit: 42.6 % (ref 34.0–46.6)
Hemoglobin: 13.4 g/dL (ref 11.1–15.9)
Immature Grans (Abs): 0 x10E3/uL (ref 0.0–0.1)
Immature Granulocytes: 0 %
Lymphocytes Absolute: 1.1 x10E3/uL (ref 0.7–3.1)
Lymphs: 18 %
MCH: 29.5 pg (ref 26.6–33.0)
MCHC: 31.5 g/dL (ref 31.5–35.7)
MCV: 94 fL (ref 79–97)
Monocytes Absolute: 0.4 x10E3/uL (ref 0.1–0.9)
Monocytes: 7 %
Neutrophils Absolute: 4.2 x10E3/uL (ref 1.4–7.0)
Neutrophils: 73 %
Platelets: 283 x10E3/uL (ref 150–450)
RBC: 4.54 x10E6/uL (ref 3.77–5.28)
RDW: 13.3 % (ref 11.7–15.4)
WBC: 5.8 x10E3/uL (ref 3.4–10.8)

## 2023-10-16 LAB — IRON,TIBC AND FERRITIN PANEL
Ferritin: 67 ng/mL (ref 15–150)
Iron Saturation: 17 % (ref 15–55)
Iron: 112 ug/dL (ref 27–159)
Total Iron Binding Capacity: 647 ug/dL (ref 250–450)
UIBC: 535 ug/dL — ABNORMAL HIGH (ref 131–425)

## 2023-10-16 LAB — B12 AND FOLATE PANEL
Folate: >20 ng/mL (ref >3.0)
Vitamin B-12: 519 pg/mL (ref 232–1245)

## 2023-10-16 LAB — VITAMIN D 25 HYDROXY (VIT D DEFICIENCY, FRACTURES): Vit D, 25-Hydroxy: 45.1 ng/mL (ref 30.0–100.0)

## 2023-10-16 LAB — TSH: TSH: 1.64 u[IU]/mL (ref 0.450–4.500)

## 2023-10-17 DIAGNOSIS — E785 Hyperlipidemia, unspecified: Secondary | ICD-10-CM | POA: Diagnosis not present

## 2023-10-17 DIAGNOSIS — E663 Overweight: Secondary | ICD-10-CM | POA: Diagnosis not present

## 2023-10-17 DIAGNOSIS — Z6828 Body mass index (BMI) 28.0-28.9, adult: Secondary | ICD-10-CM | POA: Diagnosis not present

## 2023-10-17 DIAGNOSIS — Z79899 Other long term (current) drug therapy: Secondary | ICD-10-CM | POA: Diagnosis not present

## 2023-10-17 DIAGNOSIS — R2681 Unsteadiness on feet: Secondary | ICD-10-CM | POA: Diagnosis not present

## 2023-10-17 DIAGNOSIS — Z008 Encounter for other general examination: Secondary | ICD-10-CM | POA: Diagnosis not present

## 2023-10-17 DIAGNOSIS — N1831 Chronic kidney disease, stage 3a: Secondary | ICD-10-CM | POA: Diagnosis not present

## 2023-10-17 DIAGNOSIS — F3341 Major depressive disorder, recurrent, in partial remission: Secondary | ICD-10-CM | POA: Diagnosis not present

## 2023-10-17 DIAGNOSIS — F411 Generalized anxiety disorder: Secondary | ICD-10-CM | POA: Diagnosis not present

## 2023-10-17 DIAGNOSIS — F17210 Nicotine dependence, cigarettes, uncomplicated: Secondary | ICD-10-CM | POA: Diagnosis not present

## 2023-10-17 DIAGNOSIS — J449 Chronic obstructive pulmonary disease, unspecified: Secondary | ICD-10-CM | POA: Diagnosis not present

## 2023-10-17 DIAGNOSIS — I129 Hypertensive chronic kidney disease with stage 1 through stage 4 chronic kidney disease, or unspecified chronic kidney disease: Secondary | ICD-10-CM | POA: Diagnosis not present

## 2023-10-22 ENCOUNTER — Telehealth: Payer: Self-pay

## 2023-10-22 NOTE — Telephone Encounter (Signed)
 Pt has seen [place] [facility] within the past year - she can call and make follow up appt

## 2023-10-22 NOTE — Telephone Encounter (Signed)
 Copied from CRM [id number]. Topic: Referral - Request for Referral >> Oct 22, 2023  2:11 PM Nathanel BROCKS wrote: Did the patient discuss referral with their provider in the last year? Yes (If No - schedule appointment) (If Yes - send message)  Appointment offered? No  Type of order/referral and detailed reason for visit: hemotologist  Preference of office, provider, location: [place]  If referral order, have you been seen by this specialty before? No (If Yes, this issue or another issue? When? Where?  Can we respond through MyChart? Yes   ----------------------------------------------------------------------- From previous Reason for Contact - Lab/Test Results: Reason for CRM:

## 2023-10-23 NOTE — Telephone Encounter (Signed)
 Left message for patient to return call.

## 2023-10-24 ENCOUNTER — Other Ambulatory Visit: Payer: Self-pay

## 2023-10-30 ENCOUNTER — Ambulatory Visit: Payer: Self-pay

## 2023-10-30 NOTE — Telephone Encounter (Signed)
 Called patient and schedule appointment for tomorrow.

## 2023-10-30 NOTE — Telephone Encounter (Signed)
 FYI Only or Action Required?: Action required by provider: clinical question for provider and update on patient condition.  Patient was last seen in primary care on 10/15/2023 by Nicholaus Credit, PA-C.  Called Nurse Triage reporting Foot Swelling.  Symptoms began several days ago.  Interventions attempted: Prescription medications: lasix  and elevation.  Symptoms are: gradually worsening.  Triage Disposition: See Physician Within 24 Hours  Patient/caregiver understands and will follow disposition?: No, wishes to speak with PCP  Copied from CRM #[id number]. Topic: Clinical - Red Word Triage >> Oct 30, 2023  3:11 PM Tiffini S wrote: [facility] that prompted transfer to Nurse Triage: Patient call back about the swelling in feet. This has been going on for about 2 weeks, she cannot walk or wear shoes. Very painful. Transferred to triage nurse. Reason for Disposition  [1] MODERATE leg swelling (e.g., swelling extends up to knees) AND [2] new-onset or getting worse  Answer Assessment - Initial Assessment Questions 1. ONSET: When did the swelling start? (e.g., minutes, hours, days)     2 weeks 2. LOCATION: What part of the leg is swollen?  Are both legs swollen or just one leg?     Legs, ankles, feet 3. SEVERITY: How bad is the swelling? (e.g., localized; mild, moderate, severe)     Swollen to calf area, states this has been ongoing and was told to increase her diuretic 4. REDNESS: Is there redness or signs of infection?     denies 5. PAIN: Is the swelling painful to touch? If Yes, ask: How painful is it?   (Scale 1-10; mild, moderate or severe)     severe 6. FEVER: Do you have a fever? If Yes, ask: What is it, how was it measured, and when did it start?      denies 7. CAUSE: What do you think is causing the leg swelling?     Does have nerve damage to feet, states that it could be the heat 9. RECURRENT SYMPTOM: Have you had leg swelling before? If Yes, ask: When was the last  time? What happened that time?     This is ongoing 10. OTHER SYMPTOMS: Do you have any other symptoms? (e.g., chest pain, difficulty breathing)       States that she did not feel right recently. Pt states that she felt awful. Denies worsening SOB.  Pt offered appt tomorrow with PCP, declined. States she was wondering if PCP could send in something for swelling and pain, states she was just there last week and wasn't seen on time, and waited for an hour. Pt states there was also not enough parking.  Protocols used: Leg Swelling and Edema-A-AH

## 2023-10-30 NOTE — Telephone Encounter (Signed)
 FYI Only or Action Required?: Action required by provider: would like to know if she should increase her water pill.  Patient was last seen in primary care on 10/15/2023 by Nicholaus Credit, PA-C.  Called Nurse Triage reporting Foot Swelling.  Symptoms began several weeks ago.  Interventions attempted: Nothing.  Symptoms are: gradually worsening.  Triage Disposition: No disposition on file.  Patient/caregiver understands and will follow disposition?:       Copied from CRM [id number]. Topic: Clinical - Red Word Triage >> Oct 30, 2023  2:57 PM Donee H wrote: Red Word that prompted transfer to Nurse Triage: Patient experiencing swelling of feet. She states swelling is going all the way up to calf. This has been going on for about 2 weeks. She also mention heart is racing and in pain. Reason for Disposition  SEVERE leg swelling (e.g., swelling extends above knee, entire leg is swollen, weeping fluid)  Answer Assessment - Initial Assessment Questions 1. ONSET: When did the swelling start? (e.g., minutes, hours, days)     Two weeks ago 2. LOCATION: What part of the leg is swollen?  Are both legs swollen or just one leg?     both 3. SEVERITY: How bad is the swelling? (e.g., localized; mild, moderate, severe)     severe 4. REDNESS: Is there redness or signs of infection?     Right leg more swollen than left, pitting edema 5. PAIN: Is the swelling painful to touch? If Yes, ask: How painful is it?   (Scale 1-10; mild, moderate or severe)     Severe in legs and feet 6. FEVER: Do you have a fever? If Yes, ask: What is it, how was it measured, and when did it start?      no 7. CAUSE: What do you think is causing the leg swelling?     no 8. MEDICAL HISTORY: Do you have a history of blood clots (e.g., DVT), cancer, heart failure, kidney disease, or liver failure?     denies 9. RECURRENT SYMPTOM: Have you had leg swelling before? If Yes, ask: When was the last time? What  happened that time?     yes 10. OTHER SYMPTOMS: Do you have any other symptoms? (e.g., chest pain, difficulty breathing)       Hx copd 11. PREGNANCY: Is there any chance you are pregnant? When was your last menstrual period?       na  Protocols used: Leg Swelling and Edema-A-AH

## 2023-10-31 ENCOUNTER — Ambulatory Visit (INDEPENDENT_AMBULATORY_CARE_PROVIDER_SITE_OTHER): Admitting: Physician Assistant

## 2023-10-31 ENCOUNTER — Encounter: Payer: Self-pay | Admitting: Physician Assistant

## 2023-10-31 VITALS — BP 140/70 | HR 103 | Temp 97.4°F | Resp 18 | Ht 61.0 in | Wt 161.0 lb

## 2023-10-31 DIAGNOSIS — R600 Localized edema: Secondary | ICD-10-CM

## 2023-10-31 MED ORDER — POTASSIUM CHLORIDE CRYS ER 10 MEQ PO TBCR
10.0000 meq | EXTENDED_RELEASE_TABLET | Freq: Every day | ORAL | 1 refills | Status: AC
Start: 2023-10-31 — End: ?

## 2023-10-31 MED ORDER — FUROSEMIDE 40 MG PO TABS: 40.0000 mg | ORAL_TABLET | Freq: Every day | ORAL | 3 refills | Status: DC

## 2023-10-31 NOTE — Progress Notes (Signed)
 Acute Office Visit  Subjective:    Patient ID: Kaitlyn Diaz, female    DOB: 04-16-1965, 58 y.o.   MRN: [id number]  Chief Complaint  Patient presents with   Foot Swelling    HPI: Patient is in today for complaints of bilateral leg and feet swelling - she states over the past month she has had constant edema She is currently on norvasc  10mg  qd and has been on that medication over 30 years and does not want to change it She is also on lisinopril  10mg  qd She denies chest pain or shortness of breath She had not been taking lasix  20mg  qd but did restart it over the past 3 weeks She has had cardiology workup and last echocardiogram was in 11/24 - Ejection fraction was 60-65% with noted mild leaking and mild impaired relaxation   Current Outpatient Medications:    albuterol  (VENTOLIN  HFA) 108 (90 Base) MCG/ACT inhaler, Inhale 1 puff into the lungs 3 (three) times daily as needed. For shortness of breath, Disp: 1 each, Rfl: 3   ALPRAZolam  (XANAX ) 1 MG tablet, TAKE 1 TABLET BY MOUTH 3 TIMES DAILY AS NEEDED FOR ANXIETY, Disp: 90 tablet, Rfl: 0   amLODipine  (NORVASC ) 10 MG tablet, TAKE 1 TABLET BY MOUTH EVERY DAY, Disp: 90 tablet, Rfl: 1   ammonium lactate (AMLACTIN) 12 % cream, Apply 1 Application topically as needed for dry skin., Disp: , Rfl:    buPROPion  (WELLBUTRIN  XL) 150 MG 24 hr tablet, TAKE 1 TABLET BY MOUTH EVERY DAY, Disp: 30 tablet, Rfl: 2   busPIRone  (BUSPAR ) 15 MG tablet, Take 1 tablet (15 mg total) by mouth 2 (two) times daily., Disp: 180 tablet, Rfl: 1   DULoxetine  (CYMBALTA ) 60 MG capsule, TAKE 1 CAPSULE BY MOUTH 2 TIMES DAILY, Disp: 180 capsule, Rfl: 0   fenofibrate  micronized (LOFIBRA) 134 MG capsule, Take 1 capsule (134 mg total) by mouth daily before breakfast. TAKE 1 CAPSULE BY MOUTH ONCE DAILY, Disp: 90 capsule, Rfl: 1   fluticasone  furoate-vilanterol (BREO ELLIPTA ) 200-25 MCG/ACT AEPB, Inhale 1 puff into the lungs daily., Disp: 1 each, Rfl: 5   folic acid  (FOLVITE ) 400  MCG tablet, Take 400 mcg by mouth daily., Disp: , Rfl:    furosemide  (LASIX ) 40 MG tablet, Take 1 tablet (40 mg total) by mouth daily., Disp: 30 tablet, Rfl: 3   lisinopril  (ZESTRIL ) 10 MG tablet, TAKE 1 TABLET BY MOUTH EVERY DAY, Disp: 90 tablet, Rfl: 1   meloxicam  (MOBIC ) 7.5 MG tablet, TAKE 1 TABLET BY MOUTH EVERY DAY, Disp: 90 tablet, Rfl: 0   methocarbamol (ROBAXIN) 750 MG tablet, Take 750 mg by mouth 3 (three) times daily., Disp: , Rfl:    omeprazole  (PRILOSEC) 40 MG capsule, TAKE 1 CAPSULE BY MOUTH EVERY MORNING BEFORE BREAKFAST, Disp: 90 capsule, Rfl: 1   potassium chloride  (KLOR-CON  M) 10 MEQ tablet, Take 1 tablet (10 mEq total) by mouth daily., Disp: 30 tablet, Rfl: 1   pregabalin  (LYRICA ) 200 MG capsule, TAKE 1 CAPSULE BY MOUTH THREE TIMES A DAY, Disp: 90 capsule, Rfl: 0   rosuvastatin  (CRESTOR ) 20 MG tablet, TAKE 1 TABLET BY MOUTH EVERY DAY, Disp: 90 tablet, Rfl: 3  Allergies  Allergen Reactions   Amoxicillin Anaphylaxis, Itching and Swelling   Penicillins Itching and Swelling    Throat swelling   Codeine Other (See Comments)    nausea   Oxycodone  Other (See Comments)    Hallucinations   Oxycodone  Hcl Other (See Comments)    hallucinations  Oxycodone  Hcl     ROS CONSTITUTIONAL: Negative for chills, fatigue, fever,   CARDIOVASCULAR: Negative for chest pain, dizziness, palpitations a RESPIRATORY: Negative for recent cough and dyspnea.  GASTROINTESTINAL: Negative for abdominal pain, acid reflux symptoms, constipation, diarrhea, nausea and vomiting.       Objective:    PHYSICAL EXAM:   BP (!) 140/70   Pulse (!) 103   Temp (!) 97.4 F (36.3 C)   Resp 18   Ht 5' 1 (1.549 m)   Wt 161 lb (73 kg)   LMP 06/03/2011   SpO2 97%   BMI 30.42 kg/m    GEN: Well nourished, well developed, in no acute distress   Cardiac: RRR; no murmurs, rubs, or gallops, - 2+ nonpitting edema Respiratory:  normal respiratory rate and pattern with no distress - normal breath sounds  with no rales, rhonchi, wheezes or rubs  Skin: warm and dry, no rash   Psych: euthymic mood, appropriate affect and demeanor     Assessment & Plan:    Peripheral edema -     Comprehensive metabolic panel with GFR -     Brain natriuretic peptide -     Potassium Chloride  Crys ER; Take 1 tablet (10 mEq total) by mouth daily.  Dispense: 30 tablet; Refill: 1 -     Furosemide ; Take 1 tablet (40 mg total) by mouth daily.  Dispense: 30 tablet; Refill: 3     Follow-up: Return in about 4 weeks (around 11/28/2023) for follow-up.  An After Visit Summary was printed and given to the patient.  Kaitlyn Diaz [facility] [phone]

## 2023-11-05 ENCOUNTER — Ambulatory Visit: Payer: Self-pay | Admitting: Physician Assistant

## 2023-11-05 ENCOUNTER — Other Ambulatory Visit: Payer: Self-pay | Admitting: Physician Assistant

## 2023-11-05 DIAGNOSIS — J438 Other emphysema: Secondary | ICD-10-CM

## 2023-11-05 LAB — COMPREHENSIVE METABOLIC PANEL WITH GFR
ALT: 7 IU/L (ref 0–32)
AST: 12 IU/L (ref 0–40)
Albumin: 4.6 g/dL (ref 3.8–4.9)
Alkaline Phosphatase: 77 IU/L (ref 44–121)
BUN/Creatinine Ratio: 21 (ref 9–23)
BUN: 18 mg/dL (ref 6–24)
Bilirubin Total: <0.2 mg/dL (ref 0.0–1.2)
CO2: 17 mmol/L — ABNORMAL LOW (ref 20–29)
Calcium: 9.5 mg/dL (ref 8.7–10.2)
Chloride: 103 mmol/L (ref 96–106)
Creatinine, Ser: 0.84 mg/dL (ref 0.57–1.00)
Globulin, Total: 2.6 g/dL (ref 1.5–4.5)
Glucose: 105 mg/dL — ABNORMAL HIGH (ref 70–99)
Potassium: 3.9 mmol/L (ref 3.5–5.2)
Sodium: 142 mmol/L (ref 134–144)
Total Protein: 7.2 g/dL (ref 6.0–8.5)
eGFR: 80 mL/min/1.73 (ref >59)

## 2023-11-05 LAB — BRAIN NATRIURETIC PEPTIDE: BNP: 71.6 pg/mL (ref 0.0–100.0)

## 2023-11-13 ENCOUNTER — Other Ambulatory Visit: Payer: Self-pay | Admitting: Family Medicine

## 2023-11-13 ENCOUNTER — Other Ambulatory Visit: Payer: Self-pay | Admitting: Physician Assistant

## 2023-11-13 DIAGNOSIS — F411 Generalized anxiety disorder: Secondary | ICD-10-CM

## 2023-11-18 ENCOUNTER — Other Ambulatory Visit: Payer: Self-pay | Admitting: Family Medicine

## 2023-11-18 ENCOUNTER — Other Ambulatory Visit: Payer: Self-pay | Admitting: Physician Assistant

## 2023-11-18 DIAGNOSIS — I10 Essential (primary) hypertension: Secondary | ICD-10-CM

## 2023-11-18 DIAGNOSIS — F411 Generalized anxiety disorder: Secondary | ICD-10-CM

## 2023-11-22 ENCOUNTER — Other Ambulatory Visit: Payer: Self-pay | Admitting: Physician Assistant

## 2023-11-22 DIAGNOSIS — I10 Essential (primary) hypertension: Secondary | ICD-10-CM

## 2023-11-27 ENCOUNTER — Other Ambulatory Visit: Payer: Self-pay | Admitting: Physician Assistant

## 2023-11-27 ENCOUNTER — Ambulatory Visit: Payer: Self-pay

## 2023-11-27 DIAGNOSIS — F411 Generalized anxiety disorder: Secondary | ICD-10-CM

## 2023-11-27 DIAGNOSIS — Z1231 Encounter for screening mammogram for malignant neoplasm of breast: Secondary | ICD-10-CM

## 2023-11-27 DIAGNOSIS — I10 Essential (primary) hypertension: Secondary | ICD-10-CM

## 2023-11-27 DIAGNOSIS — J438 Other emphysema: Secondary | ICD-10-CM

## 2023-11-27 MED ORDER — LISINOPRIL 10 MG PO TABS: 10.0000 mg | ORAL_TABLET | Freq: Every day | ORAL | 1 refills | Status: DC

## 2023-11-27 MED ORDER — MELOXICAM 7.5 MG PO TABS: 7.5000 mg | ORAL_TABLET | Freq: Every day | ORAL | 1 refills | Status: DC

## 2023-11-27 MED ORDER — FLUTICASONE FUROATE-VILANTEROL 200-25 MCG/ACT IN AEPB: 1.0000 | INHALATION_SPRAY | Freq: Every day | RESPIRATORY_TRACT | 1 refills | Status: DC

## 2023-11-27 MED ORDER — HYDROXYZINE PAMOATE 25 MG PO CAPS: 25.0000 mg | ORAL_CAPSULE | Freq: Two times a day (BID) | ORAL | 0 refills | Status: DC | PRN

## 2023-11-27 NOTE — Telephone Encounter (Signed)
 Pt notified will send in hydroxyzine  to take as needed for anxiety

## 2023-11-27 NOTE — Telephone Encounter (Signed)
 Patient made aware of information stated that she was worried about having withdrawals from her Alprazolam , explain to patient that medication can not be filled early by law. Patient Made Aware, Verbalized Understanding.

## 2023-11-27 NOTE — Telephone Encounter (Signed)
 Pt states that she slipped and fell in the bathroom when she was grabbing her muscle relaxer, pt states she is calling d/t needing refills for meds that fell in the toilet. Pt states she did not call for the fall, states she called for med refills. Pt denies hitting her head, states that she has a cut on her arm but bleeding is controlled, fall was mechanical. Rx refills were requested for meds that fell in the toilet. Pt would like call back from clinic staff.   Copied from CRM [id number]. Topic: Clinical - Red Word Triage >> Nov 27, 2023 12:11 PM [name] wrote: Red Word that prompted transfer to Nurse Triage:  Kaitlyn Diaz fell in her bathroom today. Her medications fell in toilet.She cut her right arm and leg. It did bleed and now just a scratch. She lost her balance.

## 2023-11-27 NOTE — Telephone Encounter (Signed)
 I can send in lisinopril  , meloxicam  and inhaler but these will be out of pocket cost for her I do not prescribe the muscle relaxant robaxin -- our office has not filled this and is probably prescribed from her back doctor With xanax  being a controlled substance this will not be able to be filled early

## 2023-12-04 ENCOUNTER — Ambulatory Visit: Admitting: Physician Assistant

## 2023-12-05 ENCOUNTER — Ambulatory Visit

## 2023-12-06 ENCOUNTER — Encounter

## 2023-12-16 ENCOUNTER — Other Ambulatory Visit: Payer: Self-pay | Admitting: Physician Assistant

## 2023-12-16 DIAGNOSIS — E782 Mixed hyperlipidemia: Secondary | ICD-10-CM

## 2023-12-16 DIAGNOSIS — I10 Essential (primary) hypertension: Secondary | ICD-10-CM

## 2023-12-16 DIAGNOSIS — F418 Other specified anxiety disorders: Secondary | ICD-10-CM

## 2023-12-16 DIAGNOSIS — J438 Other emphysema: Secondary | ICD-10-CM

## 2023-12-16 DIAGNOSIS — F411 Generalized anxiety disorder: Secondary | ICD-10-CM

## 2023-12-16 DIAGNOSIS — G8929 Other chronic pain: Secondary | ICD-10-CM

## 2023-12-16 MED ORDER — FLUTICASONE FUROATE-VILANTEROL 200-25 MCG/ACT IN AEPB: 1.0000 | INHALATION_SPRAY | Freq: Every day | RESPIRATORY_TRACT | 0 refills | Status: DC

## 2023-12-16 MED ORDER — AMLODIPINE BESYLATE 10 MG PO TABS: 10.0000 mg | ORAL_TABLET | Freq: Every day | ORAL | 0 refills | Status: DC

## 2023-12-16 MED ORDER — LISINOPRIL 10 MG PO TABS: 10.0000 mg | ORAL_TABLET | Freq: Every day | ORAL | 0 refills | Status: DC

## 2023-12-16 MED ORDER — DULOXETINE HCL 60 MG PO CPEP: 60.0000 mg | ORAL_CAPSULE | Freq: Two times a day (BID) | ORAL | 0 refills | Status: DC

## 2023-12-16 MED ORDER — ALPRAZOLAM 1 MG PO TABS: 1.0000 mg | ORAL_TABLET | Freq: Three times a day (TID) | ORAL | 0 refills | Status: DC | PRN

## 2023-12-16 MED ORDER — BUSPIRONE HCL 15 MG PO TABS: 15.0000 mg | ORAL_TABLET | Freq: Two times a day (BID) | ORAL | 0 refills | Status: DC

## 2023-12-16 MED ORDER — MELOXICAM 7.5 MG PO TABS: 7.5000 mg | ORAL_TABLET | Freq: Every day | ORAL | 0 refills | Status: DC

## 2023-12-16 MED ORDER — ROSUVASTATIN CALCIUM 20 MG PO TABS: 20.0000 mg | ORAL_TABLET | Freq: Every day | ORAL | 0 refills | Status: DC

## 2023-12-16 MED ORDER — FENOFIBRATE MICRONIZED 134 MG PO CAPS: 134.0000 mg | ORAL_CAPSULE | Freq: Every day | ORAL | 0 refills | Status: DC

## 2023-12-16 MED ORDER — PREGABALIN 200 MG PO CAPS: ORAL_CAPSULE | ORAL | 0 refills | Status: DC

## 2023-12-16 MED ORDER — BUPROPION HCL ER (XL) 150 MG PO TB24: 150.0000 mg | ORAL_TABLET | Freq: Every day | ORAL | 0 refills | Status: DC

## 2023-12-16 NOTE — Telephone Encounter (Signed)
 Copied from CRM [id number]. Topic: Clinical - Prescription Issue >> Dec 16, 2023 10:54 AM Debby BROCKS wrote: Reason for CRM: Patient moved and would like to make sure any current refills she has at the pharmacy left get transferred from: [facility], [place] - [address] To: [id number] - [place], [place] - [address] [[id number]]

## 2023-12-16 NOTE — Telephone Encounter (Signed)
 Copied from CRM [id number]. Topic: Clinical - Medication Refill >> Dec 16, 2023 10:49 AM Debby BROCKS wrote: Medication:  ALPRAZolam  (XANAX ) 1 MG tablet  amLODipine  (NORVASC ) 10 MG tablet buPROPion  (WELLBUTRIN  XL) 150 MG 24 hr tablet busPIRone  (BUSPAR ) 15 MG tablet DULoxetine  (CYMBALTA ) 60 MG capsule fenofibrate  micronized (LOFIBRA) 134 MG capsule fluticasone  furoate-vilanterol (BREO ELLIPTA ) 200-25 MCG/ACT AEPB pregabalin  (LYRICA ) 200 MG capsule rosuvastatin  (CRESTOR ) 20 MG tablet omeprazole  (PRILOSEC) 40 MG capsule meloxicam  (MOBIC ) 7.5 MG tablet  meloxicam  (MOBIC ) 7.5 MG tablet  lisinopril  (ZESTRIL ) 10 MG tablet   Has the patient contacted their pharmacy? No (Agent: If no, request that the patient contact the pharmacy for the refill. If patient does not wish to contact the pharmacy document the reason why and proceed with request.) (Agent: If yes, when and what did the pharmacy advise?)  Patient has moved farther away and would like to refill her meds to make sure she has ample time to find a new provider than can do the refills for her  This is the patient's preferred pharmacy:  [facility] [address] [place] - [address] [place] [address] Phone: [phone] Fax: [phone]  Is this the correct pharmacy for this prescription? Yes If no, delete pharmacy and type the correct one.   Has the prescription been filled recently? No  Is the patient out of the medication? Yes  Has the patient been seen for an appointment in the last year OR does the patient have an upcoming appointment? Yes  Can we respond through MyChart? Yes  Agent: Please be advised that Rx refills may take up to 3 business days. We ask that you follow-up with your pharmacy.

## 2023-12-17 ENCOUNTER — Encounter: Payer: Self-pay | Admitting: Physician Assistant

## 2023-12-17 ENCOUNTER — Other Ambulatory Visit: Payer: Self-pay | Admitting: Physician Assistant

## 2023-12-17 DIAGNOSIS — E782 Mixed hyperlipidemia: Secondary | ICD-10-CM

## 2023-12-17 DIAGNOSIS — F418 Other specified anxiety disorders: Secondary | ICD-10-CM

## 2023-12-17 DIAGNOSIS — I10 Essential (primary) hypertension: Secondary | ICD-10-CM

## 2023-12-17 DIAGNOSIS — G8929 Other chronic pain: Secondary | ICD-10-CM

## 2023-12-17 DIAGNOSIS — F411 Generalized anxiety disorder: Secondary | ICD-10-CM

## 2023-12-17 DIAGNOSIS — J438 Other emphysema: Secondary | ICD-10-CM

## 2023-12-17 MED ORDER — BUSPIRONE HCL 15 MG PO TABS: 15.0000 mg | ORAL_TABLET | Freq: Two times a day (BID) | ORAL | 0 refills | Status: AC

## 2023-12-17 MED ORDER — PREGABALIN 200 MG PO CAPS: ORAL_CAPSULE | ORAL | 0 refills | Status: DC

## 2023-12-17 MED ORDER — FENOFIBRATE MICRONIZED 134 MG PO CAPS
134.0000 mg | ORAL_CAPSULE | Freq: Every day | ORAL | 0 refills | Status: AC
Start: 2023-12-17 — End: ?

## 2023-12-17 MED ORDER — AMLODIPINE BESYLATE 10 MG PO TABS: 10.0000 mg | ORAL_TABLET | Freq: Every day | ORAL | 0 refills | Status: AC

## 2023-12-17 MED ORDER — BUPROPION HCL ER (XL) 150 MG PO TB24: 150.0000 mg | ORAL_TABLET | Freq: Every day | ORAL | 0 refills | Status: AC

## 2023-12-17 MED ORDER — LISINOPRIL 10 MG PO TABS: 10.0000 mg | ORAL_TABLET | Freq: Every day | ORAL | 0 refills | Status: DC

## 2023-12-17 MED ORDER — MELOXICAM 7.5 MG PO TABS: 7.5000 mg | ORAL_TABLET | Freq: Every day | ORAL | 0 refills | Status: DC

## 2023-12-17 MED ORDER — FLUTICASONE FUROATE-VILANTEROL 200-25 MCG/ACT IN AEPB: 1.0000 | INHALATION_SPRAY | Freq: Every day | RESPIRATORY_TRACT | 0 refills | Status: DC

## 2023-12-17 MED ORDER — ROSUVASTATIN CALCIUM 20 MG PO TABS: 20.0000 mg | ORAL_TABLET | Freq: Every day | ORAL | 0 refills | Status: AC

## 2023-12-17 MED ORDER — DULOXETINE HCL 60 MG PO CPEP: 60.0000 mg | ORAL_CAPSULE | Freq: Two times a day (BID) | ORAL | 0 refills | Status: AC

## 2023-12-17 MED ORDER — ALPRAZOLAM 1 MG PO TABS: 1.0000 mg | ORAL_TABLET | Freq: Three times a day (TID) | ORAL | 0 refills | Status: DC | PRN

## 2024-01-20 ENCOUNTER — Other Ambulatory Visit: Payer: Self-pay | Admitting: Physician Assistant

## 2024-01-20 DIAGNOSIS — F411 Generalized anxiety disorder: Secondary | ICD-10-CM

## 2024-01-20 DIAGNOSIS — J438 Other emphysema: Secondary | ICD-10-CM

## 2024-01-20 MED ORDER — FLUTICASONE FUROATE-VILANTEROL 200-25 MCG/ACT IN AEPB: 1.0000 | INHALATION_SPRAY | Freq: Every day | RESPIRATORY_TRACT | 0 refills | Status: DC

## 2024-01-20 MED ORDER — ALPRAZOLAM 1 MG PO TABS: 1.0000 mg | ORAL_TABLET | Freq: Three times a day (TID) | ORAL | 0 refills | Status: DC | PRN

## 2024-01-20 NOTE — Telephone Encounter (Signed)
 Copied from CRM [id number]. Topic: Clinical - Medication Refill >> Jan 20, 2024 11:52 AM Berwyn MATSU wrote: Medication: ALPRAZolam  (XANAX ) 1 MG tablet; fluticasone  furoate-vilanterol (BREO ELLIPTA ) 200-25 MCG/ACT AEPB    Has the patient contacted their pharmacy? Yes (Agent: If no, request that the patient contact the pharmacy for the refill. If patient does not wish to contact the pharmacy document the reason why and proceed with request.) (Agent: If yes, when and what did the pharmacy advise?)  This is the patient's preferred pharmacy:  [facility] - [place], [place] - [address] [place] [address] Phone: [phone] Fax: [phone]   Is this the correct pharmacy for this prescription? Yes If no, delete pharmacy and type the correct one.   Has the prescription been filled recently? Yes  Is the patient out of the medication? Yes  Has the patient been seen for an appointment in the last year OR does the patient have an upcoming appointment? Yes  Can we respond through MyChart? Yes  Agent: Please be advised that Rx refills may take up to 3 business days. We ask that you follow-up with your pharmacy.

## 2024-01-28 ENCOUNTER — Ambulatory Visit: Admitting: Physician Assistant

## 2024-01-28 ENCOUNTER — Encounter: Payer: Self-pay | Admitting: Physician Assistant

## 2024-01-28 VITALS — BP 100/66 | HR 73 | Temp 97.8°F | Resp 18 | Ht 61.0 in | Wt 159.0 lb

## 2024-01-28 DIAGNOSIS — R739 Hyperglycemia, unspecified: Secondary | ICD-10-CM | POA: Diagnosis not present

## 2024-01-28 DIAGNOSIS — D508 Other iron deficiency anemias: Secondary | ICD-10-CM | POA: Diagnosis not present

## 2024-01-28 DIAGNOSIS — Z23 Encounter for immunization: Secondary | ICD-10-CM | POA: Diagnosis not present

## 2024-01-28 DIAGNOSIS — E559 Vitamin D deficiency, unspecified: Secondary | ICD-10-CM

## 2024-01-28 DIAGNOSIS — I10 Essential (primary) hypertension: Secondary | ICD-10-CM

## 2024-01-28 DIAGNOSIS — I700 Atherosclerosis of aorta: Secondary | ICD-10-CM

## 2024-01-28 DIAGNOSIS — E782 Mixed hyperlipidemia: Secondary | ICD-10-CM | POA: Diagnosis not present

## 2024-01-28 DIAGNOSIS — J438 Other emphysema: Secondary | ICD-10-CM

## 2024-01-28 DIAGNOSIS — G8929 Other chronic pain: Secondary | ICD-10-CM

## 2024-01-28 DIAGNOSIS — F411 Generalized anxiety disorder: Secondary | ICD-10-CM

## 2024-01-28 DIAGNOSIS — M5442 Lumbago with sciatica, left side: Secondary | ICD-10-CM

## 2024-01-28 LAB — POCT LIPID PANEL
HDL: 42
LDL: 51
Non-HDL: 78
TC/HDL: 1.2
TC: 120
TRG: 134

## 2024-01-28 LAB — POCT GLYCOSYLATED HEMOGLOBIN (HGB A1C): HbA1c POC (<> result, manual entry): 5 % (ref 4.0–5.6)

## 2024-01-28 NOTE — Progress Notes (Signed)
 Established Patient Office Visit  Subjective:  Patient ID: Kaitlyn Diaz, female    DOB: 01-11-1966  Age: 58 y.o. MRN: [id number]  CC:  Chief Complaint  Patient presents with   Chronic back pain Chronic follow up           HPI BRENDALYN VALLELY presents for hypertension     Pt presents for follow up of hypertension.  She is tolerating the medication well without side effects.  Compliance with treatment has been good; she takes her medication as directed, maintains her diet and exercise regimen, and follows up as directed.  She is taking lisinopril  10mg  qd and norvasc  10mg  qd She denies chest pain or dyspnea BP today 100/66    Follow up of generalized anxiety disorder.  pt is doing well on her xanax , cymbalta  , buspar  and wellbutrin  - currently doing well on medication and voices no concerns or problems  Pt presents with hyperlipidemia.  Compliance with treatment has been good; she maintains her low cholesterol diet, follows up as directed, and maintains her exercise regimen.  She denies experiencing any hypercholesterolemia related symptoms.  She is currently taking fenofibrate  and crestor  20 mg qd   Pt with history of COPD - she is still smoking -  She uses albuterol  prn and breo every day States she is not having any breathing issues at this time She is due for lung cancer CT screening but defers at this time  Pt with GERD - stable on prilosec 40mg  qd  Pt with history of Vit D def - is not taking supplement at this time - due for labwork  Pt with history of chronic low back pain - she had surgery many years ago for a fusion of L5/S1 -- she has a diagnosis now of DJD and severe stenosis at L4-L5 - She had been following with [facility] but no longer seeing him -  She is taking lyrica  , mobic  and robaxin   pt has had cardiac workup including cardiac cath which showed nonobstructive CAD and is now taking aspirin  81mg  qd and increased crestor   to 20mg  qd --- lifestyle modifications recommended as well She is due for cardiology follow up and pt will schedule  Pt with iron  def anemia and due for colonoscopy - we have scheduled her for appts but she was not able to keep - she is now living in [place] and will call us  back with a provider she would like to see  Pt would like flu shot today Past Medical History:  Diagnosis Date   Abnormal bruising 11/06/2022   Abnormal EKG 12/27/2022   Abnormal mammogram 12/10/2013   Absolute anemia 05/30/2022   Acquired hallux rigidus of left foot 06/09/2021   Acute laryngopharyngitis 07/13/2020   Anemia    Anxiety    panic attacks, claustophobic on xanax    Aortic atherosclerosis 09/03/2021   Arthritis    Arthritis of carpometacarpal joint 07/09/2014   Asthma    Benign essential hypertension 12/10/2013   Bilateral hand numbness 10/08/2022   Brachial neuritis or radiculitis 12/10/2013   Bunion of right foot 06/09/2021   CAD, elevated calcium  score 439, severe proximal to mid LAD lesion, difficult to assess given calcification. 01/25/2023   Cervical radiculopathy 12/10/2013   Chest pain of uncertain etiology 12/27/2022   Chronic left-sided low back pain with left-sided sciatica 11/15/2020   Complication of anesthesia    Difficult to put to sleep, Has run fever, runs  in famly   Compression fracture of L1 lumbar vertebra, closed, initial encounter (HCC) 01/04/2023   Contusion of left leg 11/08/2022   COPD (chronic obstructive pulmonary disease) (HCC)    DDD (degenerative disc disease), cervical 12/10/2013   DDD (degenerative disc disease), lumbosacral 12/10/2013   Depressive disorder 05/06/2013   STORY: cont INderal LA for one week, then switch to Inderal 40mg  1 a day   for 2 weeks, then stop.     Displacement of lumbar intervertebral disc without myelopathy 01/27/2013   Enlarged heart    hx of   Esophageal reflux 12/10/2013   Essential (primary) hypertension    Essential tremor  05/06/2013   Formatting of this note might be different from the original.  STORY: due to depression on Inderal will taper it and try topirimate,  ADR discussed.  Will not use primidone due to risk of sedation.     Family history of anesthesia complication    malignant hyperthermia on mother's side of family   Female stress incontinence 12/10/2013   Generalized anxiety disorder    GERD (gastroesophageal reflux disease)    H/O hiatal hernia    hx of   Hematochezia 12/10/2013   Hidradenitis 12/10/2013   History of melanoma    Hypertension    Dr. Rexene Crazier, medical physician [phone]   Low back pain 12/10/2013   Formatting of this note might be different from the original.  Overview:   IMO Problem List Replacer Jan. 2016  Formatting of this note might be different from the original.  IMO Problem List Replacer Jan. 2016     Lumbosacral spondylosis without myelopathy 12/10/2013   Major depressive disorder, single episode, unspecified 05/06/2013   Formatting of this note might be different from the original.  Overview:   STORY: cont INderal LA for one week, then switch to Inderal 40mg  1 a day for 2 weeks, then stop.  Formatting of this note might be different from the original.  STORY: cont INderal LA for one week, then switch to Inderal 40mg  1 a day for 2 weeks, then stop.     Malignant hyperthermia    Menopausal and postmenopausal disorder 12/10/2013   Metatarsalgia of both feet 06/09/2021   Mixed hyperlipidemia    Need for tetanus, diphtheria, and acellular pertussis (Tdap) vaccine 07/13/2020   Neuromuscular disorder (HCC)    85 % nerve damage in left leg   Other emphysema (HCC) 09/03/2021   Other fatigue 05/30/2022   Palpitations 01/07/2023   Panic disorder without agoraphobia 12/10/2013   Pilonidal cyst 12/10/2013   Pneumonia    hx of pneumonia x 2   Polyarthralgia 12/06/2015   PONV (postoperative nausea and vomiting)    Recurrent falls 01/07/2023   RLS (restless legs  syndrome) 12/10/2013   Spinal headache    Spondylosis of cervical region without myelopathy or radiculopathy 12/03/2017   Syncope and collapse 01/07/2023   Tobacco abuse 05/30/2022   Urinary tract infection    and kidney infection Hx of   Vitamin D  deficiency 09/03/2021    Past Surgical History:  Procedure Laterality Date   ANTERIOR CERVICAL DECOMP/DISCECTOMY FUSION  12/28/2011   Procedure: ANTERIOR CERVICAL DECOMPRESSION/DISCECTOMY FUSION 2 LEVELS;  Surgeon: Arley SHAUNNA Helling, MD;  Location: [facility];  Service: Neurosurgery;  Laterality: N/A;  HISTORY OF MALIGNANT HYPERTHERMIA Anterior Cervical Discectomy Fusion of Cervical five-six, Cervical six-seven   BLADDER SUSPENSION     BREAST SURGERY     left breast mass removed   hand mass  1994, multiple masses removed - all benign   LEFT HEART CATH AND CORONARY ANGIOGRAPHY N/A 02/01/2023   Procedure: LEFT HEART CATH AND CORONARY ANGIOGRAPHY;  Surgeon: Swazil[place], Peter M, MD;  Location: [facility] CV [facility];  Service: Cardiovascular;  Laterality: N/A;   leg mass     left leg  surgically removed   POSTERIOR FUSION LUMBAR SPINE     L4/5   TONSILLECTOMY     and adnoids   TUBAL LIGATION      Family History  Problem Relation Age of Onset   Colon cancer Mother    Stroke Maternal Grandmother    Stomach cancer Maternal Grandfather    Breast cancer Neg Hx     Social History   Socioeconomic History   Marital status: Married    Spouse name: Not on file   Number of children: 2   Years of education: Not on file   Highest education level: GED or equivalent  Occupational History   Occupation: Disabled  Tobacco Use   Smoking status: Every Day    Average packs/day: 0.9 packs/day for 78.0 years (69.5 ttl pk-yrs)    Types: Cigarettes    Start date: 1980   Smokeless tobacco: Never  Vaping Use   Vaping status: Some Days   Substances: Nicotine , Flavoring  Substance and Sexual Activity   Alcohol use: Not Currently    Comment: Drinks  occasionally, and consumes wine.   Drug use: No   Sexual activity: Not Currently  Other Topics Concern   Not on file  Social History Narrative   Not on file   Social Drivers of Health   Financial Resource Strain: Low Risk  (11/06/2022)   Overall Financial Resource Strain (CARDIA)    Difficulty of Paying Living Expenses: Not hard at all  Food Insecurity: No Food Insecurity (11/14/2022)   Hunger Vital Sign    Worried About Running Out of Food in the Last Year: Never true    Ran Out of Food in the Last Year: Never true  Transportation Needs: No Transportation Needs (11/14/2022)   PRAPARE - Administrator, Civil Service (Medical): No    Lack of Transportation (Non-Medical): No  Physical Activity: Inactive (11/06/2022)   Exercise Vital Sign    Days of Exercise per Week: 0 days    Minutes of Exercise per Session: 0 min  Stress: No Stress Concern Present (11/06/2022)   [employer] of Occupational Health - Occupational Stress Questionnaire    Feeling of Stress : Not at all  Social Connections: Unknown (01/04/2023)   Received from [facility]   Social Network    Social Network: Not on file  Recent Concern: Social Connections - Moderately Isolated (11/06/2022)   Social Connection and Isolation Panel    Frequency of Communication with Friends and Family: More than three times a week    Frequency of Social Gatherings with Friends and Family: More than three times a week    Attends Religious Services: Never    Database administrator or Organizations: No    Attends Banker Meetings: Never    Marital Status: Married  Catering manager Violence: Not At Risk (01/04/2023)   Received from [facility]   HITS    Over the last 12 months how often did your partner physically hurt you?: Never    Over the last 12 months how often did your partner insult you or talk down to you?: Never    Over the last 12 months  how often did your partner threaten you with physical harm?:  Never    Over the last 12 months how often did your partner scream or curse at you?: Never     Current Outpatient Medications:    albuterol  (VENTOLIN  HFA) 108 (90 Base) MCG/ACT inhaler, Inhale 1 puff into the lungs 3 (three) times daily as needed. For shortness of breath, Disp: 1 each, Rfl: 3   ALPRAZolam  (XANAX ) 1 MG tablet, Take 1 tablet (1 mg total) by mouth 3 (three) times daily as needed for anxiety., Disp: 90 tablet, Rfl: 0   amLODipine  (NORVASC ) 10 MG tablet, Take 1 tablet (10 mg total) by mouth daily., Disp: 90 tablet, Rfl: 0   ammonium lactate (AMLACTIN) 12 % cream, Apply 1 Application topically as needed for dry skin., Disp: , Rfl:    aspirin  EC 81 MG tablet, Take 81 mg by mouth daily. Swallow whole., Disp: , Rfl:    buPROPion  (WELLBUTRIN  XL) 150 MG 24 hr tablet, Take 1 tablet (150 mg total) by mouth daily., Disp: 90 tablet, Rfl: 0   busPIRone  (BUSPAR ) 15 MG tablet, Take 1 tablet (15 mg total) by mouth 2 (two) times daily., Disp: 180 tablet, Rfl: 0   DULoxetine  (CYMBALTA ) 60 MG capsule, Take 1 capsule (60 mg total) by mouth 2 (two) times daily., Disp: 180 capsule, Rfl: 0   fenofibrate  micronized (LOFIBRA) 134 MG capsule, Take 1 capsule (134 mg total) by mouth daily before breakfast. TAKE 1 CAPSULE BY MOUTH ONCE DAILY, Disp: 90 capsule, Rfl: 0   fluticasone  furoate-vilanterol (BREO ELLIPTA ) 200-25 MCG/ACT AEPB, Inhale 1 puff into the lungs daily., Disp: 1 each, Rfl: 0   folic acid  (FOLVITE ) 400 MCG tablet, Take 400 mcg by mouth daily., Disp: , Rfl:    furosemide  (LASIX ) 40 MG tablet, Take 1 tablet (40 mg total) by mouth daily., Disp: 30 tablet, Rfl: 3   lisinopril  (ZESTRIL ) 10 MG tablet, Take 1 tablet (10 mg total) by mouth daily., Disp: 90 tablet, Rfl: 0   meloxicam  (MOBIC ) 7.5 MG tablet, Take 1 tablet (7.5 mg total) by mouth daily., Disp: 90 tablet, Rfl: 0   methocarbamol (ROBAXIN) 750 MG tablet, Take 750 mg by mouth 3 (three) times daily., Disp: , Rfl:    omeprazole  (PRILOSEC) 40 MG  capsule, TAKE 1 CAPSULE BY MOUTH EVERY MORNING BEFORE BREAKFAST, Disp: 90 capsule, Rfl: 1   potassium chloride  (KLOR-CON  M) 10 MEQ tablet, Take 1 tablet (10 mEq total) by mouth daily., Disp: 30 tablet, Rfl: 1   pregabalin  (LYRICA ) 200 MG capsule, TAKE 1 CAPSULE BY MOUTH THREE TIMES A DAY, Disp: 90 capsule, Rfl: 0   rosuvastatin  (CRESTOR ) 20 MG tablet, Take 1 tablet (20 mg total) by mouth daily., Disp: 90 tablet, Rfl: 0   Allergies  Allergen Reactions   Amoxicillin Anaphylaxis, Itching and Swelling   Penicillins Itching and Swelling    Throat swelling   Codeine Other (See Comments)    nausea   Oxycodone  Other (See Comments)    Hallucinations   Oxycodone  Hcl Other (See Comments)    hallucinations   Oxycodone  Hcl    CONSTITUTIONAL: Negative for chills, fatigue, fever, unintentional weight gain and unintentional weight loss.  E/N/T: Negative for ear pain, nasal congestion and sore throat.  CARDIOVASCULAR: Negative for chest pain, dizziness, palpitations and pedal edema.  RESPIRATORY: Negative for recent cough and dyspnea.  GASTROINTESTINAL: Negative for abdominal pain, acid reflux symptoms, constipation, diarrhea, nausea and vomiting.  MSK: see HPI INTEGUMENTARY: Negative for rash.  NEUROLOGICAL:  Negative for dizziness and headaches.  PSYCHIATRIC: see HPI       Objective:  PHYSICAL EXAM:   VS: BP 100/66   Pulse 73   Temp 97.8 F (36.6 C) (Temporal)   Resp 18   Ht 5' 1 (1.549 m)   Wt 159 lb (72.1 kg)   LMP 06/03/2011   SpO2 96%   BMI 30.04 kg/m   GEN: Well nourished, well developed, in no acute distress   Cardiac: RRR; no murmurs, rubs, or gallops,no edema -  Respiratory:  normal respiratory rate and pattern with no distress - normal breath sounds with no rales, rhonchi, wheezes or rubs GI: normal bowel sounds, no masses or tenderness MS: no deformity or atrophy  Skin: warm and dry, no rash  Neuro:  Alert and Oriented x 3,  - CN II-Xii grossly intact Psych: euthymic  mood, appropriate affect and demeanor  Office Visit on 01/28/2024  Component Date Value Ref Range Status   TC 01/28/2024 120   Final   HDL 01/28/2024 42   Final   TRG 01/28/2024 134   Final   LDL 01/28/2024 51   Final   Non-HDL 01/28/2024 78   Final   TC/HDL 01/28/2024 1.2   Final   HbA1c POC (<> result, manual entry) 01/28/2024 5.0  4.0 - 5.6 % Final    Health Maintenance Due  Topic Date Due   Medicare Annual Wellness (AWV)  Never done    There are no preventive care reminders to display for this patient.  Lab Results  Component Value Date   TSH 1.640 10/15/2023   Lab Results  Component Value Date   WBC 5.8 10/15/2023   HGB 13.4 10/15/2023   HCT 42.6 10/15/2023   MCV 94 10/15/2023   PLT 283 10/15/2023   Lab Results  Component Value Date   NA 142 10/31/2023   K 3.9 10/31/2023   CO2 17 (L) 10/31/2023   GLUCOSE 105 (H) 10/31/2023   BUN 18 10/31/2023   CREATININE 0.84 10/31/2023   BILITOT <0.2 10/31/2023   ALKPHOS 77 10/31/2023   AST 12 10/31/2023   ALT 7 10/31/2023   PROT 7.2 10/31/2023   ALBUMIN 4.6 10/31/2023   CALCIUM  9.5 10/31/2023   ANIONGAP 11 11/14/2022   EGFR 80 10/31/2023   Lab Results  Component Value Date   CHOL 129 10/15/2023   Lab Results  Component Value Date   HDL 44 10/15/2023   Lab Results  Component Value Date   LDLCALC 60 10/15/2023   Lab Results  Component Value Date   TRIG 145 10/15/2023   Lab Results  Component Value Date   CHOLHDL 2.9 10/15/2023   Lab Results  Component Value Date   HGBA1C 5.0 01/28/2024      Assessment & Plan:   Problem List Items Addressed This Visit       Cardiovascular and Mediastinum   Benign essential hypertension - Primary   Relevant Orders   CBC with Differential/Platelet   Comprehensive metabolic panel   TSH Continue meds as directed     CAD Continue crestor  20mg  qd   Continue asa   Make follow up appt with cardiology        Other   Mixed hyperlipidemia   Relevant Orders    Lipid panel Continue meds and watch diet- decrease fried/fatty foods   Generalized anxiety disorder  Continue meds Other Visit Diagnoses     Vitamin D  deficiency       Relevant Orders  VITAMIN D  25 Hydroxy (Vit-D Deficiency, Fractures)   COPD Continue current inhalers Recommend stop smoking Pt to call back with imaging location to get CT lung screen         Chronic low back pain with spinal stenosis Continue meds       Iron  def anemia Labwork pending Pt to call back to notify which GI specialist she would like to see      Hyperglycemia Hgb A1c normal at 5.0 Continue to watch diet  Need flu shot Flublok given                   No orders of the defined types were placed in this encounter.   Follow-up: Return in about 4 months (around 05/30/2024) for chronic fasting follow-up.    SARA R Hayato Guaman, PA-C

## 2024-01-29 ENCOUNTER — Ambulatory Visit: Payer: Self-pay | Admitting: Physician Assistant

## 2024-01-29 LAB — FE+CBC/D/PLT+TIBC+FER+RETIC
Basophils Absolute: 0 x10E3/uL (ref 0.0–0.2)
Basos: 1 %
EOS (ABSOLUTE): 0.1 x10E3/uL (ref 0.0–0.4)
Eos: 1 %
Ferritin: 41 ng/mL (ref 15–150)
Hematocrit: 37.9 % (ref 34.0–46.6)
Hemoglobin: 12.1 g/dL (ref 11.1–15.9)
Immature Grans (Abs): 0 x10E3/uL (ref 0.0–0.1)
Immature Granulocytes: 0 %
Iron Saturation: 13 % — ABNORMAL LOW (ref 15–55)
Iron: 66 ug/dL (ref 27–159)
Lymphocytes Absolute: 1.6 x10E3/uL (ref 0.7–3.1)
Lymphs: 27 %
MCH: 30.3 pg (ref 26.6–33.0)
MCHC: 31.9 g/dL (ref 31.5–35.7)
MCV: 95 fL (ref 79–97)
Monocytes Absolute: 0.5 x10E3/uL (ref 0.1–0.9)
Monocytes: 9 %
Neutrophils Absolute: 3.8 x10E3/uL (ref 1.4–7.0)
Neutrophils: 61 %
Platelets: 251 x10E3/uL (ref 150–450)
RBC: 4 x10E6/uL (ref 3.77–5.28)
RDW: 13.6 % (ref 11.7–15.4)
Retic Ct Pct: 2 % (ref 0.6–2.6)
Total Iron Binding Capacity: 510 ug/dL — ABNORMAL HIGH (ref 250–450)
UIBC: 444 ug/dL — ABNORMAL HIGH (ref 131–425)
WBC: 6.1 x10E3/uL (ref 3.4–10.8)

## 2024-01-29 LAB — COMPREHENSIVE METABOLIC PANEL WITH GFR
ALT: 11 IU/L (ref 0–32)
AST: 18 IU/L (ref 0–40)
Albumin: 4.7 g/dL (ref 3.8–4.9)
Alkaline Phosphatase: 76 IU/L (ref 49–135)
BUN/Creatinine Ratio: 18 (ref 9–23)
BUN: 17 mg/dL (ref 6–24)
Bilirubin Total: <0.2 mg/dL (ref 0.0–1.2)
CO2: 23 mmol/L (ref 20–29)
Calcium: 9.8 mg/dL (ref 8.7–10.2)
Chloride: 103 mmol/L (ref 96–106)
Creatinine, Ser: 0.92 mg/dL (ref 0.57–1.00)
Globulin, Total: 2.1 g/dL (ref 1.5–4.5)
Glucose: 90 mg/dL (ref 70–99)
Potassium: 4.5 mmol/L (ref 3.5–5.2)
Sodium: 143 mmol/L (ref 134–144)
Total Protein: 6.8 g/dL (ref 6.0–8.5)
eGFR: 72 mL/min/1.73 (ref >59)

## 2024-01-29 LAB — VITAMIN D 25 HYDROXY (VIT D DEFICIENCY, FRACTURES): Vit D, 25-Hydroxy: 36.7 ng/mL (ref 30.0–100.0)

## 2024-02-03 ENCOUNTER — Other Ambulatory Visit: Payer: Self-pay | Admitting: Physician Assistant

## 2024-02-03 DIAGNOSIS — D508 Other iron deficiency anemias: Secondary | ICD-10-CM

## 2024-02-03 DIAGNOSIS — Z1211 Encounter for screening for malignant neoplasm of colon: Secondary | ICD-10-CM

## 2024-02-18 ENCOUNTER — Other Ambulatory Visit: Payer: Self-pay | Admitting: Physician Assistant

## 2024-02-18 ENCOUNTER — Other Ambulatory Visit: Payer: Self-pay

## 2024-02-18 DIAGNOSIS — G8929 Other chronic pain: Secondary | ICD-10-CM

## 2024-02-18 DIAGNOSIS — F411 Generalized anxiety disorder: Secondary | ICD-10-CM

## 2024-02-18 MED ORDER — METHOCARBAMOL 750 MG PO TABS: 750.0000 mg | ORAL_TABLET | Freq: Two times a day (BID) | ORAL | 0 refills | Status: DC | PRN

## 2024-02-19 ENCOUNTER — Ambulatory Visit: Admitting: Physician Assistant

## 2024-03-16 ENCOUNTER — Other Ambulatory Visit: Payer: Self-pay | Admitting: Physician Assistant

## 2024-03-16 DIAGNOSIS — K219 Gastro-esophageal reflux disease without esophagitis: Secondary | ICD-10-CM

## 2024-03-16 DIAGNOSIS — F411 Generalized anxiety disorder: Secondary | ICD-10-CM

## 2024-03-16 MED ORDER — ALPRAZOLAM 1 MG PO TABS: 1.0000 mg | ORAL_TABLET | Freq: Three times a day (TID) | ORAL | 0 refills | Status: DC | PRN

## 2024-03-16 MED ORDER — OMEPRAZOLE 40 MG PO CPDR: DELAYED_RELEASE_CAPSULE | ORAL | 0 refills | Status: AC

## 2024-03-16 NOTE — Telephone Encounter (Signed)
 Copied from CRM [id number]. Topic: Clinical - Medication Refill >> Mar 16, 2024  8:43 AM [name] wrote: Medication:  omeprazole  (PRILOSEC) 40 MG capsule ALPRAZolam  (XANAX ) 1 MG tablet    Has the patient contacted their pharmacy? Yes Pharmacy stated call provider  This is the patient's preferred pharmacy:   [facility] - [place], [place] - [address] [place] [address] Phone: [phone] Fax: [phone]   Is this the correct pharmacy for this prescription? Yes If no, delete pharmacy and type the correct one.   Has the prescription been filled recently? Yes  Is the patient out of the medication? Yes  Has the patient been seen for an appointment in the last year OR does the patient have an upcoming appointment? Yes  Can we respond through MyChart? No  Agent: Please be advised that Rx refills may take up to 3 business days. We ask that you follow-up with your pharmacy.

## 2024-03-18 ENCOUNTER — Other Ambulatory Visit: Payer: Self-pay | Admitting: Physician Assistant

## 2024-03-18 DIAGNOSIS — J438 Other emphysema: Secondary | ICD-10-CM

## 2024-03-18 DIAGNOSIS — F411 Generalized anxiety disorder: Secondary | ICD-10-CM

## 2024-03-18 MED ORDER — FLUTICASONE FUROATE-VILANTEROL 200-25 MCG/ACT IN AEPB: 1.0000 | INHALATION_SPRAY | Freq: Every day | RESPIRATORY_TRACT | 3 refills | Status: DC

## 2024-03-18 NOTE — Telephone Encounter (Signed)
 Copied from CRM #[id number]. Topic: Clinical - Medication Refill >> Mar 18, 2024  8:16 AM Everette C wrote: Medication: fluticasone  furoate-vilanterol (BREO ELLIPTA ) 200-25 MCG/ACT AEPB [[id number]]  ALPRAZolam  (XANAX ) 1 MG tablet [[id number]]  Has the patient contacted their pharmacy? Yes (Agent: If no, request that the patient contact the pharmacy for the refill. If patient does not wish to contact the pharmacy document the reason why and proceed with request.) (Agent: If yes, when and what did the pharmacy advise?)  This is the patient's preferred pharmacy:  [facility] - [place], [place] - [address] [place] [address] Phone: [phone] Fax: [phone]  Is this the correct pharmacy for this prescription? Yes If no, delete pharmacy and type the correct one.   Has the prescription been filled recently? Yes  Is the patient out of the medication? Yes  Has the patient been seen for an appointment in the last year OR does the patient have an upcoming appointment? Yes  Can we respond through MyChart? No  Agent: Please be advised that Rx refills may take up to 3 business days. We ask that you follow-up with your pharmacy.

## 2024-04-13 DIAGNOSIS — G8929 Other chronic pain: Secondary | ICD-10-CM

## 2024-04-15 ENCOUNTER — Other Ambulatory Visit: Payer: Self-pay | Admitting: Physician Assistant

## 2024-04-15 DIAGNOSIS — F411 Generalized anxiety disorder: Secondary | ICD-10-CM

## 2024-04-21 ENCOUNTER — Other Ambulatory Visit: Payer: Self-pay | Admitting: Physician Assistant

## 2024-04-21 DIAGNOSIS — R600 Localized edema: Secondary | ICD-10-CM

## 2024-04-21 DIAGNOSIS — G8929 Other chronic pain: Secondary | ICD-10-CM

## 2024-04-28 ENCOUNTER — Telehealth: Admitting: Physician Assistant

## 2024-04-28 ENCOUNTER — Ambulatory Visit: Payer: Self-pay

## 2024-04-28 ENCOUNTER — Encounter: Payer: Self-pay | Admitting: Physician Assistant

## 2024-04-28 ENCOUNTER — Other Ambulatory Visit: Payer: Self-pay

## 2024-04-28 VITALS — Ht 61.0 in | Wt 159.0 lb

## 2024-04-28 DIAGNOSIS — J438 Other emphysema: Secondary | ICD-10-CM

## 2024-04-28 DIAGNOSIS — J209 Acute bronchitis, unspecified: Secondary | ICD-10-CM

## 2024-04-28 DIAGNOSIS — J440 Chronic obstructive pulmonary disease with acute lower respiratory infection: Secondary | ICD-10-CM | POA: Diagnosis not present

## 2024-04-28 MED ORDER — FLUTICASONE FUROATE-VILANTEROL 200-25 MCG/ACT IN AEPB: 1.0000 | INHALATION_SPRAY | Freq: Every day | RESPIRATORY_TRACT | 3 refills | Status: AC

## 2024-04-28 MED ORDER — AZITHROMYCIN 250 MG PO TABS: ORAL_TABLET | ORAL | 0 refills | Status: AC

## 2024-04-28 MED ORDER — DEXTROMETHORPHAN POLISTIREX ER 30 MG/5ML PO SUER: 30.0000 mg | Freq: Two times a day (BID) | ORAL | 0 refills | Status: AC

## 2024-04-28 MED ORDER — PREDNISONE 20 MG PO TABS: ORAL_TABLET | ORAL | 0 refills | Status: AC

## 2024-04-28 MED ORDER — ALBUTEROL SULFATE HFA 108 (90 BASE) MCG/ACT IN AERS: 1.0000 | INHALATION_SPRAY | Freq: Three times a day (TID) | RESPIRATORY_TRACT | 3 refills | Status: DC | PRN

## 2024-04-28 MED ORDER — ALBUTEROL SULFATE HFA 108 (90 BASE) MCG/ACT IN AERS: 1.0000 | INHALATION_SPRAY | Freq: Three times a day (TID) | RESPIRATORY_TRACT | 3 refills | Status: AC | PRN

## 2024-04-28 NOTE — Telephone Encounter (Signed)
 FYI Only or Action Required?: FYI only for provider: appointment scheduled on today-virtual due to location from clinic.  Patient was last seen in primary care on 01/28/2024 by Nicholaus Credit, PA-C.  Called Nurse Triage reporting Cough and Sore Throat.  Symptoms began yesterday.  Interventions attempted: Rest and hydration. Calling pharmacy for inhaler refills  Symptoms are: unchanged.  Triage Disposition: See HCP Within 4 Hours (Or PCP Triage)  Patient/caregiver understands and will follow disposition?: Yes   Reason for Disposition  Wheezing is present  Answer Assessment - Initial Assessment Questions Additional info:  Patient calling to schedule virtual appointment with pcp to request antibiotic for <24 hour of sore throat and cough with fever. Symptoms started last evening of sore throat, overnight progressed to cough with intermittent wheezing, and fever 100.1. Voice is hoarse, speaking in full sentences on call. She is out of her Ranell and misplaced her albuterol , she has refills and calling to fill them after this triage call.  Patient is 3.5 hours from office and requesting a virtual visit, she is aware of limitations of virtual visit and they she may be asked to proceed to local walk in clinic for in person exam. Scheduled virtual visit with alternate provider.   1. ONSET: When did the cough begin?      04/27/24-started as sore throat  2. SEVERITY: How bad is the cough today?      Very frequent  3. SPUTUM: Describe the color of your sputum (e.g., none, dry cough; clear, white, yellow, green)     White  4. HEMOPTYSIS: Are you coughing up any blood? If Yes, ask: How much? (e.g., flecks, streaks, tablespoons, etc.)     Denies  5. DIFFICULTY BREATHING: Are you having difficulty breathing? If Yes, ask: How bad is it? (e.g., mild, moderate, severe)      Shortness of breath-she has misplaced her albuterol  6. FEVER: Do you have a fever? If Yes, ask: What is your  temperature, how was it measured, and when did it start?     100.1 7. CARDIAC HISTORY: Do you have any history of heart disease? (e.g., heart attack, congestive heart failure)       8. LUNG HISTORY: Do you have any history of lung disease?  (e.g., pulmonary embolus, asthma, emphysema)     COPD, emphysema  9. PE RISK FACTORS: Do you have a history of blood clots? (or: recent major surgery, recent prolonged travel, bedridden)      10. OTHER SYMPTOMS: Do you have any other symptoms? (e.g., runny nose, wheezing, chest pain)       Body aches, severe sore throat, chest congestion  Protocols used: Cough - Acute Productive-A-AH Message from [facility] sent at 04/28/2024  8:36 AM EST  Reason for Triage: Patient c/o fever 100.1, body aches, nasal congestion, wheezing, issues breathing, severe cough, severe sore throat that started last night. Has known emphysema. Ran out of her brieo and albuterol  inhaler. I did send refill request but also advised her she has refills and to contact pharmacy.

## 2024-04-28 NOTE — Progress Notes (Signed)
 "   Virtual Visit via Video Note   This visit type was conducted per patient request This format is felt to be appropriate for this patient at this time.  All issues noted in this document were discussed and addressed.  A limited physical exam was performed with this format.  A verbal consent was obtained for the virtual visit.   Date:  04/28/2024   ID:  Kaitlyn Diaz, DOB Apr 19, 1965, MRN [id number]  Patient Location: Home Provider Location: Office/Clinic  PCP:  Nicholaus Credit, PA-C   Chief Complaint  Patient presents with   Cough     History of Present Illness:    Discussed the use of AI scribe software for clinical note transcription with the patient, who gave verbal consent to proceed.  History of Present Illness Kaitlyn Diaz is a 59 year old female with emphysema who presents with worsening cough and respiratory symptoms.  She has experienced a worsening cough that began last night, accompanied by a sore throat, body aches, headache, chest congestion, and nasal congestion. She notes a low-grade fever. She is worried about getting pneumonia because of her history of emphysema.  She has a history of emphysema and has received both the flu and pneumonia vaccinations. She currently uses Breo and albuterol  inhalers to manage her symptoms. She took Breo this morning and has three refills left on her albuterol  inhaler, but it has not been filled at her current pharmacy. She reports occasional wheezing and is concerned about her breathing due to her emphysema. She does not have a pulse oximeter but uses a blood pressure kit to monitor her health.  She recalls having a similar respiratory flare last winter, which was managed with antibiotics. She is allergic to amoxicillin and penicillin but has no issues with oral steroids like prednisone . She requests a refill of her albuterol  inhaler and mentions a past use of Tessalon  Perles for cough relief. She also notes a previous allergy to codeine,  which she no longer has.  In terms of her social history, she has moved three and a half hours away from her previous provider. Her daughter has a nebulizer that she could potentially use if needed. She has stocked up on over-the-counter medications such as orange juice, Halls, Mucinex, and children's Motrin to manage her symptoms at home.        Past Medical History:  Diagnosis Date   Abnormal bruising 11/06/2022   Abnormal EKG 12/27/2022   Abnormal mammogram 12/10/2013   Absolute anemia 05/30/2022   Acquired hallux rigidus of left foot 06/09/2021   Acute laryngopharyngitis 07/13/2020   Anemia    Anxiety    panic attacks, claustophobic on xanax    Aortic atherosclerosis 09/03/2021   Arthritis    Arthritis of carpometacarpal joint 07/09/2014   Asthma    Benign essential hypertension 12/10/2013   Bilateral hand numbness 10/08/2022   Brachial neuritis or radiculitis 12/10/2013   Bunion of right foot 06/09/2021   CAD, elevated calcium  score 439, severe proximal to mid LAD lesion, difficult to assess given calcification. 01/25/2023   Cervical radiculopathy 12/10/2013   Chest pain of uncertain etiology 12/27/2022   Chronic left-sided low back pain with left-sided sciatica 11/15/2020   Complication of anesthesia    Difficult to put to sleep, Has run fever, runs in famly   Compression fracture of L1 lumbar vertebra, closed, initial encounter (HCC) 01/04/2023   Contusion of left leg 11/08/2022   COPD (chronic obstructive pulmonary disease) (HCC)    DDD (  degenerative disc disease), cervical 12/10/2013   DDD (degenerative disc disease), lumbosacral 12/10/2013   Depressive disorder 05/06/2013   STORY: cont INderal LA for one week, then switch to Inderal 40mg  1 a day   for 2 weeks, then stop.     Displacement of lumbar intervertebral disc without myelopathy 01/27/2013   Enlarged heart    hx of   Esophageal reflux 12/10/2013   Essential (primary) hypertension    Essential tremor  05/06/2013   Formatting of this note might be different from the original.  STORY: due to depression on Inderal will taper it and try topirimate,  ADR discussed.  Will not use primidone due to risk of sedation.     Family history of anesthesia complication    malignant hyperthermia on mother's side of family   Female stress incontinence 12/10/2013   Generalized anxiety disorder    GERD (gastroesophageal reflux disease)    H/O hiatal hernia    hx of   Hematochezia 12/10/2013   Hidradenitis 12/10/2013   History of melanoma    Hypertension    Dr. Rexene Crazier, medical physician [phone]   Low back pain 12/10/2013   Formatting of this note might be different from the original.  Overview:   IMO Problem List Replacer Jan. 2016  Formatting of this note might be different from the original.  IMO Problem List Replacer Jan. 2016     Lumbosacral spondylosis without myelopathy 12/10/2013   Major depressive disorder, single episode, unspecified 05/06/2013   Formatting of this note might be different from the original.  Overview:   STORY: cont INderal LA for one week, then switch to Inderal 40mg  1 a day for 2 weeks, then stop.  Formatting of this note might be different from the original.  STORY: cont INderal LA for one week, then switch to Inderal 40mg  1 a day for 2 weeks, then stop.     Malignant hyperthermia    Menopausal and postmenopausal disorder 12/10/2013   Metatarsalgia of both feet 06/09/2021   Mixed hyperlipidemia    Need for tetanus, diphtheria, and acellular pertussis (Tdap) vaccine 07/13/2020   Neuromuscular disorder (HCC)    85 % nerve damage in left leg   Other emphysema (HCC) 09/03/2021   Other fatigue 05/30/2022   Palpitations 01/07/2023   Panic disorder without agoraphobia 12/10/2013   Pilonidal cyst 12/10/2013   Pneumonia    hx of pneumonia x 2   Polyarthralgia 12/06/2015   PONV (postoperative nausea and vomiting)    Recurrent falls 01/07/2023   RLS (restless legs  syndrome) 12/10/2013   Spinal headache    Spondylosis of cervical region without myelopathy or radiculopathy 12/03/2017   Syncope and collapse 01/07/2023   Tobacco abuse 05/30/2022   Urinary tract infection    and kidney infection Hx of   Vitamin D  deficiency 09/03/2021    Past Surgical History:  Procedure Laterality Date   ANTERIOR CERVICAL DECOMP/DISCECTOMY FUSION  12/28/2011   Procedure: ANTERIOR CERVICAL DECOMPRESSION/DISCECTOMY FUSION 2 LEVELS;  Surgeon: Arley SHAUNNA Helling, MD;  Location: [facility];  Service: Neurosurgery;  Laterality: N/A;  HISTORY OF MALIGNANT HYPERTHERMIA Anterior Cervical Discectomy Fusion of Cervical five-six, Cervical six-seven   BLADDER SUSPENSION     BREAST SURGERY     left breast mass removed   hand mass     1994, multiple masses removed - all benign   LEFT HEART CATH AND CORONARY ANGIOGRAPHY N/A 02/01/2023   Procedure: LEFT HEART CATH AND CORONARY ANGIOGRAPHY;  Surgeon: Jordan, Peter  M, MD;  Location: [facility];  Service: Cardiovascular;  Laterality: N/A;   leg mass     left leg  surgically removed   POSTERIOR FUSION LUMBAR SPINE     L4/5   TONSILLECTOMY     and adnoids   TUBAL LIGATION      Family History  Problem Relation Age of Onset   Colon cancer Mother    Stroke Maternal Grandmother    Stomach cancer Maternal Grandfather    Breast cancer Neg Hx     Social History   Socioeconomic History   Marital status: Married    Spouse name: Not on file   Number of children: 2   Years of education: Not on file   Highest education level: GED or equivalent  Occupational History   Occupation: Disabled  Tobacco Use   Smoking status: Every Day    Average packs/day: 0.9 packs/day for 78.0 years (69.5 ttl pk-yrs)    Types: Cigarettes    Start date: 1980   Smokeless tobacco: Never  Vaping Use   Vaping status: Some Days   Substances: Nicotine , Flavoring  Substance and Sexual Activity   Alcohol use: Not Currently    Comment: Drinks  occasionally, and consumes wine.   Drug use: No   Sexual activity: Not Currently  Other Topics Concern   Not on file  Social History Narrative   Not on file   Social Drivers of Health   Tobacco Use: High Risk (01/28/2024)   Patient History    Smoking Tobacco Use: Every Day    Smokeless Tobacco Use: Never    Passive Exposure: Not on file  Financial Resource Strain: Low Risk (11/06/2022)   Overall Financial Resource Strain (CARDIA)    Difficulty of Paying Living Expenses: Not hard at all  Food Insecurity: No Food Insecurity (11/14/2022)   Hunger Vital Sign    Worried About Running Out of Food in the Last Year: Never true    Ran Out of Food in the Last Year: Never true  Transportation Needs: No Transportation Needs (11/14/2022)   PRAPARE - Administrator, Civil Service (Medical): No    Lack of Transportation (Non-Medical): No  Physical Activity: Inactive (11/06/2022)   Exercise Vital Sign    Days of Exercise per Week: 0 days    Minutes of Exercise per Session: 0 min  Stress: No Stress Concern Present (11/06/2022)   [employer] of Occupational Health - Occupational Stress Questionnaire    Feeling of Stress : Not at all  Social Connections: Moderately Isolated (11/06/2022)   Social Connection and Isolation Panel    Frequency of Communication with Friends and Family: More than three times a week    Frequency of Social Gatherings with Friends and Family: More than three times a week    Attends Religious Services: Never    Database Administrator or Organizations: No    Attends Banker Meetings: Never    Marital Status: Married  Catering Manager Violence: Not At Risk (01/04/2023)   Received from [facility]   HITS    Over the last 12 months how often did your partner physically hurt you?: Never    Over the last 12 months how often did your partner insult you or talk down to you?: Never    Over the last 12 months how often did your partner threaten you with  physical harm?: Never    Over the last 12 months how often did your  partner scream or curse at you?: Never  Depression (PHQ2-9): Medium Risk (01/28/2024)   Depression (PHQ2-9)    PHQ-2 Score: 6  Alcohol Screen: Low Risk (11/14/2022)   Alcohol Screen    Last Alcohol Screening Score (AUDIT): 0  Housing: Low Risk (11/14/2022)   Housing    Last Housing Risk Score: 0  Utilities: Not At Risk (11/14/2022)   AHC Utilities    Threatened with loss of utilities: No  Health Literacy: Adequate Health Literacy (11/06/2022)   B1300 Health Literacy    Frequency of need for help with medical instructions: Never    Outpatient Medications Prior to Visit  Medication Sig Dispense Refill   albuterol  (VENTOLIN  HFA) 108 (90 Base) MCG/ACT inhaler Inhale 1 puff into the lungs 3 (three) times daily as needed. For shortness of breath 1 each 3   ALPRAZolam  (XANAX ) 1 MG tablet Take 1 tablet (1 mg total) by mouth 3 (three) times daily as needed for anxiety. 90 tablet 0   amLODipine  (NORVASC ) 10 MG tablet Take 1 tablet (10 mg total) by mouth daily. 90 tablet 0   ammonium lactate (AMLACTIN) 12 % cream Apply 1 Application topically as needed for dry skin.     aspirin  EC 81 MG tablet Take 81 mg by mouth daily. Swallow whole.     buPROPion  (WELLBUTRIN  XL) 150 MG 24 hr tablet Take 1 tablet (150 mg total) by mouth daily. 90 tablet 0   busPIRone  (BUSPAR ) 15 MG tablet Take 1 tablet (15 mg total) by mouth 2 (two) times daily. 180 tablet 0   DULoxetine  (CYMBALTA ) 60 MG capsule Take 1 capsule (60 mg total) by mouth 2 (two) times daily. 180 capsule 0   fenofibrate  micronized (LOFIBRA) 134 MG capsule Take 1 capsule (134 mg total) by mouth daily before breakfast. TAKE 1 CAPSULE BY MOUTH ONCE DAILY 90 capsule 0   fluticasone  furoate-vilanterol (BREO ELLIPTA ) 200-25 MCG/ACT AEPB Inhale 1 puff into the lungs daily. 1 each 3   folic acid  (FOLVITE ) 400 MCG tablet Take 400 mcg by mouth daily.     furosemide  (LASIX ) 40 MG tablet Take 1 tablet  by mouth once daily for fluid. 90 tablet 0   lisinopril  (ZESTRIL ) 10 MG tablet Take 1 tablet (10 mg total) by mouth daily. 90 tablet 0   meloxicam  (MOBIC ) 7.5 MG tablet Take 1 tablet (7.5 mg total) by mouth daily. 90 tablet 0   methocarbamol  (ROBAXIN ) 750 MG tablet Take 1 tablet (750 mg total) by mouth 2 (two) times daily as needed for muscle spasms. 60 tablet 0   omeprazole  (PRILOSEC) 40 MG capsule TAKE 1 CAPSULE BY MOUTH EVERY MORNING BEFORE BREAKFAST 90 capsule 0   potassium chloride  (KLOR-CON  M) 10 MEQ tablet Take 1 tablet (10 mEq total) by mouth daily. 30 tablet 1   pregabalin  (LYRICA ) 200 MG capsule TAKE 1 CAPSULE BY MOUTH THREE TIMES A DAY 90 capsule 0   rosuvastatin  (CRESTOR ) 20 MG tablet Take 1 tablet (20 mg total) by mouth daily. 90 tablet 0   No facility-administered medications prior to visit.    Allergies[1]   Social History[2]   Review of Systems  Constitutional:  Negative for chills and malaise/fatigue.  HENT:  Negative for congestion, ear pain and sore throat.   Respiratory:  Positive for cough and shortness of breath.   Cardiovascular:  Negative for chest pain.  Gastrointestinal:  Negative for abdominal pain, constipation, diarrhea and vomiting.  Genitourinary:  Negative for dysuria.  Musculoskeletal:  Negative for joint pain  and myalgias.  Skin:  Negative for rash.  Neurological:  Negative for weakness and headaches.  Psychiatric/Behavioral:  The patient is not nervous/anxious.      Labs/Other Tests and Data Reviewed:    Recent Labs: 10/15/2023: TSH 1.640 10/31/2023: BNP 71.6 01/28/2024: ALT 11; BUN 17; Creatinine, Ser 0.92; Hemoglobin 12.1; Platelets 251; Potassium 4.5; Sodium 143   Recent Lipid Panel Lab Results  Component Value Date/Time   CHOL 129 10/15/2023 10:52 AM   TRIG 145 10/15/2023 10:52 AM   HDL 44 10/15/2023 10:52 AM   CHOLHDL 2.9 10/15/2023 10:52 AM   LDLCALC 60 10/15/2023 10:52 AM    Wt Readings from Last 3 Encounters:  04/28/24 159 lb  (72.1 kg)  01/28/24 159 lb (72.1 kg)  10/31/23 161 lb (73 kg)     Objective:    Vital Signs:  Ht 5' 1 (1.549 m)   Wt 159 lb (72.1 kg)   LMP 06/03/2011   BMI 30.04 kg/m    Physical Exam   Unable to perform due to it being a video visit.   ASSESSMENT & PLAN:    Assessment & Plan Other emphysema (HCC) Emphysema Chronic emphysema with recent exacerbation. Risk of hospitalization due to past pneumonia. Current inhalers effective but albuterol  causes jitteriness. - Ensure Breo and albuterol  inhalers are refilled and used as needed. - Consider nebulizer use if inhalers are insufficient. - Monitor for worsening symptoms and contact healthcare provider if condition deteriorates. Orders:   albuterol  (VENTOLIN  HFA) 108 (90 Base) MCG/ACT inhaler; Inhale 1 puff into the lungs 3 (three) times daily as needed. For shortness of breath  Acute bronchitis with COPD (HCC) Acute bronchitis with chronic obstructive pulmonary disease (COPD) Acute bronchitis with COPD exacerbation. Risk of pneumonia due to emphysema. Differential includes viral or bacterial infection. Previous antibiotics doxycycline and azithromycin  considered. Oral prednisone  considered for respiratory symptoms. - Prescribed azithromycin  based on previous consideration. - Prescribed oral prednisone  for respiratory symptoms. - Refilled albuterol  inhaler prescription. - Prescribed cough medicine. - Advised nebulizer use if symptoms worsen and inhalers are insufficient. Orders:   predniSONE  (DELTASONE ) 20 MG tablet; Take 3 tablets (60 mg total) by mouth daily with breakfast for 3 days, THEN 2 tablets (40 mg total) daily with breakfast for 3 days, THEN 1 tablet (20 mg total) daily with breakfast for 3 days.   dextromethorphan  (ROBITUSSIN 12 HOUR COUGH) 30 MG/5ML liquid; Take 5 mLs (30 mg total) by mouth 2 (two) times daily.   azithromycin  (ZITHROMAX ) 250 MG tablet; Take 2 tablets on day 1, then 1 tablet daily on days 2 through 5      No orders of the defined types were placed in this encounter.   Follow Up:  In Person prn   I,Lauren M Auman,acting as a scribe for [employer], PA.,have documented all relevant documentation on the behalf of Nola Angles, PA,as directed by  Nola Angles, PA while in the presence of Nola Angles, [place].      [1]  Allergies Allergen Reactions   Amoxicillin Anaphylaxis, Itching and Swelling   Penicillins Itching and Swelling    Throat swelling   Codeine Other (See Comments)    nausea   Oxycodone  Other (See Comments)    Hallucinations   Oxycodone  Hcl Other (See Comments)    hallucinations   Oxycodone  Hcl   [2]  Social History Tobacco Use   Smoking status: Every Day    Average packs/day: 0.9 packs/day for 78.0 years (69.5 ttl pk-yrs)    Types: Cigarettes  Start date: 71   Smokeless tobacco: Never  Vaping Use   Vaping status: Some Days   Substances: Nicotine , Flavoring  Substance Use Topics   Alcohol use: Not Currently    Comment: Drinks occasionally, and consumes wine.   Drug use: No   "

## 2024-04-28 NOTE — Telephone Encounter (Signed)
 Copied from CRM [id number]. Topic: Clinical - Medication Refill >> Apr 28, 2024  8:31 AM Amber H wrote: Medication:   fluticasone  furoate-vilanterol (BREO ELLIPTA ) 200-25 MCG/ACT AEPB albuterol  (VENTOLIN  HFA) 108 (90 Base) MCG/ACT inhaler  Has the patient contacted their pharmacy? No- I did advise her she has refills and to contact pharmacy  (Agent: If no, request that the patient contact the pharmacy for the refill. If patient does not wish to contact the pharmacy document the reason why and proceed with request.) (Agent: If yes, when and what did the pharmacy advise?)  This is the patient's preferred pharmacy:  [facility] - [place], [place] - [address] [place] [address] Phone: [phone] Fax: [phone]   Is this the correct pharmacy for this prescription? Yes If no, delete pharmacy and type the correct one.   Has the prescription been filled recently? Yes  Is the patient out of the medication? Yes  Has the patient been seen for an appointment in the last year OR does the patient have an upcoming appointment? Yes, 01/28/2024  Can we respond through MyChart? No  Agent: Please be advised that Rx refills may take up to 3 business days. We ask that you follow-up with your pharmacy.

## 2024-04-28 NOTE — Assessment & Plan Note (Signed)
 Emphysema Chronic emphysema with recent exacerbation. Risk of hospitalization due to past pneumonia. Current inhalers effective but albuterol  causes jitteriness. - Ensure Breo and albuterol  inhalers are refilled and used as needed. - Consider nebulizer use if inhalers are insufficient. - Monitor for worsening symptoms and contact healthcare provider if condition deteriorates. Orders:   albuterol  (VENTOLIN  HFA) 108 (90 Base) MCG/ACT inhaler; Inhale 1 puff into the lungs 3 (three) times daily as needed. For shortness of breath

## 2024-04-30 ENCOUNTER — Ambulatory Visit

## 2024-05-14 ENCOUNTER — Other Ambulatory Visit: Payer: Self-pay | Admitting: Physician Assistant

## 2024-05-14 DIAGNOSIS — F411 Generalized anxiety disorder: Secondary | ICD-10-CM

## 2024-05-14 DIAGNOSIS — I10 Essential (primary) hypertension: Secondary | ICD-10-CM

## 2024-06-01 ENCOUNTER — Ambulatory Visit: Admitting: Physician Assistant
